# Patient Record
Sex: Female | Born: 1950 | Race: White | Hispanic: No | Marital: Married | Smoking: Former smoker
Health system: Southern US, Community
[De-identification: ages and names within clinical notes are randomized; demographics above are authoritative.]

## PROBLEM LIST (undated history)

## (undated) DIAGNOSIS — IMO0001 Reserved for inherently not codable concepts without codable children: Secondary | ICD-10-CM

## (undated) DIAGNOSIS — J449 Chronic obstructive pulmonary disease, unspecified: Secondary | ICD-10-CM

## (undated) DIAGNOSIS — B019 Varicella without complication: Secondary | ICD-10-CM

## (undated) DIAGNOSIS — I824Z1 Acute embolism and thrombosis of unspecified deep veins of right distal lower extremity: Secondary | ICD-10-CM

## (undated) DIAGNOSIS — A630 Anogenital (venereal) warts: Secondary | ICD-10-CM

## (undated) DIAGNOSIS — R918 Other nonspecific abnormal finding of lung field: Secondary | ICD-10-CM

## (undated) DIAGNOSIS — S83249A Other tear of medial meniscus, current injury, unspecified knee, initial encounter: Secondary | ICD-10-CM

## (undated) DIAGNOSIS — K635 Polyp of colon: Secondary | ICD-10-CM

## (undated) DIAGNOSIS — T8859XA Other complications of anesthesia, initial encounter: Secondary | ICD-10-CM

## (undated) DIAGNOSIS — N390 Urinary tract infection, site not specified: Secondary | ICD-10-CM

## (undated) DIAGNOSIS — M8580 Other specified disorders of bone density and structure, unspecified site: Secondary | ICD-10-CM

## (undated) DIAGNOSIS — T4145XA Adverse effect of unspecified anesthetic, initial encounter: Secondary | ICD-10-CM

## (undated) DIAGNOSIS — E079 Disorder of thyroid, unspecified: Secondary | ICD-10-CM

## (undated) HISTORY — DX: Varicella without complication: B01.9

## (undated) HISTORY — DX: Polyp of colon: K63.5

## (undated) HISTORY — DX: Anogenital (venereal) warts: A63.0

## (undated) HISTORY — PX: BREAST BIOPSY: SHX20

## (undated) HISTORY — PX: COLONOSCOPY: SHX174

## (undated) HISTORY — DX: Disorder of thyroid, unspecified: E07.9

## (undated) HISTORY — DX: Other specified disorders of bone density and structure, unspecified site: M85.80

## (undated) HISTORY — DX: Urinary tract infection, site not specified: N39.0

---
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
## 1996-11-22 HISTORY — PX: THYROIDECTOMY, PARTIAL: SHX18

## 2009-05-03 ENCOUNTER — Emergency Department (HOSPITAL_COMMUNITY): Admission: EM | Admit: 2009-05-03 | Discharge: 2009-05-03 | Payer: Self-pay | Admitting: Emergency Medicine

## 2014-05-14 ENCOUNTER — Other Ambulatory Visit: Payer: Self-pay | Admitting: Obstetrics and Gynecology

## 2014-05-14 DIAGNOSIS — N644 Mastodynia: Secondary | ICD-10-CM

## 2014-05-27 ENCOUNTER — Ambulatory Visit
Admission: RE | Admit: 2014-05-27 | Discharge: 2014-05-27 | Disposition: A | Discharge status: Home / Self Care | Payer: Medicare Other | Source: Ambulatory Visit | Attending: Obstetrics and Gynecology | Admitting: Obstetrics and Gynecology

## 2014-05-27 DIAGNOSIS — N644 Mastodynia: Secondary | ICD-10-CM

## 2014-05-31 ENCOUNTER — Other Ambulatory Visit: Payer: Self-pay | Admitting: Obstetrics and Gynecology

## 2014-05-31 DIAGNOSIS — R921 Mammographic calcification found on diagnostic imaging of breast: Secondary | ICD-10-CM

## 2014-06-03 ENCOUNTER — Other Ambulatory Visit: Payer: Self-pay

## 2014-06-03 ENCOUNTER — Other Ambulatory Visit: Payer: Self-pay | Admitting: Obstetrics and Gynecology

## 2014-06-03 DIAGNOSIS — R921 Mammographic calcification found on diagnostic imaging of breast: Secondary | ICD-10-CM

## 2014-06-06 ENCOUNTER — Ambulatory Visit
Admission: RE | Admit: 2014-06-06 | Discharge: 2014-06-06 | Disposition: A | Discharge status: Home / Self Care | Payer: Medicare Other | Source: Ambulatory Visit | Attending: Obstetrics and Gynecology | Admitting: Obstetrics and Gynecology

## 2014-06-06 DIAGNOSIS — R921 Mammographic calcification found on diagnostic imaging of breast: Secondary | ICD-10-CM

## 2014-07-01 ENCOUNTER — Other Ambulatory Visit: Payer: Self-pay | Admitting: Internal Medicine

## 2014-07-01 ENCOUNTER — Other Ambulatory Visit: Payer: Self-pay | Admitting: Obstetrics and Gynecology

## 2014-07-01 DIAGNOSIS — E041 Nontoxic single thyroid nodule: Secondary | ICD-10-CM

## 2014-07-02 LAB — CYTOLOGY - PAP

## 2014-07-04 ENCOUNTER — Other Ambulatory Visit: Payer: Medicare Other

## 2014-07-04 ENCOUNTER — Ambulatory Visit
Admission: RE | Admit: 2014-07-04 | Discharge: 2014-07-04 | Disposition: A | Discharge status: Home / Self Care | Payer: Medicare Other | Source: Ambulatory Visit | Attending: Internal Medicine | Admitting: Internal Medicine

## 2014-07-04 DIAGNOSIS — E041 Nontoxic single thyroid nodule: Secondary | ICD-10-CM

## 2014-08-13 ENCOUNTER — Encounter: Payer: Self-pay | Admitting: Internal Medicine

## 2014-08-20 ENCOUNTER — Ambulatory Visit (AMBULATORY_SURGERY_CENTER): Payer: Self-pay | Admitting: *Deleted

## 2014-08-20 VITALS — Ht 69.0 in | Wt 170.0 lb

## 2014-08-20 DIAGNOSIS — Z1211 Encounter for screening for malignant neoplasm of colon: Secondary | ICD-10-CM

## 2014-08-20 MED ORDER — MOVIPREP 100 G PO SOLR: ORAL | Status: DC

## 2014-08-20 NOTE — Progress Notes (Signed)
No allergies to eggs or soy. No problems with anesthesia.  No oxygen use  No diet drug use  

## 2014-08-29 ENCOUNTER — Encounter: Payer: Self-pay | Admitting: Internal Medicine

## 2014-09-02 ENCOUNTER — Ambulatory Visit (AMBULATORY_SURGERY_CENTER): Payer: Medicare Other | Admitting: Internal Medicine

## 2014-09-02 ENCOUNTER — Encounter: Payer: Self-pay | Admitting: Internal Medicine

## 2014-09-02 VITALS — BP 161/74 | HR 64 | Temp 98.0°F | Resp 50 | Ht 69.0 in | Wt 170.0 lb

## 2014-09-02 DIAGNOSIS — D122 Benign neoplasm of ascending colon: Secondary | ICD-10-CM

## 2014-09-02 DIAGNOSIS — Z1211 Encounter for screening for malignant neoplasm of colon: Secondary | ICD-10-CM

## 2014-09-02 MED ORDER — SODIUM CHLORIDE 0.9 % IV SOLN: 500.0000 mL | INTRAVENOUS | Status: DC

## 2014-09-02 NOTE — Progress Notes (Signed)
Procedure ends, to recovery, report given and VSS. 

## 2014-09-02 NOTE — Progress Notes (Signed)
Patient denies any allergies to eggs or soy. Patient denies any problems with anesthesia/sedation. Patient denies any oxygen use at home.

## 2014-09-02 NOTE — Op Note (Signed)
[facility]  [address] [place], [id number]   COLONOSCOPY PROCEDURE REPORT  PATIENT: Candace Frey, Candace Frey  MR#: [id number] BIRTHDATE: Sep 29, 1951 , 6  yrs. old GENDER: female ENDOSCOPIST: Jerene Bears, MD PROCEDURE DATE:  09/02/2014 PROCEDURE:   Colonoscopy with snare polypectomy First Screening Colonoscopy - Avg.  risk and is 50 yrs.  old or older - No.  Prior Negative Screening - Now for repeat screening. 10 or more years since last screening  History of Adenoma - Now for follow-up colonoscopy & has been > or = to 3 yrs.  N/A  Polyps Removed Today? Yes. ASA CLASS:   Class II INDICATIONS:average risk for colorectal cancer and last colonoscopy completed 2004 (normal). MEDICATIONS: Monitored anesthesia care and Propofol 240 mg IV  DESCRIPTION OF PROCEDURE:   After the risks benefits and alternatives of the procedure were thoroughly explained, informed consent was obtained.  The digital rectal exam revealed no rectal mass.   The LB [id number]-H190 [id number]  endoscope was introduced through the anus and advanced to the cecum, which was identified by both the appendix and ileocecal valve. No adverse events experienced. The quality of the prep was good, using MoviPrep  The instrument was then slowly withdrawn as the colon was fully examined.   COLON FINDINGS: A sessile polyp measuring 5 mm in size was found in the ascending colon.  A polypectomy was performed with a cold snare.  The resection was complete, the polyp tissue was completely retrieved and sent to histology.   There was mild diverticulosis noted in the sigmoid colon.  Retroflexed views revealed no abnormalities. The time to cecum=6 minutes 04 seconds.  Withdrawal time=8 minutes 05 seconds.  The scope was withdrawn and the procedure completed. COMPLICATIONS: There were no immediate complications.  ENDOSCOPIC IMPRESSION: 1.   Sessile polyp was found in the ascending colon; polypectomy was performed with a  cold snare 2.   Mild diverticulosis was noted in the sigmoid colon  RECOMMENDATIONS: 1.  Await pathology results 2.  If the polyp removed today is proven to be an adenomatous (pre-cancerous) polyp, you will need a repeat colonoscopy in 5 years.  Otherwise you should continue to follow colorectal cancer screening guidelines for "routine risk" patients with colonoscopy in 10 years.  You will receive a letter within 1-2 weeks with the results of your biopsy as well as final recommendations.  Please call my office if you have not received a letter after 3 weeks.  eSigned:  Jerene Bears, MD 09/02/2014 1:56 PM   cc: The Patient; Velna Hatchet, MD

## 2014-09-02 NOTE — Progress Notes (Signed)
Called to room to assist during endoscopic procedure.  Patient ID and intended procedure confirmed with present staff. Received instructions for my participation in the procedure from the performing physician.  

## 2014-09-02 NOTE — Patient Instructions (Signed)
Discharge instructions given with verbal understanding. Handouts on polyps and diverticulosis. Resume previous medications. YOU HAD AN ENDOSCOPIC PROCEDURE TODAY AT THE [facility]: Refer to the procedure report that was given to you for any specific questions about what was found during the examination.  If the procedure report does not answer your questions, please call your gastroenterologist to clarify.  If you requested that your care partner not be given the details of your procedure findings, then the procedure report has been included in a sealed envelope for you to review at your convenience later.  YOU SHOULD EXPECT: Some feelings of bloating in the abdomen. Passage of more gas than usual.  Walking can help get rid of the air that was put into your GI tract during the procedure and reduce the bloating. If you had a lower endoscopy (such as a colonoscopy or flexible sigmoidoscopy) you may notice spotting of blood in your stool or on the toilet paper. If you underwent a bowel prep for your procedure, then you may not have a normal bowel movement for a few days.  DIET: Your first meal following the procedure should be a light meal and then it is ok to progress to your normal diet.  A half-sandwich or bowl of soup is an example of a good first meal.  Heavy or fried foods are harder to digest and may make you feel nauseous or bloated.  Likewise meals heavy in dairy and vegetables can cause extra gas to form and this can also increase the bloating.  Drink plenty of fluids but you should avoid alcoholic beverages for 24 hours.  ACTIVITY: Your care partner should take you home directly after the procedure.  You should plan to take it easy, moving slowly for the rest of the day.  You can resume normal activity the day after the procedure however you should NOT DRIVE or use heavy machinery for 24 hours (because of the sedation medicines used during the test).    SYMPTOMS TO REPORT  IMMEDIATELY: A gastroenterologist can be reached at any hour.  During normal business hours, 8:30 AM to 5:00 PM Monday through Friday, call [phone].  After hours and on weekends, please call the GI answering service at [phone] who will take a message and have the physician on call contact you.   Following lower endoscopy (colonoscopy or flexible sigmoidoscopy):  Excessive amounts of blood in the stool  Significant tenderness or worsening of abdominal pains  Swelling of the abdomen that is new, acute  Fever of 100F or higher  FOLLOW UP: If any biopsies were taken you will be contacted by phone or by letter within the next 1-3 weeks.  Call your gastroenterologist if you have not heard about the biopsies in 3 weeks.  Our staff will call the home number listed on your records the next business day following your procedure to check on you and address any questions or concerns that you may have at that time regarding the information given to you following your procedure. This is a courtesy call and so if there is no answer at the home number and we have not heard from you through the emergency physician on call, we will assume that you have returned to your regular daily activities without incident.  SIGNATURES/CONFIDENTIALITY: You and/or your care partner have signed paperwork which will be entered into your electronic medical record.  These signatures attest to the fact that that the information above on your After Visit Summary   has been reviewed and is understood.  Full responsibility of the confidentiality of this discharge information lies with you and/or your care-partner. 

## 2014-09-03 ENCOUNTER — Telehealth: Payer: Self-pay

## 2014-09-03 NOTE — Telephone Encounter (Signed)
  Follow up Call-  Call back number 09/02/2014  Post procedure Call Back phone  # [phone]  Permission to leave phone message Yes     Patient questions:  Do you have a fever, pain , or abdominal swelling? No. Pain Score  0 *  Have you tolerated food without any problems? Yes.    Have you been able to return to your normal activities? Yes.    Do you have any questions about your discharge instructions: Diet   No. Medications  No. Follow up visit  No.  Do you have questions or concerns about your Care? No.  Actions: * If pain score is 4 or above: No action needed, pain <4.

## 2014-09-09 ENCOUNTER — Encounter: Payer: Self-pay | Admitting: Internal Medicine

## 2014-12-03 ENCOUNTER — Ambulatory Visit: Payer: Self-pay | Admitting: Orthopedic Surgery

## 2014-12-03 NOTE — Progress Notes (Signed)
Preoperative surgical orders have been place into the Epic hospital system for Candace Frey on 12/03/2014, 1:44 PM  by Mickel Crow for surgery on 01-29-2015.  Preop Knee Scope orders including IV Tylenol and IV Decadron as long as there are no contraindications to the above medications. Arlee Muslim, PA-C

## 2015-01-21 NOTE — Patient Instructions (Addendum)
LISET MCMONIGLE  01/21/2015   Your procedure is scheduled on: 01/29/2015    Report to [facility] Main  Entrance and follow signs to               [place] at      [place] AM.  Call this number if you have problems the morning of surgery [phone]   Remember:  Do not eat food or drink liquids :After Midnight.     Take these medicines the morning of surgery with A SIP OF WATER: synthroid                                You may not have any metal on your body including hair pins and              piercings  Do not wear jewelry, make-up, lotions, powders or perfumes., deodorant.               Do not wear nail polish.  Do not shave  48 hours prior to surgery.                 Do not bring valuables to the hospital. [place].  Contacts, dentures or bridgework may not be worn into surgery.       Patients discharged the day of surgery will not be allowed to drive home.  Name and phone number of your driver:  Special Instructions: coughing and deep breathing exercises, leg exercises               Please read over the following fact sheets you were given: _____________________________________________________________________             [facility] - Preparing for Surgery Before surgery, you can play an important role.  Because skin is not sterile, your skin needs to be as free of germs as possible.  You can reduce the number of germs on your skin by washing with CHG (chlorahexidine gluconate) soap before surgery.  CHG is an antiseptic cleaner which kills germs and bonds with the skin to continue killing germs even after washing. Please DO NOT use if you have an allergy to CHG or antibacterial soaps.  If your skin becomes reddened/irritated stop using the CHG and inform your nurse when you arrive at Short Stay. Do not shave (including legs and underarms) for at least 48 hours prior to the first CHG shower.   You may shave your face/neck. Please follow these instructions carefully:  1.  Shower with CHG Soap the night before surgery and the  morning of Surgery.  2.  If you choose to wash your hair, wash your hair first as usual with your  normal  shampoo.  3.  After you shampoo, rinse your hair and body thoroughly to remove the  shampoo.                           4.  Use CHG as you would any other liquid soap.  You can apply chg directly  to the skin and wash                       Gently with  a scrungie or clean washcloth.  5.  Apply the CHG Soap to your body ONLY FROM THE NECK DOWN.   Do not use on face/ open                           Wound or open sores. Avoid contact with eyes, ears mouth and genitals (private parts).                       Wash face,  Genitals (private parts) with your normal soap.             6.  Wash thoroughly, paying special attention to the area where your surgery  will be performed.  7.  Thoroughly rinse your body with warm water from the neck down.  8.  DO NOT shower/wash with your normal soap after using and rinsing off  the CHG Soap.                9.  Pat yourself dry with a clean towel.            10.  Wear clean pajamas.            11.  Place clean sheets on your bed the night of your first shower and do not  sleep with pets. Day of Surgery : Do not apply any lotions/deodorants the morning of surgery.  Please wear clean clothes to the hospital/surgery center.  FAILURE TO FOLLOW THESE INSTRUCTIONS MAY RESULT IN THE CANCELLATION OF YOUR SURGERY PATIENT SIGNATURE_________________________________  NURSE SIGNATURE__________________________________  ________________________________________________________________________

## 2015-01-23 ENCOUNTER — Encounter (HOSPITAL_COMMUNITY): Payer: Self-pay

## 2015-01-23 ENCOUNTER — Encounter (HOSPITAL_COMMUNITY)
Admission: RE | Admit: 2015-01-23 | Discharge: 2015-01-23 | Disposition: A | Discharge status: Home / Self Care | Payer: Medicare Other | Source: Ambulatory Visit | Attending: Orthopedic Surgery | Admitting: Orthopedic Surgery

## 2015-01-23 DIAGNOSIS — Z01812 Encounter for preprocedural laboratory examination: Secondary | ICD-10-CM | POA: Insufficient documentation

## 2015-01-23 HISTORY — DX: Adverse effect of unspecified anesthetic, initial encounter: T41.45XA

## 2015-01-23 HISTORY — DX: Other complications of anesthesia, initial encounter: T88.59XA

## 2015-01-23 HISTORY — DX: Reserved for inherently not codable concepts without codable children: IMO0001

## 2015-01-23 LAB — CBC
HCT: 40.4 % (ref 36.0–46.0)
Hemoglobin: 13.3 g/dL (ref 12.0–15.0)
MCH: 31.4 pg (ref 26.0–34.0)
MCHC: 32.9 g/dL (ref 30.0–36.0)
MCV: 95.5 fL (ref 78.0–100.0)
PLATELETS: 221 10*3/uL (ref 150–400)
RBC: 4.23 MIL/uL (ref 3.87–5.11)
RDW: 12.8 % (ref 11.5–15.5)
WBC: 5 10*3/uL (ref 4.0–10.5)

## 2015-01-23 NOTE — Progress Notes (Signed)
CXR- 07/01/2014 on chart

## 2015-01-24 NOTE — Progress Notes (Signed)
LOV from PCP- Dr Ardeth Perfect 07/01/2014 on chart .

## 2015-01-28 NOTE — Anesthesia Preprocedure Evaluation (Addendum)
Anesthesia Evaluation  Patient identified by MRN, date of birth, ID band Patient awake    Reviewed: Allergy & Precautions, H&P , NPO status , Patient's Chart, lab work & pertinent test results  History of Anesthesia Complications (+) PROLONGED EMERGENCE  Airway Mallampati: II  TM Distance: >3 FB Neck ROM: full    Dental no notable dental hx. (+) Teeth Intact, Dental Advisory Given   Pulmonary shortness of breath and with exertion, former smoker,  breath sounds clear to auscultation  Pulmonary exam normal       Cardiovascular Exercise Tolerance: Good negative cardio ROS  Rhythm:regular Rate:Normal     Neuro/Psych negative neurological ROS  negative psych ROS   GI/Hepatic negative GI ROS, Neg liver ROS,   Endo/Other  negative endocrine ROS  Renal/GU negative Renal ROS  negative genitourinary   Musculoskeletal   Abdominal   Peds  Hematology negative hematology ROS (+)   Anesthesia Other Findings   Reproductive/Obstetrics negative OB ROS                            Anesthesia Physical Anesthesia Plan  ASA: II  Anesthesia Plan: General   Post-op Pain Management:    Induction: Intravenous  Airway Management Planned: LMA  Additional Equipment:   Intra-op Plan:   Post-operative Plan:   Informed Consent: I have reviewed the patients History and Physical, chart, labs and discussed the procedure including the risks, benefits and alternatives for the proposed anesthesia with the patient or authorized representative who has indicated his/her understanding and acceptance.   Dental Advisory Given  Plan Discussed with: CRNA and Surgeon  Anesthesia Plan Comments:        Anesthesia Quick Evaluation

## 2015-01-29 ENCOUNTER — Encounter (HOSPITAL_COMMUNITY)
Admission: RE | Disposition: A | Discharge status: Home / Self Care | Payer: Self-pay | Source: Ambulatory Visit | Attending: Orthopedic Surgery

## 2015-01-29 ENCOUNTER — Ambulatory Visit (HOSPITAL_COMMUNITY)
Admission: RE | Admit: 2015-01-29 | Discharge: 2015-01-29 | Disposition: A | Discharge status: Home / Self Care | Payer: Medicare Other | Source: Ambulatory Visit | Attending: Orthopedic Surgery | Admitting: Orthopedic Surgery

## 2015-01-29 ENCOUNTER — Encounter (HOSPITAL_COMMUNITY): Payer: Self-pay | Admitting: *Deleted

## 2015-01-29 ENCOUNTER — Ambulatory Visit (HOSPITAL_COMMUNITY): Payer: Medicare Other | Admitting: Anesthesiology

## 2015-01-29 DIAGNOSIS — Z79899 Other long term (current) drug therapy: Secondary | ICD-10-CM | POA: Insufficient documentation

## 2015-01-29 DIAGNOSIS — X58XXXA Exposure to other specified factors, initial encounter: Secondary | ICD-10-CM | POA: Diagnosis not present

## 2015-01-29 DIAGNOSIS — Z9089 Acquired absence of other organs: Secondary | ICD-10-CM | POA: Diagnosis not present

## 2015-01-29 DIAGNOSIS — Y999 Unspecified external cause status: Secondary | ICD-10-CM | POA: Insufficient documentation

## 2015-01-29 DIAGNOSIS — S83249A Other tear of medial meniscus, current injury, unspecified knee, initial encounter: Secondary | ICD-10-CM

## 2015-01-29 DIAGNOSIS — Y939 Activity, unspecified: Secondary | ICD-10-CM | POA: Diagnosis not present

## 2015-01-29 DIAGNOSIS — S83241D Other tear of medial meniscus, current injury, right knee, subsequent encounter: Secondary | ICD-10-CM

## 2015-01-29 DIAGNOSIS — Z87891 Personal history of nicotine dependence: Secondary | ICD-10-CM | POA: Diagnosis not present

## 2015-01-29 DIAGNOSIS — J449 Chronic obstructive pulmonary disease, unspecified: Secondary | ICD-10-CM | POA: Diagnosis not present

## 2015-01-29 DIAGNOSIS — M94261 Chondromalacia, right knee: Secondary | ICD-10-CM | POA: Insufficient documentation

## 2015-01-29 DIAGNOSIS — Y929 Unspecified place or not applicable: Secondary | ICD-10-CM | POA: Diagnosis not present

## 2015-01-29 DIAGNOSIS — S83241A Other tear of medial meniscus, current injury, right knee, initial encounter: Secondary | ICD-10-CM | POA: Insufficient documentation

## 2015-01-29 DIAGNOSIS — Z791 Long term (current) use of non-steroidal anti-inflammatories (NSAID): Secondary | ICD-10-CM | POA: Insufficient documentation

## 2015-01-29 HISTORY — PX: KNEE ARTHROSCOPY: SHX127

## 2015-01-29 HISTORY — DX: Other tear of medial meniscus, current injury, unspecified knee, initial encounter: S83.249A

## 2015-01-29 SURGERY — ARTHROSCOPY, KNEE
Anesthesia: General | Site: Knee | Laterality: Right

## 2015-01-29 MED ORDER — FENTANYL CITRATE 0.05 MG/ML IJ SOLN
INTRAMUSCULAR | Status: AC
  Filled 2015-01-29: qty 2

## 2015-01-29 MED ORDER — LIDOCAINE HCL (CARDIAC) 20 MG/ML IV SOLN
INTRAVENOUS | Status: DC | PRN
  Administered 2015-01-29: 100 mg via INTRAVENOUS

## 2015-01-29 MED ORDER — SODIUM CHLORIDE 0.9 % IV SOLN: INTRAVENOUS | Status: DC

## 2015-01-29 MED ORDER — HYDROMORPHONE HCL 1 MG/ML IJ SOLN
0.2500 mg | INTRAMUSCULAR | Status: DC | PRN
  Administered 2015-01-29: 0.5 mg via INTRAVENOUS

## 2015-01-29 MED ORDER — DEXAMETHASONE SODIUM PHOSPHATE 10 MG/ML IJ SOLN: 10.0000 mg | Freq: Once | INTRAMUSCULAR | Status: DC

## 2015-01-29 MED ORDER — MIDAZOLAM HCL 2 MG/2ML IJ SOLN
INTRAMUSCULAR | Status: AC
  Filled 2015-01-29: qty 2

## 2015-01-29 MED ORDER — LACTATED RINGERS IV SOLN
INTRAVENOUS | Status: DC | PRN
  Administered 2015-01-29: 10:00:00 via INTRAVENOUS

## 2015-01-29 MED ORDER — BUPIVACAINE-EPINEPHRINE (PF) 0.25% -1:200000 IJ SOLN
INTRAMUSCULAR | Status: AC
  Filled 2015-01-29: qty 30

## 2015-01-29 MED ORDER — MIDAZOLAM HCL 5 MG/5ML IJ SOLN
INTRAMUSCULAR | Status: DC | PRN
  Administered 2015-01-29: 2 mg via INTRAVENOUS

## 2015-01-29 MED ORDER — METHOCARBAMOL 500 MG PO TABS: 500.0000 mg | ORAL_TABLET | Freq: Four times a day (QID) | ORAL | Status: DC

## 2015-01-29 MED ORDER — ONDANSETRON HCL 4 MG/2ML IJ SOLN
INTRAMUSCULAR | Status: DC | PRN
Start: 2015-01-29 — End: 2015-01-29
  Administered 2015-01-29: 4 mg via INTRAVENOUS

## 2015-01-29 MED ORDER — LACTATED RINGERS IR SOLN
Status: DC | PRN
  Administered 2015-01-29 (×3): 3000 mL

## 2015-01-29 MED ORDER — CEFAZOLIN SODIUM-DEXTROSE 2-3 GM-% IV SOLR
2.0000 g | INTRAVENOUS | Status: AC
  Administered 2015-01-29: 2 g via INTRAVENOUS

## 2015-01-29 MED ORDER — LIDOCAINE HCL (CARDIAC) 20 MG/ML IV SOLN
INTRAVENOUS | Status: AC
  Filled 2015-01-29: qty 5

## 2015-01-29 MED ORDER — BUPIVACAINE-EPINEPHRINE 0.25% -1:200000 IJ SOLN
INTRAMUSCULAR | Status: DC | PRN
  Administered 2015-01-29: 20 mL

## 2015-01-29 MED ORDER — PROPOFOL 10 MG/ML IV BOLUS
INTRAVENOUS | Status: DC | PRN
  Administered 2015-01-29: 160 mg via INTRAVENOUS

## 2015-01-29 MED ORDER — FENTANYL CITRATE 0.05 MG/ML IJ SOLN
INTRAMUSCULAR | Status: DC | PRN
  Administered 2015-01-29 (×2): 50 ug via INTRAVENOUS

## 2015-01-29 MED ORDER — HYDROMORPHONE HCL 1 MG/ML IJ SOLN
INTRAMUSCULAR | Status: AC
  Filled 2015-01-29: qty 1

## 2015-01-29 MED ORDER — CEFAZOLIN SODIUM-DEXTROSE 2-3 GM-% IV SOLR
INTRAVENOUS | Status: AC
  Filled 2015-01-29: qty 50

## 2015-01-29 MED ORDER — HYDROCODONE-ACETAMINOPHEN 5-325 MG PO TABS: 1.0000 | ORAL_TABLET | ORAL | Status: DC | PRN

## 2015-01-29 MED ORDER — CHLORHEXIDINE GLUCONATE 4 % EX LIQD: 60.0000 mL | Freq: Once | CUTANEOUS | Status: DC

## 2015-01-29 MED ORDER — HYDROCODONE-ACETAMINOPHEN 5-325 MG PO TABS
1.0000 | ORAL_TABLET | ORAL | Status: DC | PRN
  Administered 2015-01-29: 2 via ORAL
  Filled 2015-01-29: qty 2

## 2015-01-29 MED ORDER — FENTANYL CITRATE 0.05 MG/ML IJ SOLN
25.0000 ug | INTRAMUSCULAR | Status: DC | PRN
  Administered 2015-01-29 (×2): 50 ug via INTRAVENOUS

## 2015-01-29 MED ORDER — LACTATED RINGERS IV SOLN
INTRAVENOUS | Status: DC
  Administered 2015-01-29: 12:00:00 via INTRAVENOUS

## 2015-01-29 MED ORDER — METHOCARBAMOL 500 MG PO TABS
500.0000 mg | ORAL_TABLET | Freq: Four times a day (QID) | ORAL | Status: DC | PRN
Start: 2015-01-29 — End: 2015-01-29
  Administered 2015-01-29: 500 mg via ORAL
  Filled 2015-01-29: qty 1

## 2015-01-29 MED ORDER — ACETAMINOPHEN 10 MG/ML IV SOLN
1000.0000 mg | Freq: Once | INTRAVENOUS | Status: DC
  Filled 2015-01-29: qty 100

## 2015-01-29 MED ORDER — PROPOFOL 10 MG/ML IV BOLUS
INTRAVENOUS | Status: AC
  Filled 2015-01-29: qty 20

## 2015-01-29 MED ORDER — DEXAMETHASONE SODIUM PHOSPHATE 10 MG/ML IJ SOLN
INTRAMUSCULAR | Status: DC | PRN
  Administered 2015-01-29: 10 mg via INTRAVENOUS

## 2015-01-29 SURGICAL SUPPLY — 29 items
BANDAGE ELASTIC 6 VELCRO ST LF (GAUZE/BANDAGES/DRESSINGS) ×4 IMPLANT
BLADE 4.2CUDA (BLADE) ×2 IMPLANT
COVER SURGICAL LIGHT HANDLE (MISCELLANEOUS) ×2 IMPLANT
CUFF TOURN SGL QUICK 34 (TOURNIQUET CUFF) ×1
CUFF TRNQT CYL 34X4X40X1 (TOURNIQUET CUFF) ×1 IMPLANT
DRAPE U-SHAPE 47X51 STRL (DRAPES) ×2 IMPLANT
DRSG EMULSION OIL 3X3 NADH (GAUZE/BANDAGES/DRESSINGS) ×2 IMPLANT
DRSG PAD ABDOMINAL 8X10 ST (GAUZE/BANDAGES/DRESSINGS) ×2 IMPLANT
DURAPREP 26ML APPLICATOR (WOUND CARE) ×2 IMPLANT
GAUZE SPONGE 4X4 12PLY STRL (GAUZE/BANDAGES/DRESSINGS) ×2 IMPLANT
GAUZE SPONGE 4X4 16PLY XRAY LF (GAUZE/BANDAGES/DRESSINGS) ×2 IMPLANT
GLOVE BIO SURGEON STRL SZ8 (GLOVE) ×2 IMPLANT
GLOVE BIOGEL PI IND STRL 8 (GLOVE) ×1 IMPLANT
GLOVE BIOGEL PI INDICATOR 8 (GLOVE) ×1
GOWN STRL REUS W/TWL LRG LVL3 (GOWN DISPOSABLE) ×2 IMPLANT
KIT BASIN OR (CUSTOM PROCEDURE TRAY) ×2 IMPLANT
MANIFOLD NEPTUNE II (INSTRUMENTS) ×2 IMPLANT
MARKER PEN SURG W/LABELS BLK (STERILIZATION PRODUCTS) ×2 IMPLANT
PACK ARTHROSCOPY WL (CUSTOM PROCEDURE TRAY) ×2 IMPLANT
PACK ICE MAXI GEL EZY WRAP (MISCELLANEOUS) ×6 IMPLANT
PAD ABD 8X10 STRL (GAUZE/BANDAGES/DRESSINGS) ×2 IMPLANT
PADDING CAST COTTON 6X4 STRL (CAST SUPPLIES) ×4 IMPLANT
POSITIONER SURGICAL ARM (MISCELLANEOUS) ×2 IMPLANT
SET ARTHROSCOPY TUBING (MISCELLANEOUS) ×1
SET ARTHROSCOPY TUBING LN (MISCELLANEOUS) ×1 IMPLANT
SUT ETHILON 4 0 PS 2 18 (SUTURE) ×2 IMPLANT
TOWEL OR 17X26 10 PK STRL BLUE (TOWEL DISPOSABLE) ×2 IMPLANT
WAND 90 DEG TURBOVAC W/CORD (SURGICAL WAND) ×2 IMPLANT
WRAP KNEE MAXI GEL POST OP (GAUZE/BANDAGES/DRESSINGS) ×2 IMPLANT

## 2015-01-29 NOTE — Interval H&P Note (Signed)
History and Physical Interval Note:  01/29/2015 9:44 AM  Candace Frey  has presented today for surgery, with the diagnosis of RIGHT KNEE MEDIAL MENISCUS TEAR  The various methods of treatment have been discussed with the patient and family. After consideration of risks, benefits and other options for treatment, the patient has consented to  Procedure(s): RIGHT KNEE ARTHROSCOPY WITH DEBRIDEMENT   (Right) as a surgical intervention .  The patient's history has been reviewed, patient examined, no change in status, stable for surgery.  I have reviewed the patient's chart and labs.  Questions were answered to the patient's satisfaction.     Gearlean Alf

## 2015-01-29 NOTE — Op Note (Signed)
Preoperative diagnosis-  Right knee medial meniscal tear  Postoperative diagnosis Right- knee medial meniscal tear plus    Procedure- Right knee arthroscopy with medial  meniscal debridement    Surgeon- Candace Plover. Garmon Dehn, MD  Anesthesia-General  EBL-  Minimal  Complications- None  Condition- PACU - hemodynamically stable.  Brief clinical note- -Candace Frey is a 64 y.o.  female with a several month history of right knee pain and mechanical symptoms. Exam and history suggested medial meniscal tear confirmed by MRI. The patient presents now for arthroscopy and debridement   Procedure in detail -       After successful administration of General anesthetic, a tourmiquet is placed high on the Right  thigh and the Right lower extremity is prepped and draped in the usual sterile fashion. Time out is performed by the surgical team. Standard superomedial and inferolateral portal sites are marked and incisions made with an 11 blade. The inflow cannula is passed through the superomedial portal and camera through the inferolateral portal and inflow is initiated. Arthroscopic visualization proceeds.      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 The medial and lateral gutters are visualized and there are  no loose bodies. Flexion and valgus force is applied to the knee and the medial compartment is entered. A spinal needle is passed into the joint through the site marked for the inferomedial portal. A small incision is made and the dilator passed into the joint. The findings for the medial compartment are unstable bucket handle tear of the medial meniscus without any chondral abnormalities . The tear is debrided to a stable base with baskets and a shaver and sealed off with the Arthrocare.  It is probed and found to be stable.    The intercondylar notch is visualized and the ACL appears normal. The lateral  compartment is entered and the findings are normal .      The joint is again inspected and there are no other tears, defects or loose bodies identified. The arthroscopic equipment is then removed from the inferior portals which are closed with interrupted 4-0 nylon. 20 ml of .25% Marcaine with epinephrine are injected through the inflow cannula and the cannula is then removed and the portal closed with nylon. The incisions are cleaned and dried and a bulky sterile dressing is applied. The patient is then awakened and transported to recovery in stable condition.   01/29/2015, 10:45 AM

## 2015-01-29 NOTE — Anesthesia Postprocedure Evaluation (Signed)
  Anesthesia Post-op Note  Patient: Candace Frey  Procedure(s) Performed: Procedure(s) (LRB): RIGHT KNEE ARTHROSCOPY WITH DEBRIDEMENT   (Right)  Patient Location: PACU  Anesthesia Type: General  Level of Consciousness: awake and alert   Airway and Oxygen Therapy: Patient Spontanous Breathing  Post-op Pain: mild  Post-op Assessment: Post-op Vital signs reviewed, Patient's Cardiovascular Status Stable, Respiratory Function Stable, Patent Airway and No signs of Nausea or vomiting  Last Vitals:  Filed Vitals:   01/29/15 1215  BP: 124/54  Pulse: 61  Temp: 36.7 C  Resp: 14    Post-op Vital Signs: stable   Complications: No apparent anesthesia complications

## 2015-01-29 NOTE — Progress Notes (Signed)
Dr. Landry Dyke made aware of patient's blood pressures and heart rates (being as low as 47-49 in PACU)-

## 2015-01-29 NOTE — Discharge Instructions (Signed)
Arthroscopic Procedure, Knee °An arthroscopic procedure can find what is wrong with your knee. °PROCEDURE °Arthroscopy is a surgical technique that allows your orthopedic surgeon to diagnose and treat your knee injury with accuracy. They will look into your knee through a small instrument. This is almost like a small (pencil sized) telescope. Because arthroscopy affects your knee less than open knee surgery, you can anticipate a more rapid recovery. Taking an active role by following your caregiver's instructions will help with rapid and complete recovery. Use crutches, rest, elevation, ice, and knee exercises as instructed. The length of recovery depends on various factors including type of injury, age, physical condition, medical conditions, and your rehabilitation. °Your knee is the joint between the large bones (femur and tibia) in your leg. Cartilage covers these bone ends which are smooth and slippery and allow your knee to bend and move smoothly. Two menisci, thick, semi-lunar shaped pads of cartilage which form a rim inside the joint, help absorb shock and stabilize your knee. Ligaments bind the bones together and support your knee joint. Muscles move the joint, help support your knee, and take stress off the joint itself. Because of this all programs and physical therapy to rehabilitate an injured or repaired knee require rebuilding and strengthening your muscles. °AFTER THE PROCEDURE °· After the procedure, you will be moved to a recovery area until most of the effects of the medication have worn off. Your caregiver will discuss the test results with you.  °· Only take over-the-counter or prescription medicines for pain, discomfort, or fever as directed by your caregiver.  °SEEK MEDICAL CARE IF:  °· You have increased bleeding from your wounds.  °· You see redness, swelling, or have increasing pain in your wounds.  °· You have pus coming from your wound.  °· You have an oral temperature above 102° F (38.9°  C).  °· You notice a bad smell coming from the wound or dressing.  °· You have severe pain with any motion of your knee.  °SEEK IMMEDIATE MEDICAL CARE IF:  °· You develop a rash.  °· You have difficulty breathing.  °· You have any allergic problems.  °FURTHER INSTRUCTIONS: °· You may start showering two days after being discharged home but do not submerge the incisions under water.  °· Change dressing 48 hours after the procedure and then cover the small incisions with band aids until your follow up visit. °· Avoid periods of inactivity such as sitting longer than an hour when not asleep. This helps prevent blood clots.  °· You may put full weight on your legs and walk as much as is comfortable.  °· Do not drive while taking narcotics.  °Wear the elastic stockings for three weeks following surgery during the day but you may remove then at night. °· Make sure you keep all of your appointments after your operation with all of your doctors and caregivers. You should call the office at [phone] and make an appointment for approximately one week after the date of your surgery. °· Please pick up a stool softener and laxative for home use as long as you are requiring pain medications. °· ICE to the affected knee every three hours for 30 minutes at a time and then as needed for pain and swelling.  Continue to use ice on the knee for pain and swelling from surgery. You may notice swelling that will progress down to the foot and ankle.  This is normal after surgery.  Elevate the   leg when you are not up walking on it.   RANGE OF MOTION AND STRENGTHENING EXERCISES  Rehabilitation of the knee is important following a knee injury or an operation. After just a few days of immobilization, the muscles of the thigh which control the knee become weakened and shrink (atrophy). Knee exercises are designed to build up the tone and strength of the thigh muscles and to improve knee motion. Often times heat used for twenty to thirty  minutes before working out will loosen up your tissues and help with improving the range of motion but do not use heat for the first two weeks following surgery. These exercises can be done on a training (exercise) mat, on the floor, on a table or on a bed. Use what ever works the best and is most comfortable for you Knee exercises include:       QUAD STRENGTHENING EXERCISES Strengthening Quadriceps Sets  Tighten muscles on top of thigh by pushing knees down into floor or table. Hold for 20 seconds. Repeat 10 times. Do 2 sessions per day.    Strengthening Terminal Knee Extension  With knee bent over bolster, straighten knee by tightening muscle on top of thigh. Be sure to keep bottom of knee on bolster. Hold for 20 seconds. Repeat 10 times. Do 2 sessions per day.   Straight Leg with Bent Knee  Lie on back with opposite leg bent. Keep involved knee slightly bent at knee and raise leg 4-6". Hold for 10 seconds. Repeat 20 times per set. Do 2 sets per session. Do 2 sessions per day.    General Anesthesia, Care After Refer to this sheet in the next few weeks. These instructions provide you with information on caring for yourself after your procedure. Your health care provider may also give you more specific instructions. Your treatment has been planned according to current medical practices, but problems sometimes occur. Call your health care provider if you have any problems or questions after your procedure. WHAT TO EXPECT AFTER THE PROCEDURE After the procedure, it is typical to experience:  Sleepiness.  Nausea and vomiting. HOME CARE INSTRUCTIONS  For the first 24 hours after general anesthesia:  Have a responsible person with you.  Do not drive a car. If you are alone, do not take public transportation.  Do not drink alcohol.  Do not take medicine that has not been prescribed by your health care provider.  Do not sign important papers or make important  decisions.  You may resume a normal diet and activities as directed by your health care provider.  Change bandages (dressings) as directed.  If you have questions or problems that seem related to general anesthesia, call the hospital and ask for the anesthetist or anesthesiologist on call. SEEK MEDICAL CARE IF:  You have nausea and vomiting that continue the day after anesthesia.  You develop a rash. SEEK IMMEDIATE MEDICAL CARE IF:   You have difficulty breathing.  You have chest pain.  You have any allergic problems. Document Released: 02/14/2001 Document Revised: 11/13/2013 Document Reviewed: 05/24/2013 [facility] Patient Information 2015 [place], [place]. This information is not intended to replace advice given to you by your health care provider. Make sure you discuss any questions you have with your health care provider.

## 2015-01-29 NOTE — H&P (Signed)
  CC- Candace Frey is a 64 y.o. female who presents with right knee pain.  HPI- . Knee Pain: Patient presents with knee pain involving the  right knee. Onset of the symptoms was several months ago. Inciting event: none known. Current symptoms include giving out, pain located medially and stiffness. Pain is aggravated by lateral movements, pivoting, rising after sitting and walking.  Patient has had no prior knee problems. Evaluation to date: MRI: abnormal medial meniscal tear. Treatment to date: rest.  Past Medical History  Diagnosis Date  . Thyroid disease   . Complication of anesthesia     slow to wake up   . Shortness of breath dyspnea     with exertion was a smoker for many year per patient     Past Surgical History  Procedure Laterality Date  . Thyroidectomy, partial  1998    Prior to Admission medications   Medication Sig Start Date End Date Taking? Authorizing Provider  Calcium Carbonate-Vitamin D (CALCIUM + D PO) Take 1 tablet by mouth daily.    Yes Historical Provider, MD  estrogen, conjugated,-medroxyprogesterone (PREMPRO) 0.3-1.5 MG per tablet Take 1 tablet by mouth at bedtime.   Yes Historical Provider, MD  ibuprofen (ADVIL,MOTRIN) 200 MG tablet Take 400 mg by mouth every 6 (six) hours as needed for mild pain.   Yes Historical Provider, MD  levothyroxine (SYNTHROID, LEVOTHROID) 100 MCG tablet Take 100 mcg by mouth daily before breakfast.   Yes Historical Provider, MD  Multiple Vitamins-Minerals (PRESERVISION AREDS 2 PO) Take 1 tablet by mouth 2 (two) times daily.   Yes Historical Provider, MD  TURMERIC PO Take 1 tablet by mouth daily.    Yes Historical Provider, MD  Vitamin D, Cholecalciferol, 1000 UNITS TABS Take 1 tablet by mouth daily.    Yes Historical Provider, MD   KNEE EXAM antalgic gait, soft tissue tenderness over medial joint line, no effusion, negative drawer sign, collateral ligaments intact  Physical Examination: General appearance - alert, well  appearing, and in no distress Mental status - alert, oriented to person, place, and time Chest - clear to auscultation, no wheezes, rales or rhonchi, symmetric air entry Heart - normal rate, regular rhythm, normal S1, S2, no murmurs, rubs, clicks or gallops Abdomen - soft, nontender, nondistended, no masses or organomegaly Neurological - alert, oriented, normal speech, no focal findings or movement disorder noted   Asessment/Plan--- Right knee medial meniscal tear- - Plan Right knee arthroscopy with meniscal debridement. Procedure risks and potential comps discussed with patient who elects to proceed. Goals are decreased pain and increased function with a high likelihood of achieving both

## 2015-01-29 NOTE — Transfer of Care (Signed)
Immediate Anesthesia Transfer of Care Note  Patient: Candace Frey  Procedure(s) Performed: Procedure(s): RIGHT KNEE ARTHROSCOPY WITH DEBRIDEMENT   (Right)  Patient Location: PACU  Anesthesia Type:General  Level of Consciousness: sedated  Airway & Oxygen Therapy: Patient Spontanous Breathing and Patient connected to face mask oxygen  Post-op Assessment: Report given to RN and Post -op Vital signs reviewed and stable  Post vital signs: Reviewed and stable  Last Vitals:  Filed Vitals:   01/29/15 0846  BP: 117/68  Pulse: 88  Temp: 36.6 C  Resp: 16    Complications: No apparent anesthesia complications

## 2015-01-30 ENCOUNTER — Encounter (HOSPITAL_COMMUNITY): Payer: Self-pay | Admitting: Orthopedic Surgery

## 2015-04-11 ENCOUNTER — Other Ambulatory Visit (HOSPITAL_COMMUNITY): Payer: Self-pay | Admitting: Orthopedic Surgery

## 2015-04-11 ENCOUNTER — Ambulatory Visit (HOSPITAL_COMMUNITY)
Admission: RE | Admit: 2015-04-11 | Discharge: 2015-04-11 | Disposition: A | Discharge status: Home / Self Care | Payer: Medicare Other | Source: Ambulatory Visit | Attending: Internal Medicine | Admitting: Internal Medicine

## 2015-04-11 DIAGNOSIS — M79605 Pain in left leg: Secondary | ICD-10-CM | POA: Insufficient documentation

## 2015-04-11 DIAGNOSIS — I82819 Embolism and thrombosis of superficial veins of unspecified lower extremities: Secondary | ICD-10-CM | POA: Insufficient documentation

## 2015-04-11 DIAGNOSIS — M7989 Other specified soft tissue disorders: Secondary | ICD-10-CM | POA: Insufficient documentation

## 2015-04-11 DIAGNOSIS — R609 Edema, unspecified: Secondary | ICD-10-CM | POA: Diagnosis not present

## 2015-04-11 DIAGNOSIS — M79604 Pain in right leg: Secondary | ICD-10-CM | POA: Diagnosis not present

## 2015-04-11 NOTE — Progress Notes (Signed)
*  Preliminary Results* Bilateral lower extremity venous duplex completed. 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 There is also evidence of thrombosis involving a superficial vein of the anterior right foot/ankle. The left lower extremity is negative for deep vein thrombosis. There is evidence of a right Baker's cyst, no evidence of a left Baker's cyst.  Preliminary results discussed with Dr. Ardeth Perfect.  04/11/2015  Maudry Mayhew, RVT, RDCS, RDMS

## 2015-06-11 ENCOUNTER — Other Ambulatory Visit: Payer: Self-pay | Admitting: Internal Medicine

## 2015-06-11 DIAGNOSIS — E041 Nontoxic single thyroid nodule: Secondary | ICD-10-CM

## 2015-06-18 ENCOUNTER — Other Ambulatory Visit: Payer: Medicare Other

## 2015-06-30 ENCOUNTER — Other Ambulatory Visit: Payer: Medicare Other

## 2015-07-07 ENCOUNTER — Other Ambulatory Visit: Payer: Medicare Other

## 2015-09-08 ENCOUNTER — Ambulatory Visit
Admission: RE | Admit: 2015-09-08 | Discharge: 2015-09-08 | Disposition: A | Discharge status: Home / Self Care | Payer: Medicare Other | Source: Ambulatory Visit | Attending: Internal Medicine | Admitting: Internal Medicine

## 2015-09-08 DIAGNOSIS — E041 Nontoxic single thyroid nodule: Secondary | ICD-10-CM

## 2015-10-15 ENCOUNTER — Other Ambulatory Visit (HOSPITAL_COMMUNITY): Payer: Self-pay | Admitting: Internal Medicine

## 2015-10-15 DIAGNOSIS — R52 Pain, unspecified: Secondary | ICD-10-CM

## 2015-10-22 ENCOUNTER — Ambulatory Visit (HOSPITAL_COMMUNITY)
Admission: RE | Admit: 2015-10-22 | Discharge: 2015-10-22 | Disposition: A | Discharge status: Home / Self Care | Payer: Medicare Other | Source: Ambulatory Visit | Attending: Internal Medicine | Admitting: Internal Medicine

## 2015-10-22 DIAGNOSIS — R52 Pain, unspecified: Secondary | ICD-10-CM | POA: Insufficient documentation

## 2015-10-22 NOTE — Progress Notes (Signed)
*  Preliminary Results* Right lower extremity venous duplex completed. Right lower extremity is negative for deep vein thrombosis. There is no evidence of right Baker's cyst.  10/22/2015 10:55 AM  Maudry Mayhew, RVT, RDCS, RDMS

## 2016-02-11 ENCOUNTER — Other Ambulatory Visit: Payer: Self-pay

## 2016-02-11 DIAGNOSIS — Z1231 Encounter for screening mammogram for malignant neoplasm of breast: Secondary | ICD-10-CM

## 2016-02-23 ENCOUNTER — Ambulatory Visit
Admission: RE | Admit: 2016-02-23 | Discharge: 2016-02-23 | Disposition: A | Discharge status: Home / Self Care | Payer: Medicare Other | Source: Ambulatory Visit

## 2016-02-23 DIAGNOSIS — Z1231 Encounter for screening mammogram for malignant neoplasm of breast: Secondary | ICD-10-CM

## 2016-06-30 ENCOUNTER — Other Ambulatory Visit: Payer: Self-pay | Admitting: Internal Medicine

## 2016-06-30 DIAGNOSIS — Z87891 Personal history of nicotine dependence: Secondary | ICD-10-CM

## 2016-07-22 DIAGNOSIS — Z86718 Personal history of other venous thrombosis and embolism: Secondary | ICD-10-CM | POA: Insufficient documentation

## 2016-07-22 DIAGNOSIS — I824Z1 Acute embolism and thrombosis of unspecified deep veins of right distal lower extremity: Secondary | ICD-10-CM

## 2016-07-22 HISTORY — DX: Acute embolism and thrombosis of unspecified deep veins of right distal lower extremity: I82.4Z1

## 2016-07-29 ENCOUNTER — Ambulatory Visit
Admission: RE | Admit: 2016-07-29 | Discharge: 2016-07-29 | Disposition: A | Discharge status: Home / Self Care | Payer: Medicare Other | Source: Ambulatory Visit | Attending: Internal Medicine | Admitting: Internal Medicine

## 2016-07-29 DIAGNOSIS — Z87891 Personal history of nicotine dependence: Secondary | ICD-10-CM

## 2017-01-30 ENCOUNTER — Encounter (HOSPITAL_COMMUNITY): Payer: Self-pay

## 2017-01-30 DIAGNOSIS — Z87891 Personal history of nicotine dependence: Secondary | ICD-10-CM | POA: Diagnosis not present

## 2017-01-30 DIAGNOSIS — Z79899 Other long term (current) drug therapy: Secondary | ICD-10-CM | POA: Insufficient documentation

## 2017-01-30 DIAGNOSIS — R079 Chest pain, unspecified: Secondary | ICD-10-CM | POA: Insufficient documentation

## 2017-01-30 LAB — CBC
HCT: 40.6 % (ref 36.0–46.0)
Hemoglobin: 13.4 g/dL (ref 12.0–15.0)
MCH: 31.2 pg (ref 26.0–34.0)
MCHC: 33 g/dL (ref 30.0–36.0)
MCV: 94.6 fL (ref 78.0–100.0)
PLATELETS: 188 10*3/uL (ref 150–400)
RBC: 4.29 MIL/uL (ref 3.87–5.11)
RDW: 13.5 % (ref 11.5–15.5)
WBC: 6.7 10*3/uL (ref 4.0–10.5)

## 2017-01-30 NOTE — ED Triage Notes (Addendum)
Pt endorses sharp central chest pain with radiation to the back between the shoulder blades while sitting down watching tv 30 minutes pta with dizziness. Denies shob, n/v. Pt still has pain between the shoulder blades. Pt took 3 aspirin at home. Pt took an 11 hour car ride home yesterday and has hx of 3 blood clots in her legs.

## 2017-01-31 ENCOUNTER — Emergency Department (HOSPITAL_COMMUNITY): Payer: Medicare Other

## 2017-01-31 ENCOUNTER — Emergency Department (HOSPITAL_COMMUNITY)
Admission: EM | Admit: 2017-01-31 | Discharge: 2017-01-31 | Disposition: A | Discharge status: Home / Self Care | Payer: Medicare Other | Attending: Emergency Medicine | Admitting: Emergency Medicine

## 2017-01-31 DIAGNOSIS — R079 Chest pain, unspecified: Secondary | ICD-10-CM

## 2017-01-31 LAB — TROPONIN I: Troponin I: <0.03 ng/mL (ref <0.03)

## 2017-01-31 LAB — BASIC METABOLIC PANEL
ANION GAP: 9 (ref 5–15)
BUN: 14 mg/dL (ref 6–20)
CO2: 28 mmol/L (ref 22–32)
Calcium: 9.2 mg/dL (ref 8.9–10.3)
Chloride: 99 mmol/L — ABNORMAL LOW (ref 101–111)
Creatinine, Ser: 0.7 mg/dL (ref 0.44–1.00)
GFR calc Af Amer: >60 mL/min (ref >60)
GFR calc non Af Amer: >60 mL/min (ref >60)
Glucose, Bld: 96 mg/dL (ref 65–99)
Potassium: 4.1 mmol/L (ref 3.5–5.1)
Sodium: 136 mmol/L (ref 135–145)

## 2017-01-31 LAB — D-DIMER, QUANTITATIVE: D-Dimer, Quant: <0.27 ug/mL-FEU (ref 0.00–0.50)

## 2017-01-31 NOTE — ED Provider Notes (Signed)
[place] DEPT Provider Note   CSN: [id number] Arrival date & time: 01/30/17  2333  By signing my name below, I, Higinio Plan, attest that this documentation has been prepared under the direction and in the presence of Orpah Greek, MD . Electronically Signed: Higinio Plan, Scribe. 01/31/2017. 12:38 AM.  History   Chief Complaint Chief Complaint  Patient presents with  . Chest Pain   The history is provided by the patient. No language interpreter was used.   HPI Comments: Candace Frey is a 66 y.o. female with PMHx of thyroid disease, who presents to the Emergency Department complaining of gradually improving, "sharp," intermittent, central chest pain radiating into her upper back between her shoulder blades that began ~30 minutes PTA. Pt reports her pain began while she was watching television this evening and lasted for ~15 minutes before spontaneously resolving. She notes she is still experiencing upper back pain in the ED that is not exacerbated with certain positions or twisting motions. She states she has taken 3 Aspirin PTA with mild relief of her pain. Pt reports hx of a right knee arthroplasty on 01/29/15 in which she developed three blood clots in her bilateral legs shortly after this procedure and was not placed on any anticoagulant medication. She notes she recently took an eleven hour car ride and arrived home yesterday. She states hx of estrogen replacement use but reports she is no longer taking this medication. Pt denies any shortness of breath, nausea, vomiting, and leg swelling.    Past Medical History:  Diagnosis Date  . Complication of anesthesia    slow to wake up   . Shortness of breath dyspnea    with exertion was a smoker for many year per patient   . Thyroid disease    Patient Active Problem List   Diagnosis Date Noted  . Acute medial meniscal tear 01/29/2015   Past Surgical History:  Procedure Laterality Date  . KNEE ARTHROSCOPY Right 01/29/2015     Procedure: RIGHT KNEE ARTHROSCOPY WITH DEBRIDEMENT  ;  Surgeon: Gaynelle Arabian, MD;  Location: [facility] ORS;  Service: Orthopedics;  Laterality: Right;  . THYROIDECTOMY, PARTIAL  1998   OB History    No data available     Home Medications    Prior to Admission medications   Medication Sig Start Date End Date Taking? Authorizing Provider  Calcium Carbonate-Vitamin D (CALCIUM + D PO) Take 1 tablet by mouth daily.    Yes Historical Provider, MD  levothyroxine (SYNTHROID, LEVOTHROID) 100 MCG tablet Take 100 mcg by mouth daily before breakfast.   Yes Historical Provider, MD  Multiple Vitamins-Minerals (PRESERVISION AREDS 2 PO) Take 1 tablet by mouth 2 (two) times daily.   Yes Historical Provider, MD  HYDROcodone-acetaminophen (NORCO) 5-325 MG per tablet Take 1-2 tablets by mouth every 4 (four) hours as needed for moderate pain. Patient not taking: Reported on 01/31/2017 01/29/15   Gaynelle Arabian, MD  methocarbamol (ROBAXIN) 500 MG tablet Take 1 tablet (500 mg total) by mouth 4 (four) times daily. As needed for muscle spasm Patient not taking: Reported on 01/31/2017 01/29/15   Gaynelle Arabian, MD    Family History Family History  Problem Relation Age of Onset  . Colon cancer Neg Hx     Social History Social History  Substance Use Topics  . Smoking status: Former Smoker    Quit date: 11/22/2010  . Smokeless tobacco: Never Used  . Alcohol use 4.2 oz/week    7 Glasses of wine per  week     Comment: 2 glasses of wine per night   Allergies   Patient has no known allergies.  Review of Systems Review of Systems  Cardiovascular: Positive for chest pain. Negative for leg swelling.  Gastrointestinal: Negative for nausea and vomiting.  Musculoskeletal: Positive for back pain.  All other systems reviewed and are negative.  Physical Exam Updated Vital Signs BP 146/77 (BP Location: Right Arm)   Pulse 66   Temp 97.8 F (36.6 C) (Oral)   Resp 20   Ht 5\' 10"  (1.778 m)   Wt 177 lb (80.3 kg)   SpO2  100%   BMI 25.40 kg/m   Physical Exam  Constitutional: She is oriented to person, place, and time. She appears well-developed and well-nourished. No distress.  HENT:  Head: Normocephalic and atraumatic.  Right Ear: Hearing normal.  Left Ear: Hearing normal.  Nose: Nose normal.  Mouth/Throat: Oropharynx is clear and moist and mucous membranes are normal.  Eyes: Conjunctivae and EOM are normal. Pupils are equal, round, and reactive to light.  Neck: Normal range of motion. Neck supple.  Cardiovascular: Regular rhythm, S1 normal and S2 normal.  Exam reveals no gallop and no friction rub.   No murmur heard. Pulmonary/Chest: Effort normal and breath sounds normal. No respiratory distress. She exhibits no tenderness.  Abdominal: Soft. Normal appearance and bowel sounds are normal. There is no hepatosplenomegaly. There is no tenderness. There is no rebound, no guarding, no tenderness at McBurney's point and negative Murphy's sign. No hernia.  Musculoskeletal: Normal range of motion.  Neurological: She is alert and oriented to person, place, and time. She has normal strength. No cranial nerve deficit or sensory deficit. Coordination normal. GCS eye subscore is 4. GCS verbal subscore is 5. GCS motor subscore is 6.  Skin: Skin is warm, dry and intact. No rash noted. No cyanosis.  Psychiatric: She has a normal mood and affect. Her speech is normal and behavior is normal. Thought content normal.  Nursing note and vitals reviewed.  ED Treatments / Results  DIAGNOSTIC STUDIES:  Oxygen Saturation is 100% on RA, normal by my interpretation.    COORDINATION OF CARE:  12:24 AM Discussed treatment plan with pt at bedside and pt agreed to plan.  Labs (all labs ordered are listed, but only abnormal results are displayed) Labs Reviewed  BASIC METABOLIC PANEL - Abnormal; Notable for the following:       Result Value   Chloride 99 (*)    All other components within normal limits  CBC  TROPONIN I   D-DIMER, QUANTITATIVE (NOT AT [facility])    EKG  EKG Interpretation  Date/Time:  Sunday January 30 2017 23:40:43 EDT Ventricular Rate:  66 PR Interval:  132 QRS Duration: 92 QT Interval:  406 QTC Calculation: 425 R Axis:   86 Text Interpretation:  Normal sinus rhythm Normal ECG Confirmed by Betsey Holiday  MD, Meily Glowacki [phone]) on 01/31/2017 12:32:46 AM       Radiology Dg Chest 2 View  Result Date: 01/31/2017 CLINICAL DATA:  66 year old female with central substernal chest pain. EXAM: CHEST  2 VIEW COMPARISON:  Chest CT dated 07/29/2016 FINDINGS: There is mild emphysematous changes of the lungs with increased AP diameter. No focal consolidation, pleural effusion, or pneumothorax. The cardiac silhouette is within normal limits. No acute osseous pathology. IMPRESSION: No active cardiopulmonary disease. Electronically Signed   By: Anner Crete M.D.   On: 01/31/2017 01:06    Procedures Procedures (including critical care time)  Medications  Ordered in ED Medications - No data to display  Initial Impression / Assessment and Plan / ED Course  I have reviewed the triage vital signs and the nursing notes.  Pertinent labs & imaging results that were available during my care of the patient were reviewed by me and considered in my medical decision making (see chart for details).     Patient presents to the emergency department for evaluation of discomfort in her back and chest. Patient had pain between her shoulder blades that then became anterior chest discomfort. Both symptoms have completely resolved by the time she was seen in the ER. Patient was concerned, however, because she does have a history of blood clots. Patient had a DVT in her right leg after knee surgery. She has not currently on any anticoagulation. She did recently just had a long car ride. She does not, however, have any unilateral leg swelling or calf tenderness. Vital signs are essentially normal. No tachycardia. No pulmonary  complaints such as shortness of breath. She did not have any chest pain. Her lab work is entirely normal. This included EKG, troponin. Additionally she has a negative d-dimer which is felt to be adequate to rule out PE in this patient. Patient was reassured, and follow-up with her primary doctor.  HEART Score for Major Cardiac Events from [email or web address]  on 01/31/2017 ** All calculations should be rechecked by clinician prior to use **  RESULT SUMMARY: 3 points Low Score (0-3 points)  Risk of MACE of 0.9-1.7%.   INPUTS: History -> 0 = Slightly suspicious EKG -> 0 = Normal Age -> 2 = =63 Risk factors -> 1 = 1-2 risk factors Initial troponin -> 0 = =normal limit   I personally performed the services described in this documentation, which was scribed in my presence. The recorded information has been reviewed and is accurate.   Final Clinical Impressions(s) / ED Diagnoses   Final diagnoses:  Chest pain, unspecified type    New Prescriptions New Prescriptions   No medications on file     Orpah Greek, MD 01/31/17 [phone]

## 2017-01-31 NOTE — ED Notes (Signed)
ED Provider at bedside. 

## 2017-01-31 NOTE — ED Notes (Signed)
Patient transported to X-ray 

## 2017-02-04 ENCOUNTER — Other Ambulatory Visit: Payer: Self-pay | Admitting: Obstetrics and Gynecology

## 2017-02-04 DIAGNOSIS — Z1231 Encounter for screening mammogram for malignant neoplasm of breast: Secondary | ICD-10-CM

## 2017-02-24 ENCOUNTER — Ambulatory Visit: Payer: Medicare Other

## 2017-02-28 ENCOUNTER — Other Ambulatory Visit: Payer: Self-pay | Admitting: Internal Medicine

## 2017-02-28 DIAGNOSIS — R0602 Shortness of breath: Secondary | ICD-10-CM

## 2017-03-03 ENCOUNTER — Ambulatory Visit
Admission: RE | Admit: 2017-03-03 | Discharge: 2017-03-03 | Disposition: A | Discharge status: Home / Self Care | Payer: Medicare Other | Source: Ambulatory Visit | Attending: Internal Medicine | Admitting: Internal Medicine

## 2017-03-03 DIAGNOSIS — R0602 Shortness of breath: Secondary | ICD-10-CM

## 2017-03-14 ENCOUNTER — Ambulatory Visit
Admission: RE | Admit: 2017-03-14 | Discharge: 2017-03-14 | Disposition: A | Discharge status: Home / Self Care | Payer: Medicare Other | Source: Ambulatory Visit | Attending: Obstetrics and Gynecology | Admitting: Obstetrics and Gynecology

## 2017-03-14 DIAGNOSIS — Z1231 Encounter for screening mammogram for malignant neoplasm of breast: Secondary | ICD-10-CM

## 2017-05-31 ENCOUNTER — Institutional Professional Consult (permissible substitution): Payer: Medicare Other | Admitting: Internal Medicine

## 2017-07-05 ENCOUNTER — Encounter: Payer: Self-pay | Admitting: Internal Medicine

## 2017-07-05 ENCOUNTER — Ambulatory Visit (INDEPENDENT_AMBULATORY_CARE_PROVIDER_SITE_OTHER): Payer: Medicare Other | Admitting: Internal Medicine

## 2017-07-05 VITALS — BP 126/72 | HR 92 | Ht 69.0 in | Wt 166.8 lb

## 2017-07-05 DIAGNOSIS — R918 Other nonspecific abnormal finding of lung field: Secondary | ICD-10-CM

## 2017-07-05 DIAGNOSIS — J449 Chronic obstructive pulmonary disease, unspecified: Secondary | ICD-10-CM

## 2017-07-05 HISTORY — DX: Other nonspecific abnormal finding of lung field: R91.8

## 2017-07-05 LAB — PULMONARY FUNCTION TEST
FEF 25-75 PRE: 1.28 L/s
FEF 25-75 Post: 1.48 L/sec
FEF2575-%Change-Post: 15 %
FEF2575-%PRED-POST: 64 %
FEF2575-%Pred-Pre: 55 %
FEV1-%CHANGE-POST: 3 %
FEV1-%PRED-POST: 85 %
FEV1-%PRED-PRE: 82 %
FEV1-POST: 2.41 L
FEV1-PRE: 2.33 L
FEV1FVC-%Change-Post: 0 %
FEV1FVC-%Pred-Pre: 89 %
FEV6-%CHANGE-POST: 2 %
FEV6-%PRED-POST: 97 %
FEV6-%Pred-Pre: 95 %
FEV6-POST: 3.47 L
FEV6-PRE: 3.39 L
FEV6FVC-%CHANGE-POST: -1 %
FEV6FVC-%PRED-POST: 101 %
FEV6FVC-%PRED-PRE: 103 %
FVC-%CHANGE-POST: 4 %
FVC-%PRED-POST: 96 %
FVC-%Pred-Pre: 92 %
FVC-Post: 3.56 L
FVC-Pre: 3.42 L
POST FEV1/FVC RATIO: 68 %
POST FEV6/FVC RATIO: 98 %
PRE FEV6/FVC RATIO: 99 %
Pre FEV1/FVC ratio: 68 %

## 2017-07-05 NOTE — Patient Instructions (Signed)
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

## 2017-07-05 NOTE — Assessment & Plan Note (Signed)
CT 03/03/17  Right lower lobe nodularity described on the prior exam is less apparent today, favored to represent an area of scarring. other tiny nodules described on the prior are not readily apparent. No enlarging or dominant nodule identified > rec continue low dose annual  screening until 2023 per PCP (pt has fm hx lung ca)   CT results reviewed with pt > > really only option for now is follow the Fleischner society guidelines as rec by radiology along with the approved screening for low dose CT  Discussed in detail all the  indications, usual  risks and alternatives  relative to the benefits with patient who agrees to proceed with conservative f/u as outlined  Per PCP  - f/u here prn

## 2017-07-05 NOTE — Progress Notes (Signed)
Subjective:    Patient ID: Candace Frey, female    DOB: 12-30-1950   MRN: [id number]  HPI  35 yowf quit smoking 2008 with tendency all her life to  nasal congestion and HA  otc's ok worse fall > spring worse while living  in [place] and  [place] to  [place] since 2014 to help raise grandchildren and here noted onset doe x one flight up steps  despite losing wt x 2016 so referred to pulmonary clinic 07/05/2017 by Dr  Helane Rima.   07/05/2017 1st [facility] office visit/ Melvyn Novas   Doe is proportionate to activity  Never at rest or sleeping / never tried inhalers  Nasal symptoms starting up now typical of fall but this doesn't affect breathing at all   No obvious   day to day or daytime variabilty or assoc chronic cough or cp or chest tightness, subjective wheeze overt sinus or hb symptoms. No unusual exp hx or h/o childhood pna/ asthma or knowledge of premature birth.  Sleeping ok without nocturnal  or early am exacerbation  of respiratory  c/o's or need for noct saba. Also denies any obvious fluctuation of symptoms with weather or environmental changes or other aggravating or alleviating factors except as outlined above   Current Medications, Allergies, Complete Past Medical History, Past Surgical History, Family History, and Social History were reviewed in [employer] record.              Review of Systems  Constitutional: Negative for fever and unexpected weight change.  HENT: Negative for congestion, dental problem, ear pain, nosebleeds, postnasal drip, rhinorrhea, sinus pressure, sneezing, sore throat and trouble swallowing.   Eyes: Negative for redness and itching.  Respiratory: Positive for shortness of breath. Negative for cough, chest tightness and wheezing.   Cardiovascular: Negative for palpitations and leg swelling.  Gastrointestinal: Negative for nausea and vomiting.  Genitourinary: Negative for dysuria.  Musculoskeletal: Negative for joint  swelling.  Skin: Negative for rash.  Neurological: Negative for headaches.  Hematological: Does not bruise/bleed easily.  Psychiatric/Behavioral: Negative for dysphoric mood. The patient is not nervous/anxious.        Objective:   Physical Exam  Pleasant amb wf nad x for freq throat clearing   Wt Readings from Last 3 Encounters:  07/05/17 166 lb 12.8 oz (75.7 kg)  01/30/17 177 lb (80.3 kg)  01/29/15 171 lb (77.6 kg)    Vital signs reviewed - Note on arrival 02 sats  97% on RA      HEENT: nl dentition, turbinates bilaterally, and oropharynx. Nl external ear canals without cough reflex   NECK :  without JVD/Nodes/TM/ nl carotid upstrokes bilaterally   LUNGS: no acc muscle use,  Nl contour chest which is clear to A and P bilaterally without cough on insp or exp maneuvers   CV:  RRR  no s3 or murmur or increase in P2, and no edema   ABD:  soft and nontender with nl inspiratory excursion in the supine position. No bruits or organomegaly appreciated, bowel sounds nl  MS:  Nl gait/ ext warm without deformities, calf tenderness, cyanosis or clubbing No obvious joint restrictions   SKIN: warm and dry without lesions    NEURO:  alert, approp, nl sensorium with  no motor or cerebellar deficits apparent.      I personally reviewed images and agree with radiology impression as follows:   Chest CT  s contrast  03/03/17   1. Centrilobular and paraseptal  emphysema, without suspicious pulmonary nodule. 2.  Aortic atherosclerosis.      Assessment & Plan:

## 2017-07-05 NOTE — Progress Notes (Signed)
PFT done today. 

## 2017-07-05 NOTE — Assessment & Plan Note (Addendum)
Quit smoking 2008 - Spirometry 07/05/2017  FEV1 2.41 (85%)  Ratio 68 p 3% better with saba off all maint rx     - 07/05/2017  Walked RA x 3 laps @ 185 ft each stopped due to  End of study, fast pace, no sob or desat     I reviewed the Fletcher curve with the patient that basically indicates  if you quit smoking when your best day FEV1 is still well preserved (as is clearly  the case here)  it is highly unlikely you will progress to severe disease and informed the patient there was  no medication on the market that has proven to alter the curve/ its downward trajectory  or the likelihood of progression of their disease(unlike other chronic medical conditions such as atheroclerosis where we do think we can change the natural hx with risk reducing meds)    Therefore stopping smoking and maintaining abstinence is the most important aspect of care, not choice of inhalers or for that matter, doctors.    As I explained to this patient in detail:  although there may be very mild  copd present, it may not be clinically relevant:   it does not appear to be limiting activity tolerance any more than a set of worn tires limits someone from driving a car  around a parking lot.  A new set of Michelins might look good but would have no perceived impact on the performance of the car and would not be worth the cost.  That is to say:    I don't recommend aggressive pulmonary rx at this point unless limiting symptoms arise or acute exacerbations become as issue, neither of which is the case now.  I asked the patient to contact this office at any time in the future should either of these problems arise.     Total time devoted to counseling  > 50 % of initial 60 min office visit:  review case with pt/ discussion of options/alternatives/ personally creating written customized instructions  in presence of pt  then going over those specific  Instructions directly with the pt including how to use all of the meds but in particular  covering each new medication in detail and the difference between the maintenance= "automatic" meds and the prns using an action plan format for the latter (If this problem/symptom => do that organization reading Left to right).  Please see AVS from this visit for a full list of these instructions which I personally wrote for this pt and  are unique to this visit.

## 2017-09-08 IMAGING — CT CT CHEST W/O CM
3 of 4 series · 17 of 30 positions shown, 19 images · non-contrast
Comparison: Plain films 01/31/2017.  Screening CT of 07/29/2016.

CLINICAL DATA: Followup of lung nodule. Shortness of breath. No
history cancer.

EXAM:
CT CHEST WITHOUT CONTRAST
TECHNIQUE: Multidetector CT imaging of the chest was performed following the
standard protocol without IV contrast.

[Series 3: chest w/o · axial · non-contrast · 0.70mm/px · z∈[-275,-35]mm · 7 of 130 slices shown, 9 images]
[im 17/130  mediastinal]
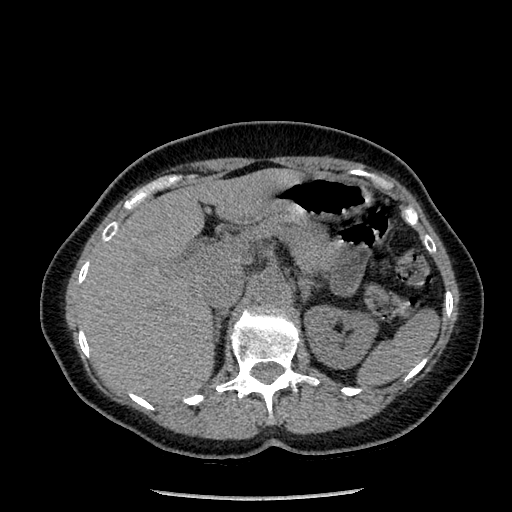
[im 17/130  lung]
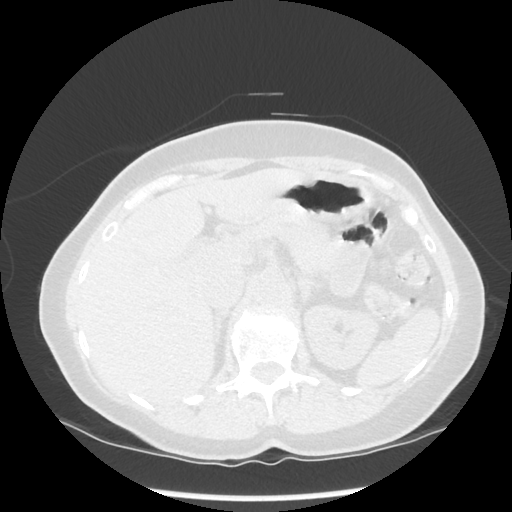
[im 33/130  lung]
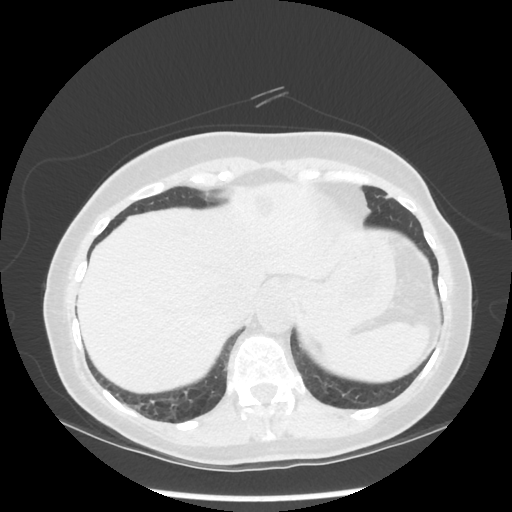
[im 49/130  lung]
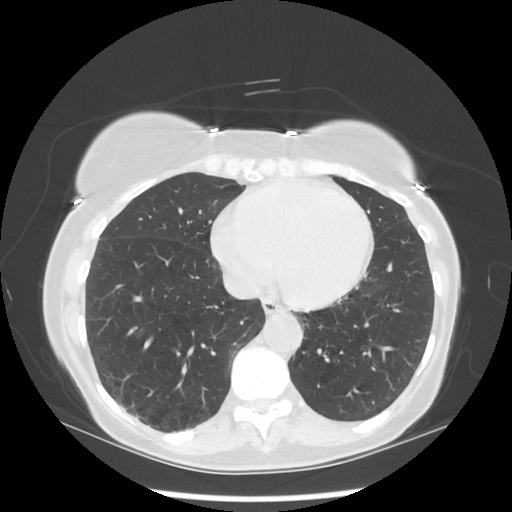
[im 65/130  lung]
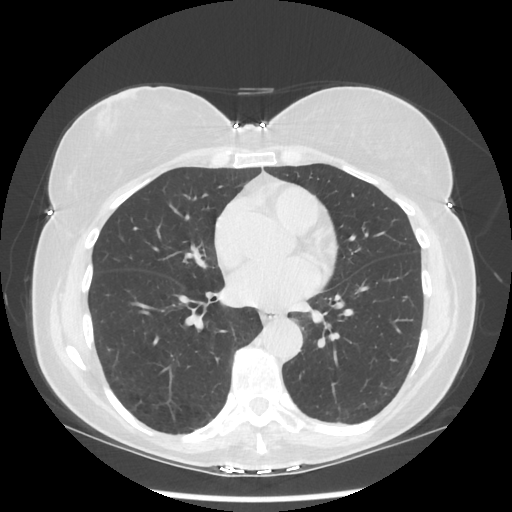
[im 81/130  mediastinal]
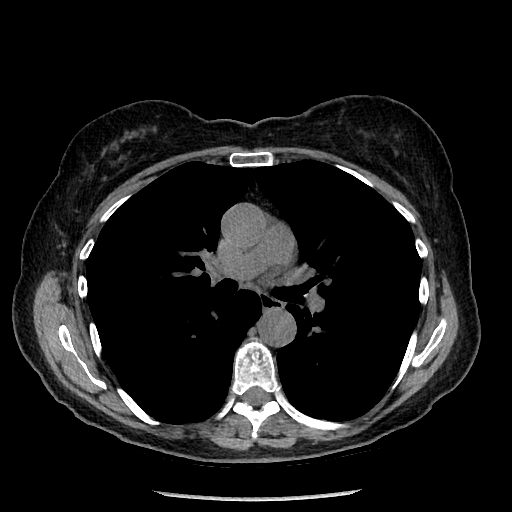
[im 81/130  lung]
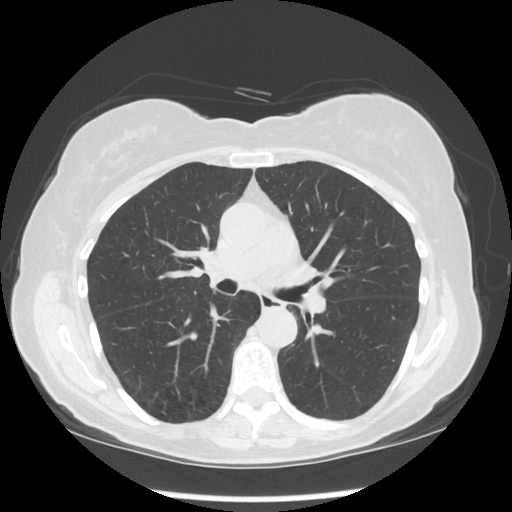
[im 97/130  lung]
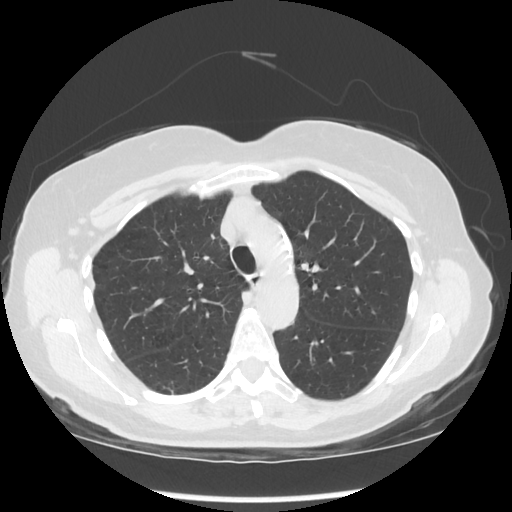
[im 113/130  lung]
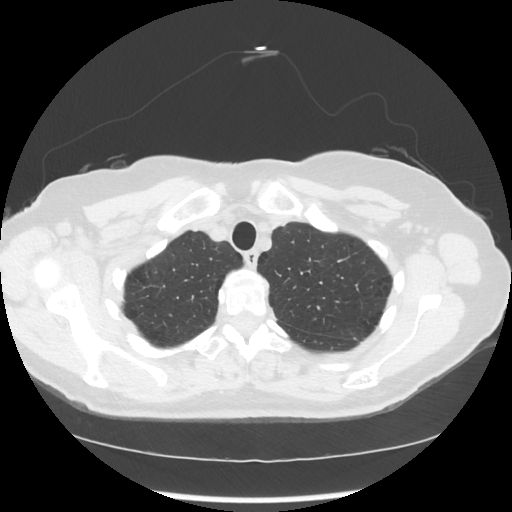

[Series 4: lung windows · axial · 0.70mm/px · z∈[-275,-35]mm · 7 of 130 slices shown]
[im 17/130  lung]
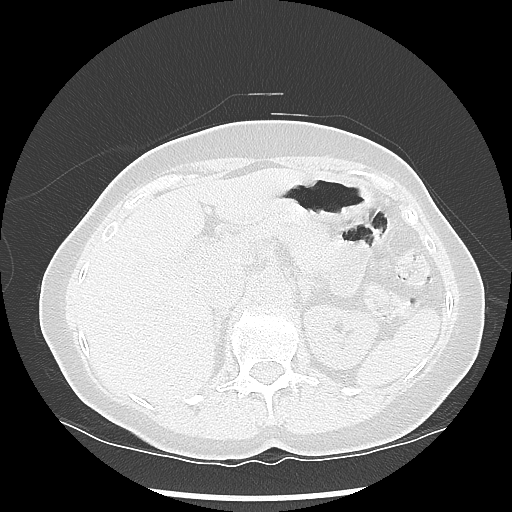
[im 33/130  lung]
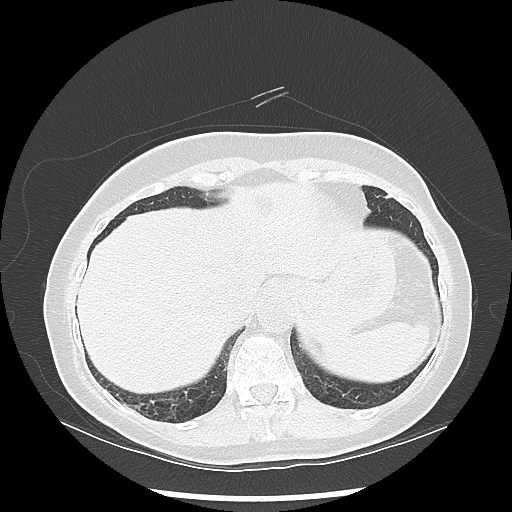
[im 49/130  lung]
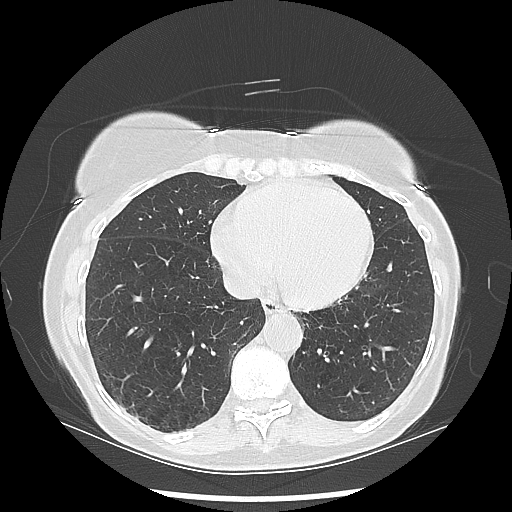
[im 65/130  lung]
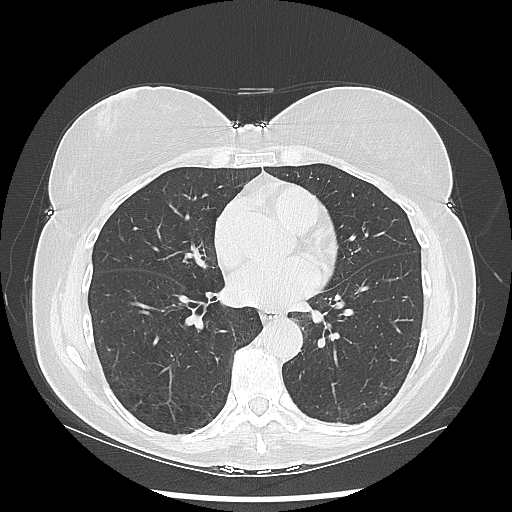
[im 81/130  lung]
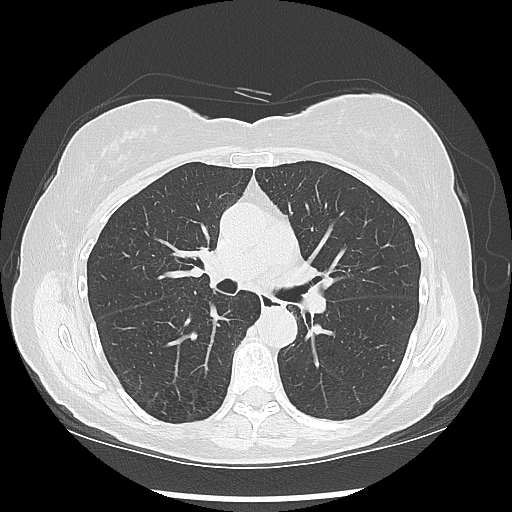
[im 97/130  lung]
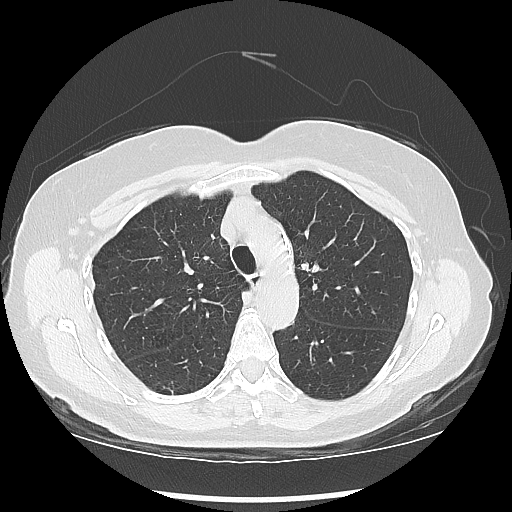
[im 113/130  lung]
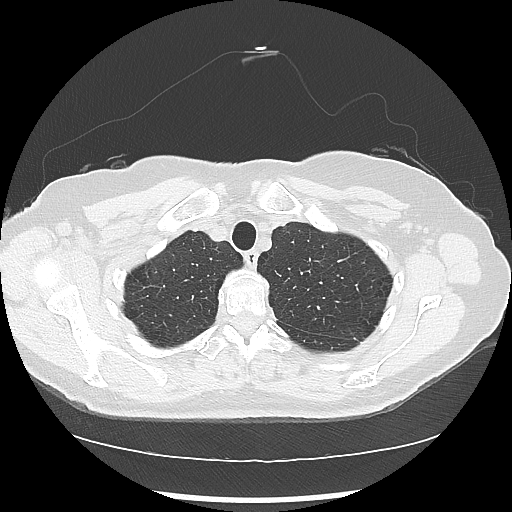

[Series 602: sagittal body · sagittal · 0.70mm/px · 3 of 145 slices shown]
[im 17/145  mediastinal]
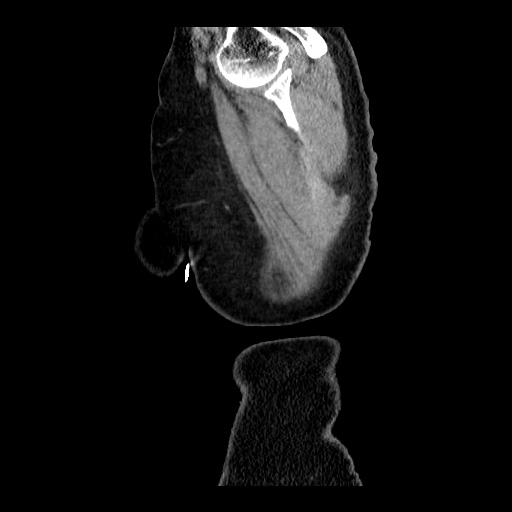
[im 33/145  mediastinal]
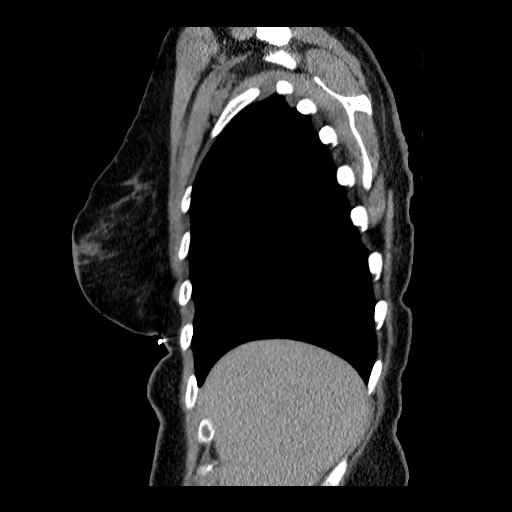
[im 49/145  mediastinal]
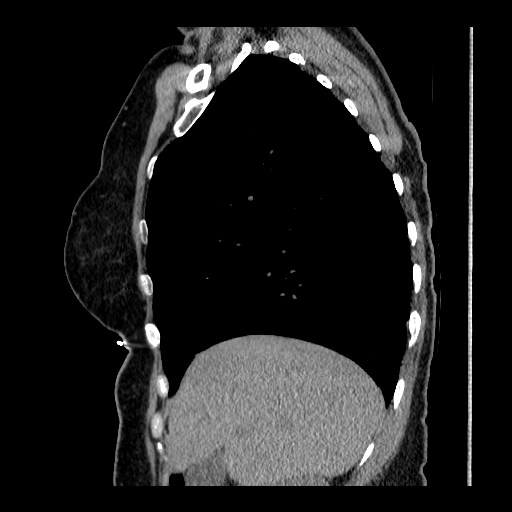

[17 of 30 positions shown; findings below may reference images not displayed]

FINDINGS: Cardiovascular: Normal heart size, without pericardial effusion.
Aortic atherosclerosis.

Mediastinum/Nodes: No mediastinal or definite hilar adenopathy,
given limitations of unenhanced CT.

Lungs/Pleura: No pleural fluid. Mild centrilobular and paraseptal
emphysema.

Right lower lobe nodularity described on the prior exam is less
apparent today, favored to represent an area of scarring. other tiny
nodules described on the prior are not readily apparent. No
enlarging or dominant nodule identified.

Upper Abdomen: Left hepatic lobe cyst. Normal imaged portions of the
spleen, stomach, pancreas, adrenal glands, right kidney. Interpolar
left renal low-density lesion is incompletely imaged but likely a
cyst at on the order of 12 mm.

Musculoskeletal: Moderate thoracic spondylosis.
IMPRESSION: 1. Centrilobular and paraseptal emphysema, without suspicious
pulmonary nodule.
2.  Aortic atherosclerosis.

## 2017-10-27 ENCOUNTER — Encounter: Payer: Self-pay | Admitting: Sports Medicine

## 2017-10-27 ENCOUNTER — Ambulatory Visit: Payer: Medicare Other | Admitting: Sports Medicine

## 2017-10-27 VITALS — BP 128/86 | Ht 70.0 in | Wt 175.0 lb

## 2017-10-27 DIAGNOSIS — M25512 Pain in left shoulder: Secondary | ICD-10-CM | POA: Diagnosis not present

## 2017-10-27 NOTE — Addendum Note (Signed)
Addended by: Jolinda Croak E on: 10/27/2017 03:31 PM   Modules accepted: Orders

## 2017-10-27 NOTE — Progress Notes (Signed)
   Subjective:    Patient ID: Candace Frey, female    DOB: 1950/12/22, 66 y.o.   MRN: [id number]  HPI Candace Frey is a 66 y.o. pleasant female with history of thyroid disease and right knee meniscal tear s/p surgery and subsequent right leg DVT who presents for the first time for evaluation of shoulder weakness and discomfort after falling on left shoulder 4 weeks ago.   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 Also describes hypermobility of her left shoulder from very young age. She has been working with [employer] in [place] for about 1 year to help with general body strength and posture.   Approximately 4 weeks ago, she was walking in a parking lot and tripped over a concrete block. She fell on the concrete and landed on her left side (left shoulder, arm, hip, lateral knee). She was initially bruised along her left side with edema in left antecubital fossa and pain with elbow flexion. These symptoms resolved but she developed persistent discomfort in her left shoulder. The pain started to improve after 2 weeks; however, 1 week ago it started to worsen. She had initially restricted use of left arm but in the last week has returned to normal use which she believes is the cause of increased pain now. Discomfort is both with rest and with activity, and is preventing her from sleeping well. She took ibuprofen last night which did improve pain and allow her to sleep more comfortably.    Review of Systems  as above    Objective:   Physical Exam   Well-developed, well-nourished. No acute distress. Awake alert and oriented 3. Vital signs reviewed  Left Shoulder: Inspection reveals no abnormalities, atrophy or asymmetry. Palpation is normal with no tenderness over AC joint or bicipital groove. No  signs of impingement. Normal scapular function observed. No obvious biceps deformity 4/5 external rotation (noted bilaterally), 5/5 internal rotation Weakness with left supraspinatus use Discomfort with internal rotation Normal external rotation Weakness with forward flexion Abduction ROM passively to 160 degrees and with help of other hand to 180 degrees     Assessment & Plan:  Candace Frey is a 66 y.o. F presenting for left shoulder discomfort and weakness following a fall on her left side 4 weeks ago.   Wall walks, pendulum L shoulder lateral and AP XR Return next week (Wednesday) for shoulder [place] Consider MRI pending [place] results  Patient seen and evaluated with the resident. I agree with the above plan of care. Patient presents today with left shoulder pain after a fall. Her exam shows weakness with isolated supraspinatus testing. This is concerning for a possible rotator cuff tear. We will get some x-rays today and I will schedule an ultrasound to be performed next week. Depending on those results we may need to consider an MRI. In the meantime, patient will start range of motion exercises. No strengthening. She can continue to take Motrin as needed for pain (this has been helpful especially at night).

## 2017-10-27 NOTE — Progress Notes (Deleted)
She fell about 4 weeks ago onto concrete (tripped over concrete block in parking lot), did not break her fall and landed on her whole left side of her body  2 weeks after fall was all bruised, no swelling except in antecubital fossa (was very painful to flex elbow but that resolved) L shoulder was hurting x2 weeks, then better, then 1 week ago started to worsen, maybe because using it more She was resting the arm more initially after injury but has returned to normal activity more recently She is not sleeping well because of it At rest has a lot of discomfort  Extending, especially with weight, is most painful Rotating internally and externally is not painful Hyperflexible  L shoulder has been a problem for the last few years, 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

## 2017-11-02 ENCOUNTER — Encounter: Payer: Self-pay | Admitting: Family Medicine

## 2017-11-02 ENCOUNTER — Ambulatory Visit (INDEPENDENT_AMBULATORY_CARE_PROVIDER_SITE_OTHER): Payer: Medicare Other | Admitting: Family Medicine

## 2017-11-02 DIAGNOSIS — M25512 Pain in left shoulder: Secondary | ICD-10-CM

## 2017-11-02 MED ORDER — NITROGLYCERIN 0.2 MG/HR TD PT24: MEDICATED_PATCH | TRANSDERMAL | 1 refills | Status: DC

## 2017-11-02 NOTE — Patient Instructions (Addendum)
Nitroglycerin Protocol   Apply 1/4 nitroglycerin patch to affected area daily.  Change position of patch within the affected area every 24 hours.  You may experience a headache during the first 1-2 weeks of using the patch, these should subside.  If you experience headaches after beginning nitroglycerin patch treatment, you may take your preferred over the counter pain reliever.  Another side effect of the nitroglycerin patch is skin irritation or rash related to patch adhesive.  Please notify our office if you develop more severe headaches or rash, and stop the patch.  Tendon healing with nitroglycerin patch may require 12 to 24 weeks depending on the extent of injury.  Men should not use if taking Viagra, Cialis, or Levitra.   Do not use if you have migraines or rosacea.   MRI of the Shoulder is scheduled for Jan. 3, 2019 Thurs at [facility] time is [facility]  [place] [address] [phone]

## 2017-11-02 NOTE — Progress Notes (Signed)
Chief complaint: Follow up of left shoulder pain 5 weeks  History of present illness: Tuwanna is a 66 year old right hand dominant female who presents to the sports medicine office today for follow-up of left shoulder pain. She initially presented here in the office 6 days ago back on 10/27/17. She reports that she did have a fall 4 weeks prior to that were she fell with her left arm outstretched. She reports feeling pain deep in her left shoulder. She reports aggravating events include any type of shoulder abduction and external rotation. She reports specific events including trying to raise her left arm overhead as well as reaching behind to grab the seatbelt in her car. She reports that she has been trying to do some gentle Pilates but she is trying to be cognizant of not trying to cause any further damage. She does not report of any neck pain or posterior shoulder pain. She is not report of any numbness, tingling, or burning paresthesias. She reports initially that she did have a lot of bruising in her left shoulder but this has resolved. She does not report of any previous left shoulder injury. Today, she describes the pain as a throbbing and nagging pain. She rates the pain as a 1/10. She has not used any medications otherwise for relief of symptoms. She has been doing the home exercise program daily at home.  Review of systems:  As stated above  Interval past medical history, surgical history, family history, and social history obtained and unchanged.  Physical exam: Vital signs are reviewed and are documented in the chart Gen.: Alert, oriented, appears stated age, in no apparent distress HEENT: Moist oral mucosa Respiratory: Normal respirations, able to speak in full sentences Cardiac: Regular rate, distal pulses 2+ Integumentary: No rashes on visible skin:  Neurologic: He does have some slight weakness with rotator cuff strength testing on the left side, specifically with infraspinatus and  supraspinatus testing, would categorize this is 4+/5, otherwise strength 5/5, sensation 2+ in bilateral upper extremities Psych: Normal affect, mood is described as good Musculoskeletal: Inspection of left shoulder reveals no obvious deformity or muscle atrophy, no warmth, erythema, ecchymosis, or effusion, she is slightly tender to deep palpation at proximal head of biceps, no tenderness over distal clavicle, AC joint, acromion, cervical spine, paracervical spinal processes, trapezius, scapular spine, or deltoid, she does have full range of motion of her shoulder today, but do notice pain when she gets to 80 of forward flexion as well as 45 of abduction on the left side, she starts to have pain with 60 of external range of motion, no pain elicited with internal range of motion, rotator cuff impingement testing is positive with empty can, Hawkins, Neer, and cross arm, speed, Yergason negative, O'Brien is positive, Spurling negative  Limited musculoskeletal ultrasound was performed in the office today of her left shoulder. He findings of the ultrasound revealed that she does have evidence of partial degenerative for her to do cuff tear of the supraspinatus and infraspinatus, as I also do see some cortical irregularity and bony spurring on the articular side of the supraspinatus, she does have minimal effusion with a slight mushroom sign on AC joint view. Biceps appears to be intact, subscapularis appears to be intact, teres minor appears to be intact  Assessment and plan: 1. Left shoulder pain, does post fall on outstretched left arm 5 weeks ago, and ultrasound evidence of partial rotator cuff tear involving the supraspinatus and infraspinatus tendons  Plan: Discussed ultrasound findings  with Tijah today and suspicion of partial rotator cuff tear the supraspinatus and infraspinatus tendons. This would explain the pain that she is having with abduction and external range of motion. Discussed plan of  getting MRI for further diagnostic evaluation. She will get this done in the first week of January. In the meantime, will have her started on nitroglycerin protocol. Discussed to cut patch into quarter size pieces and apply on area of maximal tenderness on daily basis, switching location of patch on daily basis to minimize risk of rash. She does not have any contraindications to include migraines and rosacea. Discussed headache as main side effect. She will continue to do Pilates and home exercise program at home. She will reach out to her insurance company to see if physical therapy done at Pilates studio will be covered. If she needs [place] to have order sent over, we will be glad to do so. We'll call her after MRI, with plan of care and further follow-up to be determined after MRI. Otherwise, she will follow-up sooner as needed.   Mort Sawyers, M.D. [place]

## 2017-11-24 ENCOUNTER — Other Ambulatory Visit: Payer: Medicare Other

## 2018-05-22 ENCOUNTER — Other Ambulatory Visit: Payer: Self-pay | Admitting: Internal Medicine

## 2018-05-22 DIAGNOSIS — R609 Edema, unspecified: Secondary | ICD-10-CM

## 2018-05-23 ENCOUNTER — Other Ambulatory Visit: Payer: Medicare Other

## 2018-05-24 ENCOUNTER — Other Ambulatory Visit: Payer: Medicare Other

## 2018-09-26 ENCOUNTER — Ambulatory Visit: Payer: Medicare Other | Admitting: Psychology

## 2018-09-26 DIAGNOSIS — F411 Generalized anxiety disorder: Secondary | ICD-10-CM

## 2018-10-10 ENCOUNTER — Ambulatory Visit (INDEPENDENT_AMBULATORY_CARE_PROVIDER_SITE_OTHER): Payer: Medicare Other | Admitting: Psychology

## 2018-10-10 DIAGNOSIS — F411 Generalized anxiety disorder: Secondary | ICD-10-CM

## 2018-10-24 ENCOUNTER — Ambulatory Visit (INDEPENDENT_AMBULATORY_CARE_PROVIDER_SITE_OTHER): Payer: Medicare Other | Admitting: Psychology

## 2018-10-24 DIAGNOSIS — F411 Generalized anxiety disorder: Secondary | ICD-10-CM

## 2018-10-30 ENCOUNTER — Other Ambulatory Visit: Payer: Self-pay | Admitting: Internal Medicine

## 2018-10-30 DIAGNOSIS — Z0000 Encounter for general adult medical examination without abnormal findings: Secondary | ICD-10-CM

## 2018-10-30 DIAGNOSIS — Z87891 Personal history of nicotine dependence: Secondary | ICD-10-CM

## 2018-10-30 DIAGNOSIS — E039 Hypothyroidism, unspecified: Secondary | ICD-10-CM

## 2018-10-30 DIAGNOSIS — E041 Nontoxic single thyroid nodule: Secondary | ICD-10-CM

## 2018-12-07 ENCOUNTER — Ambulatory Visit: Payer: Medicare Other | Admitting: Psychology

## 2018-12-07 DIAGNOSIS — F411 Generalized anxiety disorder: Secondary | ICD-10-CM | POA: Diagnosis not present

## 2018-12-18 ENCOUNTER — Ambulatory Visit: Payer: Medicare Other | Admitting: Psychology

## 2019-01-05 ENCOUNTER — Telehealth: Payer: Self-pay | Admitting: Internal Medicine

## 2019-01-05 DIAGNOSIS — Z7689 Persons encountering health services in other specified circumstances: Secondary | ICD-10-CM

## 2019-01-05 NOTE — Telephone Encounter (Signed)
Pyrtle, Candace Lines, MD  Larina Bras, CMA        Please place PCP referral to Dr. Sharlet Salina for this patient and his wife Candace Frey). Both are my patients.  Non-urgent but they are requesting a change from their current primary care  Thanks  JMP

## 2019-01-17 ENCOUNTER — Ambulatory Visit (INDEPENDENT_AMBULATORY_CARE_PROVIDER_SITE_OTHER): Payer: Medicare Other | Admitting: Psychology

## 2019-01-17 DIAGNOSIS — F411 Generalized anxiety disorder: Secondary | ICD-10-CM | POA: Diagnosis not present

## 2019-03-09 ENCOUNTER — Ambulatory Visit: Payer: Medicare Other | Admitting: Psychology

## 2019-04-04 ENCOUNTER — Ambulatory Visit: Payer: Self-pay | Admitting: Family

## 2019-04-06 ENCOUNTER — Ambulatory Visit (INDEPENDENT_AMBULATORY_CARE_PROVIDER_SITE_OTHER): Payer: Medicare Other | Admitting: Psychology

## 2019-04-06 ENCOUNTER — Other Ambulatory Visit: Payer: Self-pay

## 2019-04-06 ENCOUNTER — Encounter (HOSPITAL_COMMUNITY): Payer: Self-pay

## 2019-04-06 ENCOUNTER — Ambulatory Visit (HOSPITAL_COMMUNITY)
Admission: EM | Admit: 2019-04-06 | Discharge: 2019-04-06 | Disposition: A | Discharge status: Home / Self Care | Payer: Medicare Other | Attending: Family Medicine | Admitting: Family Medicine

## 2019-04-06 DIAGNOSIS — N898 Other specified noninflammatory disorders of vagina: Secondary | ICD-10-CM | POA: Insufficient documentation

## 2019-04-06 DIAGNOSIS — F411 Generalized anxiety disorder: Secondary | ICD-10-CM

## 2019-04-06 DIAGNOSIS — R509 Fever, unspecified: Secondary | ICD-10-CM

## 2019-04-06 DIAGNOSIS — R300 Dysuria: Secondary | ICD-10-CM

## 2019-04-06 DIAGNOSIS — N760 Acute vaginitis: Secondary | ICD-10-CM | POA: Diagnosis present

## 2019-04-06 HISTORY — DX: Chronic obstructive pulmonary disease, unspecified: J44.9

## 2019-04-06 LAB — POCT URINALYSIS DIP (DEVICE)
Bilirubin Urine: NEGATIVE
Glucose, UA: NEGATIVE mg/dL
Ketones, ur: NEGATIVE mg/dL
Nitrite: NEGATIVE
Protein, ur: NEGATIVE mg/dL
Specific Gravity, Urine: 1.02 (ref 1.005–1.030)
Urobilinogen, UA: 0.2 mg/dL (ref 0.0–1.0)
pH: 6 (ref 5.0–8.0)

## 2019-04-06 MED ORDER — FLUCONAZOLE 150 MG PO TABS: 150.0000 mg | ORAL_TABLET | Freq: Every day | ORAL | 0 refills | Status: DC

## 2019-04-06 MED ORDER — METRONIDAZOLE 500 MG PO TABS: 500.0000 mg | ORAL_TABLET | Freq: Two times a day (BID) | ORAL | 0 refills | Status: DC

## 2019-04-06 MED ORDER — CEFTRIAXONE SODIUM 250 MG IJ SOLR
250.0000 mg | Freq: Once | INTRAMUSCULAR | Status: AC
  Administered 2019-04-06: 250 mg via INTRAMUSCULAR

## 2019-04-06 MED ORDER — DOXYCYCLINE HYCLATE 100 MG PO CAPS: 100.0000 mg | ORAL_CAPSULE | Freq: Two times a day (BID) | ORAL | 0 refills | Status: DC

## 2019-04-06 MED ORDER — CEFTRIAXONE SODIUM 250 MG IJ SOLR
INTRAMUSCULAR | Status: AC
  Filled 2019-04-06: qty 250

## 2019-04-06 NOTE — ED Provider Notes (Signed)
[facility]    CSN: [id number] Arrival date & time: 04/06/19  1300     History   Chief Complaint Chief Complaint  Patient presents with  . Vaginal Discharge    HPI Candace Frey is a 68 y.o. female.   Patient is a 68 year old female who presents today with vaginal discharge, vaginal discomfort and pressure, low-grade fever and dysuria.  Symptoms have been waxing and waning for the past couple weeks.  She describes the discharge as clear in purulent.  Denies any significant fever.  Admits to using a vaginal lubricant with sexual intercourse. She started this about 6 months ago.  She is sexually active with her husband but not very frequently.  She has had some mild itching and skin color changes to the vaginal area .  She is postmenopausal and is not using any kind of hormones.  Denies any significant pelvic discomfort.  No urinary frequency or hematuria.  No flank pain or back pain. No concern for STDs.   ROS per HPI      Past Medical History:  Diagnosis Date  . Complication of anesthesia    slow to wake up   . COPD (chronic obstructive pulmonary disease) ([place])   . Shortness of breath dyspnea    with exertion was a smoker for many year per patient   . Thyroid disease     Patient Active Problem List   Diagnosis Date Noted  . COPD GOLD I  07/05/2017  . Multiple pulmonary nodules determined by computed tomography of lung 07/05/2017  . Acute medial meniscal tear 01/29/2015    Past Surgical History:  Procedure Laterality Date  . BREAST BIOPSY    . KNEE ARTHROSCOPY Right 01/29/2015   Procedure: RIGHT KNEE ARTHROSCOPY WITH DEBRIDEMENT  ;  Surgeon: Gaynelle Arabian, MD;  Location: [facility];  Service: Orthopedics;  Laterality: Right;  . THYROIDECTOMY, PARTIAL  1998    OB History   No obstetric history on file.      Home Medications    Prior to Admission medications   Medication Sig Start Date End Date Taking? Authorizing Provider  apixaban  (ELIQUIS) 5 MG TABS tablet Take 5 mg by mouth.    [provider]  Calcium Carbonate-Vitamin D (CALCIUM + D PO) Take 1 tablet by mouth daily.     [provider]  Diethylpropion HCl CR 75 MG TB24 Take 1 tablet by mouth daily.    [provider]  fluconazole (DIFLUCAN) 150 MG tablet Take 1 tablet (150 mg total) by mouth daily. 04/06/19   Araeya Lamb, Tressia Miners A, NP  levothyroxine (SYNTHROID, LEVOTHROID) 100 MCG tablet Take 100 mcg by mouth daily before breakfast.    [provider]  metroNIDAZOLE (FLAGYL) 500 MG tablet Take 1 tablet (500 mg total) by mouth 2 (two) times daily. 04/06/19   Loura Halt A, NP  Multiple Vitamins-Minerals (PRESERVISION AREDS 2 PO) Take 1 tablet by mouth 2 (two) times daily.    [provider]  nitroGLYCERIN (NITRODUR - DOSED IN MG/24 HR) 0.2 mg/hr patch Use 1/4 patch daily to the affected area 11/02/17   [facility], Christoper P, MD    Family History Family History  Problem Relation Age of Onset  . Colon cancer Neg Hx     Social History Social History   Tobacco Use  . Smoking status: Former Smoker    Packs/day: 2.00    Years: 35.00    Pack years: 70.00    Types: Cigarettes  Last attempt to quit: 11/22/2006    Years since quitting: 12.3  . Smokeless tobacco: Never Used  Substance Use Topics  . Alcohol use: Yes    Alcohol/week: 7.0 standard drinks    Types: 7 Glasses of wine per week    Comment: 2 glasses of wine per night  . Drug use: No     Allergies   Patient has no known allergies.   Review of Systems Review of Systems   Physical Exam Triage Vital Signs ED Triage Vitals  Enc Vitals Group     BP 04/06/19 1336 118/77     Pulse Rate 04/06/19 1336 95     Resp 04/06/19 1336 18     Temp 04/06/19 1336 99.5 F (37.5 C)     Temp Source 04/06/19 1336 Oral     SpO2 --      Weight --      Height --      Head Circumference --      Peak Flow --      Pain Score 04/06/19 1338 0     Pain Loc --      Pain Edu? --       Excl. in [place]? --    No data found.  Updated Vital Signs BP 118/77 (BP Location: Left Arm)   Pulse 95   Temp 99.5 F (37.5 C) (Oral)   Resp 18   Visual Acuity Right Eye Distance:   Left Eye Distance:   Bilateral Distance:    Right Eye Near:   Left Eye Near:    Bilateral Near:     Physical Exam Vitals signs and nursing note reviewed.  Constitutional:      General: She is not in acute distress.    Appearance: Normal appearance. She is not ill-appearing, toxic-appearing or diaphoretic.  HENT:     Head: Normocephalic and atraumatic.  Eyes:     Conjunctiva/sclera: Conjunctivae normal.  Neck:     Musculoskeletal: Normal range of motion.  Pulmonary:     Effort: Pulmonary effort is normal.  Genitourinary:    Vagina: Vaginal discharge present.     Comments: External vaginal area with erythema and both hyperpigmented and hypopigmented areas. A few scattered papules. Severe erythema at vaginal opening and inside the vaginal vault.  Cervix mildly friable and tender to touch.  Clear discharge noted.  No odor.  Musculoskeletal: Normal range of motion.  Skin:    General: Skin is warm and dry.  Neurological:     General: No focal deficit present.     Mental Status: She is alert.  Psychiatric:        Mood and Affect: Mood normal.      UC Treatments / Results  Labs (all labs ordered are listed, but only abnormal results are displayed) Labs Reviewed  POCT URINALYSIS DIP (DEVICE) - Abnormal; Notable for the following components:      Result Value   Hgb urine dipstick SMALL (*)    Leukocytes,Ua SMALL (*)    All other components within normal limits  URINE CULTURE  CERVICOVAGINAL ANCILLARY ONLY    EKG None  Radiology No results found.  Procedures Procedures (including critical care time)  Medications Ordered in UC Medications  cefTRIAXone (ROCEPHIN) injection 250 mg (250 mg Intramuscular Given 04/06/19 1448)    Initial Impression / Assessment and Plan / UC  Course  I have reviewed the triage vital signs and the nursing notes.  Pertinent labs & imaging results that were available during  my care of the patient were reviewed by me and considered in my medical decision making (see chart for details).     Urine with trace lueks and blood which could be from vaginal infection and  irration. Will send for culture.  Vaginal exam with extremely erythematous vaginal vault and opening. Clear discharge. No odor appreciated but was wearing a mask. Mild cervical friability. Some external skin color changes.  I am treating for cervicitis and vaginal infection most likely due to [place] Not likely STD.  Treating for yeast also.  Also gave rocephin injection in clinic Her labs are pending and instructed that we will call with results.  If her symptoms are not improved or worse by Monday she needs to see her OB/GYN.  Pt understanding and agreed.     Final Clinical Impressions(s) / UC Diagnoses   Final diagnoses:  Acute vaginitis  Vaginal discharge     Discharge Instructions     We are treating you for vaginal infection with two different medications Take as prescribed.  If your symptoms are not improved by Monday or worse follow up with GYN Your test results are pending and we will call with results.      ED Prescriptions    Medication Sig Dispense Auth. Provider   doxycycline (VIBRAMYCIN) 100 MG capsule  (Status: Discontinued) Take 1 capsule (100 mg total) by mouth 2 (two) times daily for 14 days. 28 capsule Graycen Degan A, NP   fluconazole (DIFLUCAN) 150 MG tablet Take 1 tablet (150 mg total) by mouth daily. 2 tablet Sanjith Siwek A, NP   doxycycline (VIBRAMYCIN) 100 MG capsule  (Status: Discontinued) Take 1 capsule (100 mg total) by mouth 2 (two) times daily for 14 days. 28 capsule Takeila Thayne A, NP   metroNIDAZOLE (FLAGYL) 500 MG tablet Take 1 tablet (500 mg total) by mouth 2 (two) times daily. 14 tablet Loura Halt A, NP     Controlled Substance  Prescriptions [place] Controlled Substance Registry consulted? Not Applicable   Orvan July, NP 04/06/19 [phone]

## 2019-04-06 NOTE — Discharge Instructions (Addendum)
We are treating you for vaginal infection with two different medications Take as prescribed.  If your symptoms are not improved by Monday or worse follow up with GYN Your test results are pending and we will call with results.

## 2019-04-06 NOTE — ED Triage Notes (Signed)
Pt states having a fever and SOB for the past 3 weeks, tested neg. for COVID 19. States still has a fever and also having a clear vaginal discharge that her PCP encouraged her to be seen for today. Pt states it's uncomfortable to urinate.

## 2019-04-07 LAB — URINE CULTURE: Culture: NO GROWTH

## 2019-04-09 ENCOUNTER — Telehealth (HOSPITAL_COMMUNITY): Payer: Self-pay | Admitting: Emergency Medicine

## 2019-04-09 LAB — CERVICOVAGINAL ANCILLARY ONLY
Bacterial vaginitis: NEGATIVE
Candida vaginitis: NEGATIVE
Chlamydia: NEGATIVE
Neisseria Gonorrhea: NEGATIVE
Trichomonas: NEGATIVE

## 2019-04-09 NOTE — Telephone Encounter (Signed)
Attempted to reach patient, no answer.

## 2019-05-04 ENCOUNTER — Ambulatory Visit: Payer: Medicare Other | Admitting: Psychology

## 2019-05-23 ENCOUNTER — Other Ambulatory Visit: Payer: Self-pay

## 2019-05-23 ENCOUNTER — Encounter: Payer: Self-pay | Admitting: Family

## 2019-05-23 ENCOUNTER — Ambulatory Visit (INDEPENDENT_AMBULATORY_CARE_PROVIDER_SITE_OTHER): Payer: Medicare Other | Admitting: Family

## 2019-05-23 VITALS — BP 108/76 | HR 92 | Temp 98.0°F | Ht 69.5 in | Wt 170.8 lb

## 2019-05-23 DIAGNOSIS — E2839 Other primary ovarian failure: Secondary | ICD-10-CM

## 2019-05-23 DIAGNOSIS — R0609 Other forms of dyspnea: Secondary | ICD-10-CM

## 2019-05-23 DIAGNOSIS — E039 Hypothyroidism, unspecified: Secondary | ICD-10-CM

## 2019-05-23 DIAGNOSIS — Z13220 Encounter for screening for lipoid disorders: Secondary | ICD-10-CM

## 2019-05-23 DIAGNOSIS — Z1211 Encounter for screening for malignant neoplasm of colon: Secondary | ICD-10-CM

## 2019-05-23 DIAGNOSIS — J449 Chronic obstructive pulmonary disease, unspecified: Secondary | ICD-10-CM | POA: Insufficient documentation

## 2019-05-23 DIAGNOSIS — Z87891 Personal history of nicotine dependence: Secondary | ICD-10-CM | POA: Diagnosis not present

## 2019-05-23 HISTORY — DX: Chronic obstructive pulmonary disease, unspecified: J44.9

## 2019-05-23 MED ORDER — SPIRIVA HANDIHALER 18 MCG IN CAPS: 18.0000 ug | ORAL_CAPSULE | Freq: Every day | RESPIRATORY_TRACT | 1 refills | Status: DC

## 2019-05-23 NOTE — Progress Notes (Signed)
Candace Frey is a 68 y.o. female with the following history as recorded in EpicCare:  Patient Active Problem List   Diagnosis Date Noted  . COPD GOLD I  07/05/2017  . Multiple pulmonary nodules determined by computed tomography of lung 07/05/2017  . Acute medial meniscal tear 01/29/2015    Current Outpatient Medications  Medication Sig Dispense Refill  . Calcium Carbonate-Vitamin D (CALCIUM + D PO) Take 1 tablet by mouth daily.     . Estradiol 0.75 MG/1.25 GM (0.06%) topical gel Place 1.25 g onto the skin daily.    Marland Kitchen levothyroxine (SYNTHROID, LEVOTHROID) 100 MCG tablet Take 100 mcg by mouth daily before breakfast.    . Multiple Vitamins-Minerals (PRESERVISION AREDS 2 PO) Take 1 tablet by mouth 2 (two) times daily.    Marland Kitchen PROAIR HFA 108 (90 Base) MCG/ACT inhaler     . progesterone (PROMETRIUM) 100 MG capsule progesterone micronized 100 mg capsule  Take 1 capsule every day by oral route.    . valACYclovir (VALTREX) 1000 MG tablet Take 1,000 mg by mouth every 12 (twelve) hours.    Marland Kitchen tiotropium (SPIRIVA HANDIHALER) 18 MCG inhalation capsule Place 1 capsule (18 mcg total) into inhaler and inhale daily. 30 capsule 1   No current facility-administered medications for this visit.     Allergies: Patient has no known allergies.  Past Medical History:  Diagnosis Date  . Complication of anesthesia    slow to wake up   . COPD (chronic obstructive pulmonary disease) ([place])   . Shortness of breath dyspnea    with exertion was a smoker for many year per patient   . Thyroid disease     Past Surgical History:  Procedure Laterality Date  . BREAST BIOPSY    . KNEE ARTHROSCOPY Right 01/29/2015   Procedure: RIGHT KNEE ARTHROSCOPY WITH DEBRIDEMENT  ;  Surgeon: Gaynelle Arabian, MD;  Location: [facility];  Service: Orthopedics;  Laterality: Right;  . THYROIDECTOMY, PARTIAL  1998    Family History  Problem Relation Age of Onset  . Colon cancer Neg Hx     Social History   Tobacco Use  . Smoking  status: Former Smoker    Packs/day: 2.00    Years: 35.00    Pack years: 70.00    Types: Cigarettes    Quit date: 11/22/2006    Years since quitting: 12.5  . Smokeless tobacco: Never Used  Substance Use Topics  . Alcohol use: Yes    Alcohol/week: 7.0 standard drinks    Types: 7 Glasses of wine per week    Comment: 2 glasses of wine per night    Subjective:  Patient presents today as a new patient; numerous concerns about her chronic health care needs:  1)  COPD diagnosed in 2018; feels like having increased dyspnea on exertion especially when walking on hills; does not have chest pain on exertion however; only has albuterol inhaler at this time; 2) Former smoker- interested in lung cancer screening 3) History of hypothyroidism/ thyroid nodule- would be interested in establishing with endocrine for follow-up and management 4) Due for colonoscopy in October 2020; 5) Does have GYN- up to date on mammogram; overdue for DEXA;  Objective:  Vitals:   05/23/19 0907  BP: 108/76  Pulse: 92  Temp: 98 F (36.7 C)  TempSrc: Oral  SpO2: 97%  Weight: 170 lb 12.8 oz (77.5 kg)  Height: 5' 9.5" (1.765 m)    General: Well developed, well nourished, in no acute distress  Skin :  Warm and dry.  Head: Normocephalic and atraumatic  Lungs: Respirations unlabored; clear to auscultation bilaterally without wheeze, rales, rhonchi  CVS exam: normal rate and regular rhythm.  Neurologic: Alert and oriented; speech intact; face symmetrical; moves all extremities well; CNII-XII intact without focal deficit   Assessment:  1. Dyspnea on exertion   2. History of tobacco abuse   3. Hypothyroidism, unspecified type   4. Ovarian failure   5. Encounter for screening colonoscopy     Plan:  1. Trial of Spiriva- use daily as directed; refer to cardiology for further evaluation as well; 2. Schedule for lung cancer screen; 3. Refer to endocrine for further evaluation; 4. Plan for DEXA at next OV; 5. Referral  updated to [facility];  Return for AWV with Jill/ needs labs and DEXA on same day.  Orders Placed This Encounter  Procedures  . DG Bone Density    Standing Status:   Future    Standing Expiration Date:   07/23/2020    Order Specific Question:   Reason for Exam (SYMPTOM  OR DIAGNOSIS REQUIRED)    Answer:   ovarian failure    Order Specific Question:   Preferred imaging location?    Answer:   [facility]  . Ambulatory referral to Cardiology    Referral Priority:   Routine    Referral Type:   Consultation    Referral Reason:   Specialty Services Required    Requested Specialty:   Cardiology    Number of Visits Requested:   1  . Ambulatory Referral for Lung Cancer Scre    Referral Priority:   Routine    Referral Type:   Consultation    Referral Reason:   Specialty Services Required    Number of Visits Requested:   1  . Ambulatory referral to Endocrinology    Referral Priority:   Routine    Referral Type:   Consultation    Referral Reason:   Specialty Services Required    Number of Visits Requested:   1  . Ambulatory referral to Gastroenterology    Referral Priority:   Routine    Referral Type:   Consultation    Referral Reason:   Specialty Services Required    Referred to Provider:   Jerene Bears, MD    Number of Visits Requested:   1    Requested Prescriptions   Signed Prescriptions Disp Refills  . tiotropium (SPIRIVA HANDIHALER) 18 MCG inhalation capsule 30 capsule 1    Sig: Place 1 capsule (18 mcg total) into inhaler and inhale daily.

## 2019-05-31 ENCOUNTER — Telehealth: Payer: Self-pay

## 2019-05-31 NOTE — Telephone Encounter (Signed)
Rec'd records from [place] forwarded 165 pages to [facility]

## 2019-06-07 ENCOUNTER — Other Ambulatory Visit: Payer: Self-pay | Admitting: *Deleted

## 2019-06-07 DIAGNOSIS — Z122 Encounter for screening for malignant neoplasm of respiratory organs: Secondary | ICD-10-CM

## 2019-06-07 DIAGNOSIS — Z87891 Personal history of nicotine dependence: Secondary | ICD-10-CM

## 2019-06-19 ENCOUNTER — Ambulatory Visit (INDEPENDENT_AMBULATORY_CARE_PROVIDER_SITE_OTHER): Payer: Medicare Other | Admitting: Psychology

## 2019-06-19 DIAGNOSIS — F411 Generalized anxiety disorder: Secondary | ICD-10-CM

## 2019-06-20 ENCOUNTER — Ambulatory Visit: Payer: Medicare Other | Admitting: Psychology

## 2019-06-20 ENCOUNTER — Ambulatory Visit (INDEPENDENT_AMBULATORY_CARE_PROVIDER_SITE_OTHER): Payer: Medicare Other | Admitting: Psychology

## 2019-06-20 DIAGNOSIS — F411 Generalized anxiety disorder: Secondary | ICD-10-CM | POA: Diagnosis not present

## 2019-06-21 NOTE — Progress Notes (Addendum)
Subjective:   Candace Frey is a 68 y.o. female who presents for an Initial Medicare Annual Wellness Visit.  Review of Systems     Cardiac Risk Factors include: advanced age (>12men, >18 women) Sleep patterns: feels rested on waking, gets up 0-1 times nightly to void and sleeps 8 hours nightly.    Home Safety/Smoke Alarms: Feels safe in home. Smoke alarms in place.  Living environment; residence and Firearm Safety: 1-story house/ trailer Lives with husband, no needs for DME, good support system. Seat Belt Safety/Bike Helmet: Wears seat belt.     Objective:    Today's Vitals   06/22/19 1034  BP: 106/78  Pulse: 70  Resp: 17  SpO2: 98%  Weight: 171 lb (77.6 kg)  Height: 5\' 10"  (1.778 m)   Body mass index is 24.54 kg/m.  Advanced Directives 06/22/2019 01/30/2017 01/29/2015 01/23/2015 09/02/2014  Does Patient Have a Medical Advance Directive? No No No No No  Would patient like information on creating a medical advance directive? Yes (MAU/Ambulatory/Procedural Areas - Information given) No - Patient declined Yes - Educational materials given - -    Current Medications (verified) Outpatient Encounter Medications as of 06/22/2019  Medication Sig  . acetaminophen (TYLENOL) 325 MG tablet Take 650 mg by mouth every 6 (six) hours as needed.  . Calcium Carbonate-Vitamin D (CALCIUM + D PO) Take 1 tablet by mouth daily.   . Estradiol 0.75 MG/1.25 GM (0.06%) topical gel Place 1.25 g onto the skin daily.  . Ibuprofen (ADVIL) 200 MG CAPS Take by mouth.  . levothyroxine (SYNTHROID, LEVOTHROID) 100 MCG tablet Take 100 mcg by mouth daily before breakfast.  . Multiple Vitamin (MULTIVITAMIN) tablet Take 1 tablet by mouth daily.  . Multiple Vitamins-Minerals (PRESERVISION AREDS 2 PO) Take 1 tablet by mouth 2 (two) times daily.  Marland Kitchen PROAIR HFA 108 (90 Base) MCG/ACT inhaler   . progesterone (PROMETRIUM) 100 MG capsule progesterone micronized 100 mg capsule  Take 1 capsule every day by oral route.   . tiotropium (SPIRIVA HANDIHALER) 18 MCG inhalation capsule Place 1 capsule (18 mcg total) into inhaler and inhale daily.  . valACYclovir (VALTREX) 1000 MG tablet Take 1,000 mg by mouth every 12 (twelve) hours.    No facility-administered encounter medications on file as of 06/22/2019.     Allergies (verified) Patient has no known allergies.   History: Past Medical History:  Diagnosis Date  . Chicken pox   . Colon polyps   . Complication of anesthesia    slow to wake up   . COPD (chronic obstructive pulmonary disease) ([place])   . Genital warts   . Shortness of breath dyspnea    with exertion was a smoker for many year per patient   . Thyroid disease   . UTI (urinary tract infection)    Past Surgical History:  Procedure Laterality Date  . BREAST BIOPSY    . KNEE ARTHROSCOPY Right 01/29/2015   Procedure: RIGHT KNEE ARTHROSCOPY WITH DEBRIDEMENT  ;  Surgeon: Gaynelle Arabian, MD;  Location: [facility];  Service: Orthopedics;  Laterality: Right;  . THYROIDECTOMY, PARTIAL  1998   Family History  Problem Relation Age of Onset  . Colon cancer Neg Hx    Social History   Socioeconomic History  . Marital status: Married    Spouse name: Not on file  . Number of children: 3  . Years of education: Not on file  . Highest education level: Not on file  Occupational History  . Occupation: part-time  Social Needs  . Financial resource strain: Not hard at all  . Food insecurity    Worry: Never true    Inability: Never true  . Transportation needs    Medical: Yes    Non-medical: Yes  Tobacco Use  . Smoking status: Former Smoker    Packs/day: 2.00    Years: 35.00    Pack years: 70.00    Types: Cigarettes    Quit date: 11/22/2006    Years since quitting: 12.5  . Smokeless tobacco: Never Used  Substance and Sexual Activity  . Alcohol use: Yes    Alcohol/week: 7.0 standard drinks    Types: 7 Glasses of wine per week    Comment: 2 glasses of wine per night  . Drug use: No  . Sexual  activity: Yes  Lifestyle  . Physical activity    Days per week: 2 days    Minutes per session: 30 min  . Stress: Not at all  Relationships  . Social connections    Talks on phone: More than three times a week    Gets together: More than three times a week    Attends religious service: More than 4 times per year    Active member of club or organization: Yes    Attends meetings of clubs or organizations: More than 4 times per year    Relationship status: Married  Other Topics Concern  . Not on file  Social History Narrative  . Not on file    Tobacco Counseling Counseling given: Not Answered  Activities of Daily Living In your present state of health, do you have any difficulty performing the following activities: 06/22/2019  Hearing? N  Vision? N  Difficulty concentrating or making decisions? N  Walking or climbing stairs? N  Dressing or bathing? N  Doing errands, shopping? N  Preparing Food and eating ? N  Using the Toilet? N  In the past six months, have you accidently leaked urine? N  Do you have problems with loss of bowel control? N  Managing your Medications? N  Managing your Finances? N  Housekeeping or managing your Housekeeping? N  Some recent data might be hidden     Immunizations and Health Maintenance  There is no immunization history on file for this patient. Health Maintenance Due  Topic Date Due  . Hepatitis C Screening  1951/11/16  . TETANUS/TDAP  06/02/1968  . PNA vac Low Risk Adult (1 of 2 - PCV13) 06/02/2014  . MAMMOGRAM  03/15/2019    Patient Care Team: Marrian Salvage, [place] as PCP - General (Internal Medicine)  Indicate any recent Medical Services you may have received from other than [facility] providers in the past year (date may be approximate).     Assessment:   This is a routine wellness examination for Candace Frey. Physical assessment deferred to PCP.  Hearing/Vision screen  Hearing Screening   125Hz  250Hz  500Hz  1000Hz  2000Hz  3000Hz   4000Hz  6000Hz  8000Hz   Right ear:           Left ear:           Comments: Able to hear conversational tones w/o difficulty. No issues reported.    Vision Screening Comments: appointment yearly Dr. Prudencio Burly macular degeneration  Dietary issues and exercise activities discussed: Current Exercise Habits: Home exercise routine;Structured exercise class, Type of exercise: walking(pilates), Time (Minutes): 30, Frequency (Times/Week): 3, Weekly Exercise (Minutes/Week): 90, Intensity: Mild, Exercise limited by: None identified  Diet (meal preparation, eat out, water intake, caffeinated  beverages, dairy products, fruits and vegetables): in general, a "healthy" diet  , well balanced   Encouraged patient to increase daily water and healthy fluid intake.   Goals    . Patient Stated     I want to get back to pilates and walk more routinely.      Depression Screen PHQ 2/9 Scores 06/22/2019  PHQ - 2 Score 0    Fall Risk Fall Risk  06/22/2019  Falls in the past year? 0  Number falls in past yr: 0    Cognitive Function:       Ad8 score reviewed for issues:  Issues making decisions: no  Less interest in hobbies / activities: no  Repeats questions, stories (family complaining): no  Trouble using ordinary gadgets (microwave, computer, phone):no  Forgets the month or year: no  Mismanaging finances: no  Remembering appts: no  Daily problems with thinking and/or memory: no Ad8 score is= 0  Screening Tests Health Maintenance  Topic Date Due  . Hepatitis C Screening  October 14, 1951  . TETANUS/TDAP  06/02/1968  . PNA vac Low Risk Adult (1 of 2 - PCV13) 06/02/2014  . MAMMOGRAM  03/15/2019  . INFLUENZA VACCINE  06/23/2019  . COLONOSCOPY  09/03/2019  . DEXA SCAN  Completed     Plan:    Reviewed health maintenance screenings with patient today and relevant education, vaccines, and/or referrals were provided.   I have personally reviewed and noted the following in the patient's chart:    . Medical and social history . Use of alcohol, tobacco or illicit drugs  . Current medications and supplements . Functional ability and status . Nutritional status . Physical activity . Advanced directives . List of other physicians . Vitals . Screenings to include cognitive, depression, and falls . Referrals and appointments  In addition, I have reviewed and discussed with patient certain preventive protocols, quality metrics, and best practice recommendations. A written personalized care plan for preventive services as well as general preventive health recommendations were provided to patient.     Michiel Cowboy, RN   06/22/2019    Medical screening examination/treatment/procedure(s) were performed by non-physician practitioner and as supervising provider I was immediately available for consultation/collaboration.  I agree with above. Marrian Salvage, FNP

## 2019-06-22 ENCOUNTER — Ambulatory Visit: Payer: Medicare Other | Admitting: Psychology

## 2019-06-22 ENCOUNTER — Other Ambulatory Visit: Payer: Self-pay

## 2019-06-22 ENCOUNTER — Ambulatory Visit
Admission: RE | Admit: 2019-06-22 | Discharge: 2019-06-22 | Disposition: A | Discharge status: Home / Self Care | Payer: Medicare Other | Source: Ambulatory Visit | Attending: Family | Admitting: Family

## 2019-06-22 ENCOUNTER — Ambulatory Visit (INDEPENDENT_AMBULATORY_CARE_PROVIDER_SITE_OTHER): Payer: Medicare Other | Admitting: *Deleted

## 2019-06-22 VITALS — BP 106/78 | HR 70 | Resp 17 | Ht 70.0 in | Wt 171.0 lb

## 2019-06-22 DIAGNOSIS — Z0000 Encounter for general adult medical examination without abnormal findings: Secondary | ICD-10-CM

## 2019-06-22 DIAGNOSIS — E2839 Other primary ovarian failure: Secondary | ICD-10-CM

## 2019-06-22 NOTE — Patient Instructions (Signed)
Continue doing brain stimulating activities (puzzles, reading, adult coloring books, staying active) to keep memory sharp.   Continue to eat heart healthy diet (full of fruits, vegetables, whole grains, lean protein, water--limit salt, fat, and sugar intake) and increase physical activity as tolerated.   [name] , Thank you for taking time to come for your Medicare Wellness Visit. I appreciate your ongoing commitment to your health goals. Please review the following plan we discussed and let me know if I can assist you in the future.   These are the goals we discussed: Goals    . Patient Stated     I want to get back to pilates and walk more routinely.       This is a list of the screening recommended for you and due dates:  Health Maintenance  Topic Date Due  .  Hepatitis C: One time screening is recommended by Center for Disease Control  (CDC) for  adults born from [birth year] through 1967.   07-16-1951  . Tetanus Vaccine  06/02/1968  . Pneumonia vaccines (1 of 2 - PCV13) 06/02/2014  . Mammogram  03/15/2019  . Flu Shot  06/23/2019  . Colon Cancer Screening  09/03/2019  . DEXA scan (bone density measurement)  Completed    Preventive Care 65 Years and Older, Female Preventive care refers to lifestyle choices and visits with your health care provider that can promote health and wellness. This includes:  A yearly physical exam. This is also called an annual well check.  Regular dental and eye exams.  Immunizations.  Screening for certain conditions.  Healthy lifestyle choices, such as diet and exercise. What can I expect for my preventive care visit? Physical exam Your health care provider will check:  Height and weight. These may be used to calculate body mass index (BMI), which is a measurement that tells if you are at a healthy weight.  Heart rate and blood pressure.  Your skin for abnormal spots. Counseling Your health care provider may ask you questions about:   Alcohol, tobacco, and drug use.  Emotional well-being.  Home and relationship well-being.  Sexual activity.  Eating habits.  History of falls.  Memory and ability to understand (cognition).  Work and work Statistician.  Pregnancy and menstrual history. What immunizations do I need?  Influenza (flu) vaccine  This is recommended every year. Tetanus, diphtheria, and pertussis (Tdap) vaccine  You may need a Td booster every 10 years. Varicella (chickenpox) vaccine  You may need this vaccine if you have not already been vaccinated. Zoster (shingles) vaccine  You may need this after age 110. Pneumococcal conjugate (PCV13) vaccine  One dose is recommended after age 19. Pneumococcal polysaccharide (PPSV23) vaccine  One dose is recommended after age 52. Measles, mumps, and rubella (MMR) vaccine  You may need at least one dose of MMR if you were born in 1957 or later. You may also need a second dose. Meningococcal conjugate (MenACWY) vaccine  You may need this if you have certain conditions. Hepatitis A vaccine  You may need this if you have certain conditions or if you travel or work in places where you may be exposed to hepatitis A. Hepatitis B vaccine  You may need this if you have certain conditions or if you travel or work in places where you may be exposed to hepatitis B. Haemophilus influenzae type b (Hib) vaccine  You may need this if you have certain conditions. You may receive vaccines as individual doses or as  more than one vaccine together in one shot (combination vaccines). Talk with your health care provider about the risks and benefits of combination vaccines. What tests do I need? Blood tests  Lipid and cholesterol levels. These may be checked every 5 years, or more frequently depending on your overall health.  Hepatitis C test.  Hepatitis B test. Screening  Lung cancer screening. You may have this screening every year starting at age 44 if you have  a 30-pack-year history of smoking and currently smoke or have quit within the past 15 years.  Colorectal cancer screening. All adults should have this screening starting at age 31 and continuing until age 51. Your health care provider may recommend screening at age 62 if you are at increased risk. You will have tests every 1-10 years, depending on your results and the type of screening test.  Diabetes screening. This is done by checking your blood sugar (glucose) after you have not eaten for a while (fasting). You may have this done every 1-3 years.  Mammogram. This may be done every 1-2 years. Talk with your health care provider about how often you should have regular mammograms.  BRCA-related cancer screening. This may be done if you have a family history of breast, ovarian, tubal, or peritoneal cancers. Other tests  Sexually transmitted disease (STD) testing.  Bone density scan. This is done to screen for osteoporosis. You may have this done starting at age 21. Follow these instructions at home: Eating and drinking  Eat a diet that includes fresh fruits and vegetables, whole grains, lean protein, and low-fat dairy products. Limit your intake of foods with high amounts of sugar, saturated fats, and salt.  Take vitamin and mineral supplements as recommended by your health care provider.  Do not drink alcohol if your health care provider tells you not to drink.  If you drink alcohol: ? Limit how much you have to 0-1 drink a day. ? Be aware of how much alcohol is in your drink. In the [place] one drink equals one 12 oz bottle of beer (355 mL), one 5 oz glass of wine (148 mL), or one 1 oz glass of hard liquor (44 mL). Lifestyle  Take daily care of your teeth and gums.  Stay active. Exercise for at least 30 minutes on 5 or more days each week.  Do not use any products that contain nicotine or tobacco, such as cigarettes, e-cigarettes, and chewing tobacco. If you need help quitting, ask  your health care provider.  If you are sexually active, practice safe sex. Use a condom or other form of protection in order to prevent STIs (sexually transmitted infections).  Talk with your health care provider about taking a low-dose aspirin or statin. What's next?  Go to your health care provider once a year for a well check visit.  Ask your health care provider how often you should have your eyes and teeth checked.  Stay up to date on all vaccines. This information is not intended to replace advice given to you by your health care provider. Make sure you discuss any questions you have with your health care provider. Document Released: 12/05/2015 Document Revised: 11/02/2018 Document Reviewed: 11/02/2018 Elsevier Patient Education  2020 [employer].

## 2019-06-27 ENCOUNTER — Telehealth: Payer: Self-pay

## 2019-06-27 NOTE — Telephone Encounter (Signed)
Copied from [place] [id number]. Topic: General - Other >> Jun 26, 2019 10:54 AM Rainey Pines A wrote: Patient would like a callback from nurse in regards to what immunizations are due for her.

## 2019-06-29 NOTE — Telephone Encounter (Signed)
Sharee Pimple was working on this information. Did you get this from her or are you looking at our health maintenance tab?

## 2019-06-29 NOTE — Telephone Encounter (Signed)
So Epic won't reflect her other office's information. My understanding is that Candace Frey was going to call [place] to get this clarified for both the patient and her husband.

## 2019-07-02 ENCOUNTER — Other Ambulatory Visit: Payer: Self-pay

## 2019-07-02 ENCOUNTER — Ambulatory Visit (INDEPENDENT_AMBULATORY_CARE_PROVIDER_SITE_OTHER)
Admission: RE | Admit: 2019-07-02 | Discharge: 2019-07-02 | Disposition: A | Discharge status: Home / Self Care | Payer: Medicare Other | Source: Ambulatory Visit | Attending: Family | Admitting: Family

## 2019-07-02 DIAGNOSIS — E2839 Other primary ovarian failure: Secondary | ICD-10-CM

## 2019-07-03 ENCOUNTER — Telehealth: Payer: Self-pay

## 2019-07-03 NOTE — Telephone Encounter (Signed)
Yes, based on what I can see, she is fine; does not need vaccines.

## 2019-07-04 ENCOUNTER — Ambulatory Visit (INDEPENDENT_AMBULATORY_CARE_PROVIDER_SITE_OTHER): Payer: Medicare Other | Admitting: Psychology

## 2019-07-04 DIAGNOSIS — F411 Generalized anxiety disorder: Secondary | ICD-10-CM

## 2019-07-05 NOTE — Telephone Encounter (Signed)
Records located

## 2019-07-09 ENCOUNTER — Ambulatory Visit (INDEPENDENT_AMBULATORY_CARE_PROVIDER_SITE_OTHER)
Admission: RE | Admit: 2019-07-09 | Discharge: 2019-07-09 | Disposition: A | Discharge status: Home / Self Care | Payer: Medicare Other | Source: Ambulatory Visit | Attending: Acute Care | Admitting: Acute Care

## 2019-07-09 ENCOUNTER — Ambulatory Visit (INDEPENDENT_AMBULATORY_CARE_PROVIDER_SITE_OTHER): Payer: Medicare Other | Admitting: Acute Care

## 2019-07-09 ENCOUNTER — Other Ambulatory Visit: Payer: Self-pay

## 2019-07-09 ENCOUNTER — Other Ambulatory Visit: Payer: Self-pay | Admitting: *Deleted

## 2019-07-09 ENCOUNTER — Encounter: Payer: Self-pay | Admitting: Acute Care

## 2019-07-09 VITALS — BP 112/62 | HR 66 | Temp 98.4°F | Ht 69.0 in | Wt 170.6 lb

## 2019-07-09 DIAGNOSIS — R0600 Dyspnea, unspecified: Secondary | ICD-10-CM

## 2019-07-09 DIAGNOSIS — Z87891 Personal history of nicotine dependence: Secondary | ICD-10-CM | POA: Diagnosis not present

## 2019-07-09 DIAGNOSIS — Z122 Encounter for screening for malignant neoplasm of respiratory organs: Secondary | ICD-10-CM

## 2019-07-09 NOTE — Progress Notes (Signed)
Shared Decision Making Visit Lung Cancer Screening Program [phone])   Eligibility:  Age 68 y.o. Pack Years Smoking History Calculation 86 pack year smoking history (# packs/per year x # years smoked)  Recent History of coughing up blood  no  Unexplained weight loss? no ( >Than 15 pounds within the last 6 months )  Prior History Lung / other cancer no (Diagnosis within the last 5 years already requiring surveillance chest CT Scans).  Smoking Status Former Smoker  Former Smokers: Years since quit: 12 years  Quit Date: 2008  Visit Components:  Discussion included one or more decision making aids. yes  Discussion included risk/benefits of screening. yes  Discussion included potential follow up diagnostic testing for abnormal scans. yes  Discussion included meaning and risk of over diagnosis. yes  Discussion included meaning and risk of False Positives. yes  Discussion included meaning of total radiation exposure. yes  Counseling Included:  Importance of adherence to annual lung cancer LDCT screening. yes  Impact of comorbidities on ability to participate in the program. yes  Ability and willingness to under diagnostic treatment. yes  Smoking Cessation Counseling:  Current Smokers:   Discussed importance of smoking cessation. NA, former smoker  Information about tobacco cessation classes and interventions provided to patient. yes  Patient provided with "ticket" for LDCT Scan. yes  Symptomatic Patient. no  Counseling  Diagnosis Code: Tobacco Use Z72.0  Asymptomatic Patient yes  Counseling (Intermediate counseling: > three minutes counseling) S0630  Former Smokers:   Discussed the importance of maintaining cigarette abstinence. yes  Diagnosis Code: Personal History of Nicotine Dependence. Z60.109  Information about tobacco cessation classes and interventions provided to patient. Yes  Patient provided with "ticket" for LDCT Scan. yes  Written Order for  Lung Cancer Screening with LDCT placed in Epic. Yes (CT Chest Lung Cancer Screening Low Dose W/O CM) [id number] Z12.2-Screening of respiratory organs Z87.891-Personal history of nicotine dependence  I spent 25 minutes of face to face time with Ms. Quigley discussing the risks and benefits of lung cancer screening. We viewed a power point together that explained in detail the above noted topics. We took the time to pause the power point at intervals to allow for questions to be asked and answered to ensure understanding. We discussed that she had taken the single most powerful action possible to decrease her risk of developing lung cancer when she quit smoking. I counseled her to remain smoke free, and to contact me if she ever had the desire to smoke again so that I can provide resources and tools to help support the effort to remain smoke free. We discussed the time and location of the scan, and that either  Doroteo Glassman RN or I will call with the results within  24-48 hours of receiving them. She has my card and contact information in the event she needs to speak with me, in addition to a copy of the power point we reviewed as a resource. She verbalized understanding of all of the above and had no further questions upon leaving the office.     I explained to the patient that there has been a high incidence of coronary artery disease noted on these exams. I explained that this is a non-gated exam therefore degree or severity cannot be determined. This patient is not currently on statin therapy. I have asked the patient to follow-up with their PCP regarding any incidental finding of coronary artery disease and management with diet or medication as they feel  is clinically indicated. The patient verbalized understanding of the above and had no further questions.   Magdalen Spatz, AGACNP-BC [facility] 07/09/2019 4:24 PM

## 2019-07-09 NOTE — Telephone Encounter (Signed)
Spoke with Candace Frey and appointment cancelled.

## 2019-07-12 ENCOUNTER — Ambulatory Visit: Payer: Medicare Other

## 2019-07-13 ENCOUNTER — Other Ambulatory Visit: Payer: Self-pay

## 2019-07-17 ENCOUNTER — Other Ambulatory Visit: Payer: Self-pay

## 2019-07-17 ENCOUNTER — Ambulatory Visit (INDEPENDENT_AMBULATORY_CARE_PROVIDER_SITE_OTHER): Payer: Medicare Other | Admitting: Internal Medicine

## 2019-07-17 ENCOUNTER — Encounter: Payer: Self-pay | Admitting: Internal Medicine

## 2019-07-17 ENCOUNTER — Other Ambulatory Visit: Payer: Self-pay | Admitting: *Deleted

## 2019-07-17 VITALS — BP 104/60 | HR 75 | Temp 98.2°F | Ht 70.0 in | Wt 171.8 lb

## 2019-07-17 DIAGNOSIS — E039 Hypothyroidism, unspecified: Secondary | ICD-10-CM | POA: Diagnosis not present

## 2019-07-17 DIAGNOSIS — Z87891 Personal history of nicotine dependence: Secondary | ICD-10-CM

## 2019-07-17 DIAGNOSIS — Z122 Encounter for screening for malignant neoplasm of respiratory organs: Secondary | ICD-10-CM

## 2019-07-17 DIAGNOSIS — E042 Nontoxic multinodular goiter: Secondary | ICD-10-CM | POA: Diagnosis not present

## 2019-07-17 LAB — TSH: TSH: 1.12 u[IU]/mL (ref 0.35–4.50)

## 2019-07-17 LAB — T4, FREE: Free T4: 1.21 ng/dL (ref 0.60–1.60)

## 2019-07-17 NOTE — Patient Instructions (Signed)
-   You are on levothyroxine - which is your thyroid hormone supplement. You MUST take this consistently.  You should take this first thing in the morning on an empty stomach with water. You should not take it with other medications. Wait 17min to 1hr prior to eating. If you are taking any vitamins - please take these in the evening.   If you miss a dose, please take your missed dose the following day (double the dose for that day). You should have a pill box for ONLY levothyroxine on your bedside table to help you remember to take your medications.    - We will schedule you for an ultrasound, if you don;t hear about it in 3-4  weeks, please contact us

## 2019-07-17 NOTE — Progress Notes (Signed)
Name: Candace Frey  MRN/ DOB: [id number], 04/15/1951    Age/ Sex: 68 y.o., female    PCP: Marrian Salvage, FNP   Reason for Endocrinology Evaluation: Hypothyroidism     Date of Initial Endocrinology Evaluation: 07/17/2019     HPI: Candace Frey is a 68 y.o. female with a past medical history of COPD and hypothyroidism. The patient presented for initial endocrinology clinic visit on 07/17/2019 for consultative assistance with her hyopthyroidism.     She is S/P left lobectomy in 1999 due to benign reasons ( she can not recall). She has been on LT- 4 replacement since then .   She has been diagnosed with thyroid nodules many years ago, she used to have serial ultrasounds, then she moved to the area ~ 2014 and initially had a couple of ultrasound but was lost to follow up.    Today she is having hair loss, has gained weight, denies constipation and depression  Denies local neck symptoms , but notes having to be careful when eating.   She is compliant with LT-4 replacement , takes vitamins at the end of the day.   Takes Biotin - started a month ago    No FH of thyroid disease.    HISTORY:  Past Medical History:  Past Medical History:  Diagnosis Date  . Chicken pox   . Colon polyps   . Complication of anesthesia    slow to wake up   . COPD (chronic obstructive pulmonary disease) ([place])   . Genital warts   . Shortness of breath dyspnea    with exertion was a smoker for many year per patient   . Thyroid disease   . UTI (urinary tract infection)    Past Surgical History:  Past Surgical History:  Procedure Laterality Date  . BREAST BIOPSY    . KNEE ARTHROSCOPY Right 01/29/2015   Procedure: RIGHT KNEE ARTHROSCOPY WITH DEBRIDEMENT  ;  Surgeon: Gaynelle Arabian, MD;  Location: [facility];  Service: Orthopedics;  Laterality: Right;  . THYROIDECTOMY, PARTIAL  1998    Social History:  reports that she quit smoking about 12 years ago. Her smoking use included  cigarettes. She has a 86.00 pack-year smoking history. She has never used smokeless tobacco. She reports current alcohol use of about 7.0 standard drinks of alcohol per week. She reports that she does not use drugs. Family History: family history is not on file.   HOME MEDICATIONS: Allergies as of 07/17/2019   No Known Allergies     Medication List       Accurate as of July 17, 2019  9:44 AM. If you have any questions, ask your nurse or doctor.        acetaminophen 325 MG tablet Commonly known as: TYLENOL Take 650 mg by mouth every 6 (six) hours as needed.   Advil 200 MG Caps Generic drug: Ibuprofen Take by mouth.   CALCIUM + D PO Take 1 tablet by mouth daily.   Estradiol 0.75 MG/1.25 GM (0.06%) topical gel Place 1.25 g onto the skin daily.   levothyroxine 100 MCG tablet Commonly known as: SYNTHROID Take 100 mcg by mouth daily before breakfast.   multivitamin tablet Take 1 tablet by mouth daily.   PRESERVISION AREDS 2 PO Take 1 tablet by mouth 2 (two) times daily.   ProAir HFA 108 (90 Base) MCG/ACT inhaler Generic drug: albuterol   progesterone 100 MG capsule Commonly known as: PROMETRIUM progesterone micronized 100 mg capsule  Take 1 capsule every day by oral route.   Spiriva HandiHaler 18 MCG inhalation capsule Generic drug: tiotropium Place 1 capsule (18 mcg total) into inhaler and inhale daily.   valACYclovir 1000 MG tablet Commonly known as: VALTREX Take 1,000 mg by mouth every 12 (twelve) hours.         REVIEW OF SYSTEMS: A comprehensive ROS was conducted with the patient and is negative except as per HPI and below:  Review of Systems  Constitutional: Negative for fever and weight loss.  HENT: Negative for congestion and sore throat.   Respiratory: Negative for cough and shortness of breath.   Cardiovascular: Negative for chest pain and orthopnea.  Gastrointestinal: Negative for constipation and nausea.  Genitourinary: Negative for frequency.   Skin:       Hair loss  Neurological: Negative for tingling and tremors.  Endo/Heme/Allergies: Negative for polydipsia.       OBJECTIVE:  VS: BP 104/60 (BP Location: Left Arm, Patient Position: Sitting, Cuff Size: Large)   Pulse 75   Temp 98.2 F (36.8 C)   Ht 5\' 10"  (1.778 m)   Wt 171 lb 12.8 oz (77.9 kg)   SpO2 98%   BMI 24.65 kg/m    Wt Readings from Last 3 Encounters:  07/17/19 171 lb 12.8 oz (77.9 kg)  07/09/19 170 lb 9.6 oz (77.4 kg)  06/22/19 171 lb (77.6 kg)     EXAM: General: Pt appears well and is in NAD  Hydration: Well-hydrated with moist mucous membranes and good skin turgor  Eyes: External eye exam normal without stare, lid lag or exophthalmos.  EOM intact.    Ears, Nose, Throat: Hearing: Grossly intact bilaterally Dental: Good dentition  Throat: Clear without mass, erythema or exudate  Neck: General: Supple without adenopathy. Thyroid: Thyroid size normal on the right, left lobe surgically absent.  Lungs: Clear with good BS bilat with no rales, rhonchi, or wheezes  Heart: Auscultation: RRR.  Abdomen: Normoactive bowel sounds, soft, nontender, without masses or organomegaly palpable  Extremities: BL LE: No pretibial edema normal ROM and strength.  Skin: Hair: Texture and amount normal with gender appropriate distribution Skin Inspection: No rashes Skin Palpation: Skin temperature, texture, and thickness normal to palpation  Neuro: Cranial nerves: II - XII grossly intact  Motor: Normal strength throughout DTRs: 2+ and symmetric in UE without delay in relaxation phase  Mental Status: Judgment, insight: Intact Orientation: Oriented to time, place, and person Mood and affect: No depression, anxiety, or agitation     DATA REVIEWED:  Results for ZUHA, GRIECO" (MRN [id number]) as of 07/17/2019 15:11  Ref. Range 07/17/2019 09:28  TSH Latest Ref Range: 0.35 - 4.50 uIU/mL 1.12  T4,Free(Direct) Latest Ref Range: 0.60 - 1.60 ng/dL 1.21      ASSESSMENT/PLAN/RECOMMENDATIONS:   Right thyroid Nodules :  - She is clinically euthyroid - No local neck symptoms.  - She has had 2 thyroid ultrasounds in 2015 and 2016 with stability  - We did discuss the guidelines on thyroid nodules , if 5 year stability have been confirmed then no need to contiue serial ultrasound unless she develops local neck symptoms.  - Will repeat ultrasound  2. Hypothyroidism :   - Clinically she is euthyroid  - Pt advised to hold Biotin intake 48 hrs prior to future TFT checks - Pt educated extensively on the correct way to take levothyroxine (first thing in the morning with water, 30 minutes before eating or taking other medications). - Pt encouraged to  double dose the following day if she were to miss a dose given long half-life of levothyroxine.   Medications : Levothyroxine 100 mcg daily      F/u in 1 yr   Signed electronically by: Mack Guise, MD  [facility] Endocrinology  [address] [place], [place] [address] Phone: [phone] FAX: [phone]   CC: Marrian Salvage, [place] [address] Phone: [phone] Fax: [phone]   Return to Endocrinology clinic as below: Future Appointments  Date Time Provider [place]  07/18/2019 10:00 AM [place] Sherrilyn Rist, [place] [facility] None  08/08/2019 10:00 AM [place] Sherrilyn Rist, [place] [facility] None  08/15/2019  1:20 PM Sueanne Margarita, MD [facility]  06/27/2020  8:00 AM [place]  06/27/2020  9:20 AM Marrian Salvage, FNP [facility]  07/16/2020  8:50 AM [place], Melanie Crazier, MD [facility] None

## 2019-07-18 ENCOUNTER — Ambulatory Visit (INDEPENDENT_AMBULATORY_CARE_PROVIDER_SITE_OTHER): Payer: Medicare Other | Admitting: Psychology

## 2019-07-18 DIAGNOSIS — F411 Generalized anxiety disorder: Secondary | ICD-10-CM

## 2019-07-20 ENCOUNTER — Telehealth: Payer: Self-pay

## 2019-07-20 NOTE — Telephone Encounter (Signed)
Rec'd from [place] forwarded 178 pages to [facility]

## 2019-08-08 ENCOUNTER — Ambulatory Visit (INDEPENDENT_AMBULATORY_CARE_PROVIDER_SITE_OTHER): Payer: Medicare Other | Admitting: Psychology

## 2019-08-08 DIAGNOSIS — F411 Generalized anxiety disorder: Secondary | ICD-10-CM

## 2019-08-09 ENCOUNTER — Encounter: Payer: Self-pay | Admitting: Family

## 2019-08-10 ENCOUNTER — Other Ambulatory Visit: Payer: Self-pay | Admitting: Family

## 2019-08-10 MED ORDER — LEVOTHYROXINE SODIUM 100 MCG PO TABS: 100.0000 ug | ORAL_TABLET | Freq: Every day | ORAL | 3 refills | Status: DC

## 2019-08-15 ENCOUNTER — Encounter: Payer: Self-pay | Admitting: *Deleted

## 2019-08-15 ENCOUNTER — Encounter: Payer: Self-pay | Admitting: Cardiology

## 2019-08-15 ENCOUNTER — Ambulatory Visit (INDEPENDENT_AMBULATORY_CARE_PROVIDER_SITE_OTHER): Payer: Medicare Other | Admitting: Cardiology

## 2019-08-15 ENCOUNTER — Other Ambulatory Visit: Payer: Self-pay

## 2019-08-15 VITALS — BP 114/70 | HR 68 | Ht 70.0 in | Wt 175.0 lb

## 2019-08-15 DIAGNOSIS — R0602 Shortness of breath: Secondary | ICD-10-CM

## 2019-08-15 NOTE — Patient Instructions (Signed)
Medication Instructions:  Your physician recommends that you continue on your current medications as directed. Please refer to the Current Medication list given to you today.  If you need a refill on your cardiac medications before your next appointment, please call your pharmacy.   Lab work: None ordered  If you have labs (blood work) drawn today and your tests are completely normal, you will receive your results only by: Marland Kitchen MyChart Message (if you have MyChart) OR . A paper copy in the mail If you have any lab test that is abnormal or we need to change your treatment, we will call you to review the results.  Testing/Procedures: Your physician has requested that you have an echocardiogram. Echocardiography is a painless test that uses sound waves to create images of your heart. It provides your doctor with information about the size and shape of your heart and how well your heart's chambers and valves are working. This procedure takes approximately one hour. There are no restrictions for this procedure.   Your physician has requested that you have a lexiscan myoview. For further information please visit [email or web address]. Please follow instruction sheet, as given.  Your physician has requested that you have cardiac calcium scoring CT.   Follow-Up: At [facility], you and your health needs are our priority.  As part of our continuing mission to provide you with exceptional heart care, we have created designated Provider Care Teams.  These Care Teams include your primary Cardiologist (physician) and Advanced Practice Providers (APPs -  Physician Assistants and Nurse Practitioners) who all work together to provide you with the care you need, when you need it. You will need a follow up appointment in AS NEEDED  Any Other Special Instructions Will Be Listed Below (If Applicable).  Cardiac Nuclear Scan A cardiac nuclear scan is a test that is done to check the flow of blood to your heart.  It is done when you are resting and when you are exercising. The test looks for problems such as:  Not enough blood reaching a portion of the heart.  The heart muscle not working as it should. You may need this test if:  You have heart disease.  You have had lab results that are not normal.  You have had heart surgery or a balloon procedure to open up blocked arteries (angioplasty).  You have chest pain.  You have shortness of breath. In this test, a special dye (tracer) is put into your bloodstream. The tracer will travel to your heart. A camera will then take pictures of your heart to see how the tracer moves through your heart. This test is usually done at a hospital and takes 2-4 hours. Tell a doctor about:  Any allergies you have.  All medicines you are taking, including vitamins, herbs, eye drops, creams, and over-the-counter medicines.  Any problems you or family members have had with anesthetic medicines.  Any blood disorders you have.  Any surgeries you have had.  Any medical conditions you have.  Whether you are pregnant or may be pregnant. What are the risks? Generally, this is a safe test. However, problems may occur, such as:  Serious chest pain and heart attack. This is only a risk if the stress portion of the test is done.  Rapid heartbeat.  A feeling of warmth in your chest. This feeling usually does not last long.  Allergic reaction to the tracer. What happens before the test?  Ask your doctor about changing or stopping your  normal medicines. This is important.  Follow instructions from your doctor about what you cannot eat or drink.  Remove your jewelry on the day of the test. What happens during the test?  An IV tube will be inserted into one of your veins.  Your doctor will give you a small amount of tracer through the IV tube.  You will wait for 20-40 minutes while the tracer moves through your bloodstream.  Your heart will be monitored  with an electrocardiogram (ECG).  You will lie down on an exam table.  Pictures of your heart will be taken for about 15-20 minutes.  You may also have a stress test. For this test, one of these things may be done: ? You will be asked to exercise on a treadmill or a stationary bike. ? You will be given medicines that will make your heart work harder. This is done if you are unable to exercise.  When blood flow to your heart has peaked, a tracer will again be given through the IV tube.  After 20-40 minutes, you will get back on the exam table. More pictures will be taken of your heart.  Depending on the tracer that is used, more pictures may need to be taken 3-4 hours later.  Your IV tube will be removed when the test is over. The test may vary among doctors and hospitals. What happens after the test?  Ask your doctor: ? Whether you can return to your normal schedule, including diet, activities, and medicines. ? Whether you should drink more fluids. This will help to remove the tracer from your body. Drink enough fluid to keep your pee (urine) pale yellow.  Ask your doctor, or the department that is doing the test: ? When will my results be ready? ? How will I get my results? Summary  A cardiac nuclear scan is a test that is done to check the flow of blood to your heart.  Tell your doctor whether you are pregnant or may be pregnant.  Before the test, ask your doctor about changing or stopping your normal medicines. This is important.  Ask your doctor whether you can return to your normal activities. You may be asked to drink more fluids. This information is not intended to replace advice given to you by your health care provider. Make sure you discuss any questions you have with your health care provider. Document Released: 04/24/2018 Document Revised: 02/28/2019 Document Reviewed: 04/24/2018 Elsevier Patient Education  2020 [place].  Coronary Calcium Scan A coronary  calcium scan is an imaging test used to look for deposits of calcium and other fatty materials (plaques) in the inner lining of the blood vessels of the heart (coronary arteries). These deposits of calcium and plaques can partly clog and narrow the coronary arteries without producing any symptoms or warning signs. This puts a person at risk for a heart attack. This test can detect these deposits before symptoms develop. Tell a health care provider about:  Any allergies you have.  All medicines you are taking, including vitamins, herbs, eye drops, creams, and over-the-counter medicines.  Any problems you or family members have had with anesthetic medicines.  Any blood disorders you have.  Any surgeries you have had.  Any medical conditions you have.  Whether you are pregnant or may be pregnant. What are the risks? Generally, this is a safe procedure. However, problems may occur, including:  Harm to a pregnant woman and her unborn baby. This test involves the  use of radiation. Radiation exposure can be dangerous to a pregnant woman and her unborn baby. If you are pregnant, you generally should not have this procedure done.  Slight increase in the risk of cancer. This is because of the radiation involved in the test. What happens before the procedure? No preparation is needed for this procedure. What happens during the procedure?   You will undress and remove any jewelry around your neck or chest.  You will put on a hospital gown.  Sticky electrodes will be placed on your chest. The electrodes will be connected to an electrocardiogram (ECG) machine to record a tracing of the electrical activity of your heart.  A CT scanner will take pictures of your heart. During this time, you will be asked to lie still and hold your breath for 2-3 seconds while a picture of your heart is being taken. The procedure may vary among health care providers and hospitals. What happens after the procedure?   You can get dressed.  You can return to your normal activities.  It is up to you to get the results of your test. Ask your health care provider, or the department that is doing the test, when your results will be ready. Summary  A coronary calcium scan is an imaging test used to look for deposits of calcium and other fatty materials (plaques) in the inner lining of the blood vessels of the heart (coronary arteries).  Generally, this is a safe procedure. Tell your health care provider if you are pregnant or may be pregnant.  No preparation is needed for this procedure.  A CT scanner will take pictures of your heart.  You can return to your normal activities after the scan is done. This information is not intended to replace advice given to you by your health care provider. Make sure you discuss any questions you have with your health care provider. Document Released: 05/06/2008 Document Revised: 10/21/2017 Document Reviewed: 09/27/2016 Elsevier Patient Education  [place].  Echocardiogram An echocardiogram is a procedure that uses painless sound waves (ultrasound) to produce an image of the heart. Images from an echocardiogram can provide important information about:  Signs of coronary artery disease (CAD).  Aneurysm detection. An aneurysm is a weak or damaged part of an artery wall that bulges out from the normal force of blood pumping through the body.  Heart size and shape. Changes in the size or shape of the heart can be associated with certain conditions, including heart failure, aneurysm, and CAD.  Heart muscle function.  Heart valve function.  Signs of a past heart attack.  Fluid buildup around the heart.  Thickening of the heart muscle.  A tumor or infectious growth around the heart valves. Tell a health care provider about:  Any allergies you have.  All medicines you are taking, including vitamins, herbs, eye drops, creams, and over-the-counter  medicines.  Any blood disorders you have.  Any surgeries you have had.  Any medical conditions you have.  Whether you are pregnant or may be pregnant. What are the risks? Generally, this is a safe procedure. However, problems may occur, including:  Allergic reaction to dye (contrast) that may be used during the procedure. What happens before the procedure? No specific preparation is needed. You may eat and drink normally. What happens during the procedure?   An IV tube may be inserted into one of your veins.  You may receive contrast through this tube. A contrast is an injection that  improves the quality of the pictures from your heart.  A gel will be applied to your chest.  A wand-like tool (transducer) will be moved over your chest. The gel will help to transmit the sound waves from the transducer.  The sound waves will harmlessly bounce off of your heart to allow the heart images to be captured in real-time motion. The images will be recorded on a computer. The procedure may vary among health care providers and hospitals. What happens after the procedure?  You may return to your normal, everyday life, including diet, activities, and medicines, unless your health care provider tells you not to do that. Summary  An echocardiogram is a procedure that uses painless sound waves (ultrasound) to produce an image of the heart.  Images from an echocardiogram can provide important information about the size and shape of your heart, heart muscle function, heart valve function, and fluid buildup around your heart.  You do not need to do anything to prepare before this procedure. You may eat and drink normally.  After the echocardiogram is completed, you may return to your normal, everyday life, unless your health care provider tells you not to do that. This information is not intended to replace advice given to you by your health care provider. Make sure you discuss any questions you  have with your health care provider. Document Released: 11/05/2000 Document Revised: 03/01/2019 Document Reviewed: 12/11/2016 Elsevier Patient Education  2020 [employer].

## 2019-08-15 NOTE — Progress Notes (Signed)
Cardiology Office Note    Date:  08/15/2019   ID:  Yesena, Greenstein Oct 15, 1951, MRN [id number]  PCP:  Marrian Salvage, FNP  Cardiologist:  Fransico Him, MD   Chief Complaint  Patient presents with  . Shortness of Breath    History of Present Illness:  Candace Frey is a 68 y.o. female who is being seen today for the evaluation of DOE at the request of Valere Dross Marvis Repress,*.  This is a 68 yo female with a hx of COPD, hypothyroidism and remote RLE DVT who is referred to Cardiology for evaluation of SOB.  She is followed by Endocrinology for thyroid nodules and had a partial thyroidectomy in 1998. She used to smoke but quit 12 years ago.  She has a 50pack yr hx.    She tells me that she has chronic DOE with her COPD but over the past 6 months she has noticed worsening DOE.  This now occurs with any significant exertional activities.  She denies any chest pain or pressure, PND, orthopnea, LE edema, dizziness or palpitations.  She denies any associated nausea or diaphoresis with the SOB.  Her father's side of the family does not have CAD but her mom was adopted so she does not know her family hx.     Past Medical History:  Diagnosis Date  . Acute deep vein thrombosis (DVT) of distal vein of right lower extremity ([place]) 07/22/2016  . Acute medial meniscal tear 01/29/2015  . Chicken pox   . Chronic obstructive lung disease ([place]) 05/23/2019  . Colon polyps   . Complication of anesthesia    slow to wake up   . COPD (chronic obstructive pulmonary disease) ([place])   . Genital warts   . Multiple pulmonary nodules determined by computed tomography of lung 07/05/2017   CT 03/03/17  Right lower lobe nodularity described on the prior exam is less apparent today, favored to represent an area of scarring. other tiny nodules described on the prior are not readily apparent. No enlarging or dominant nodule identified > rec continue low dose annual  screening until 2023 per PCP (pt has  fm hx lung ca)   . Shortness of breath dyspnea    with exertion was a smoker for many year per patient   . Thyroid disease   . UTI (urinary tract infection)     Past Surgical History:  Procedure Laterality Date  . BREAST BIOPSY    . KNEE ARTHROSCOPY Right 01/29/2015   Procedure: RIGHT KNEE ARTHROSCOPY WITH DEBRIDEMENT  ;  Surgeon: Gaynelle Arabian, MD;  Location: [facility];  Service: Orthopedics;  Laterality: Right;  . THYROIDECTOMY, PARTIAL  1998    Current Medications: Current Meds  Medication Sig  . acetaminophen (TYLENOL) 325 MG tablet Take 650 mg by mouth every 6 (six) hours as needed.  . Calcium Carbonate-Vitamin D (CALCIUM + D PO) Take 1 tablet by mouth daily.   . Estradiol 0.75 MG/1.25 GM (0.06%) topical gel Place 1.25 g onto the skin daily.  . Ibuprofen (ADVIL) 200 MG CAPS Take by mouth.  . levothyroxine (SYNTHROID) 100 MCG tablet Take 1 tablet (100 mcg total) by mouth daily before breakfast.  . Multiple Vitamin (MULTIVITAMIN) tablet Take 1 tablet by mouth daily.  . Multiple Vitamins-Minerals (PRESERVISION AREDS 2 PO) Take 1 tablet by mouth 2 (two) times daily.  Marland Kitchen PROAIR HFA 108 (90 Base) MCG/ACT inhaler   . progesterone (PROMETRIUM) 100 MG capsule progesterone micronized 100 mg capsule  Take  1 capsule every day by oral route.  . tiotropium (SPIRIVA HANDIHALER) 18 MCG inhalation capsule Place 1 capsule (18 mcg total) into inhaler and inhale daily.    Allergies:   Patient has no known allergies.   Social History   Socioeconomic History  . Marital status: Married    Spouse name: Not on file  . Number of children: 3  . Years of education: Not on file  . Highest education level: Not on file  Occupational History  . Occupation: part-time  Social Needs  . Financial resource strain: Not hard at all  . Food insecurity    Worry: Never true    Inability: Never true  . Transportation needs    Medical: Yes    Non-medical: Yes  Tobacco Use  . Smoking status: Former Smoker     Packs/day: 2.00    Years: 43.00    Pack years: 86.00    Types: Cigarettes    Quit date: 11/22/2006    Years since quitting: 12.7  . Smokeless tobacco: Never Used  Substance and Sexual Activity  . Alcohol use: Yes    Alcohol/week: 7.0 standard drinks    Types: 7 Glasses of wine per week    Comment: 2 glasses of wine per night  . Drug use: No  . Sexual activity: Yes  Lifestyle  . Physical activity    Days per week: 2 days    Minutes per session: 30 min  . Stress: Not at all  Relationships  . Social connections    Talks on phone: More than three times a week    Gets together: More than three times a week    Attends religious service: More than 4 times per year    Active member of club or organization: Yes    Attends meetings of clubs or organizations: More than 4 times per year    Relationship status: Married  Other Topics Concern  . Not on file  Social History Narrative  . Not on file     Family History:  The patient's family history includes CVA in her father; Dementia in her mother; Lung cancer in her father; Parkinson's disease in her mother; Prostate cancer in her father.   ROS:   Please see the history of present illness.    ROS All other systems reviewed and are negative.  No flowsheet data found.     PHYSICAL EXAM:   VS:  BP 114/70   Pulse 68   Ht 5\' 10"  (1.778 m)   Wt 175 lb (79.4 kg)   SpO2 97%   BMI 25.11 kg/m    GEN: Well nourished, well developed, in no acute distress  HEENT: normal  Neck: no JVD, carotid bruits, or masses Cardiac: RRR; no murmurs, rubs, or gallops,no edema.  Intact distal pulses bilaterally.  Respiratory:  clear to auscultation bilaterally, normal work of breathing GI: soft, nontender, nondistended, + BS MS: no deformity or atrophy  Skin: warm and dry, no rash Neuro:  Alert and Oriented x 3, Strength and sensation are intact Psych: euthymic mood, full affect  Wt Readings from Last 3 Encounters:  08/15/19 175 lb (79.4 kg)   07/17/19 171 lb 12.8 oz (77.9 kg)  07/09/19 170 lb 9.6 oz (77.4 kg)      Studies/Labs Reviewed:   EKG:  EKG is ordered today.  The ekg ordered today demonstrates NSR with no ST change  Recent Labs: 07/17/2019: TSH 1.12   Lipid Panel No results found for: CHOL,  TRIG, HDL, CHOLHDL, VLDL, LDLCALC, LDLDIRECT  Additional studies/ records that were reviewed today include:  Office notes from PCP    ASSESSMENT:    1. SOB (shortness of breath)      PLAN:  In order of problems listed above:  1. DOE -unclear etiology - could be worsening COPD but also need to consider coronary ischemia -she has a significant hx of tobacco use.  Her mother was adopted so does not know fm hx on that side. -I will get a 2D echo to assess LVF and diastolic function -Lexiscan myoview to rule out ischemia -Chest CT calcium score to assess future risk.   -if studies are normal then refer to pulmonary  Medication Adjustments/Labs and Tests Ordered: Current medicines are reviewed at length with the patient today.  Concerns regarding medicines are outlined above.  Medication changes, Labs and Tests ordered today are listed in the Patient Instructions below.  There are no Patient Instructions on file for this visit.   Signed, Fransico Him, MD  08/15/2019 1:28 PM    [facility] [address], [place], [place]  [address] Phone: [phone]; Fax: [phone]

## 2019-08-17 ENCOUNTER — Telehealth: Payer: Self-pay | Admitting: Acute Care

## 2019-08-17 NOTE — Telephone Encounter (Signed)
Called and spoke to pt. Pt is requesting to change from Dr. Melvyn Novas to Dr. Valeta Harms. Pt last seen by SG for lung cancer screening in 06/2019 and last seen by MW in 06/2017 for COPD. Pt aware we will contact her to back to schedule appt.   Dr. Melvyn Novas please advise. Thanks.  Dr. Valeta Harms please advise. Thanks.

## 2019-08-18 NOTE — Telephone Encounter (Signed)
PCCM: ok with me Garner Nash, DO [facility] 08/18/2019 1:48 PM

## 2019-08-18 NOTE — Telephone Encounter (Signed)
Fine with me

## 2019-08-20 NOTE — Telephone Encounter (Signed)
Spoke with patient, she is aware of the approval. She has been scheduled to see BI at 3pm on 08/27/19. She is aware of the address.   Nothing further needed at time of call.

## 2019-08-27 ENCOUNTER — Other Ambulatory Visit: Payer: Self-pay

## 2019-08-27 ENCOUNTER — Ambulatory Visit: Payer: Medicare Other | Admitting: Pulmonary Disease

## 2019-08-27 ENCOUNTER — Encounter: Payer: Self-pay | Admitting: Family

## 2019-08-27 ENCOUNTER — Encounter: Payer: Self-pay | Admitting: Pulmonary Disease

## 2019-08-27 VITALS — BP 112/60 | HR 82 | Temp 97.7°F | Ht 70.0 in | Wt 173.8 lb

## 2019-08-27 DIAGNOSIS — R911 Solitary pulmonary nodule: Secondary | ICD-10-CM | POA: Diagnosis not present

## 2019-08-27 DIAGNOSIS — R0609 Other forms of dyspnea: Secondary | ICD-10-CM

## 2019-08-27 DIAGNOSIS — J449 Chronic obstructive pulmonary disease, unspecified: Secondary | ICD-10-CM | POA: Diagnosis not present

## 2019-08-27 DIAGNOSIS — R0600 Dyspnea, unspecified: Secondary | ICD-10-CM

## 2019-08-27 MED ORDER — STIOLTO RESPIMAT 2.5-2.5 MCG/ACT IN AERS: 2.0000 | INHALATION_SPRAY | Freq: Every day | RESPIRATORY_TRACT | 0 refills | Status: DC

## 2019-08-27 MED ORDER — STIOLTO RESPIMAT 2.5-2.5 MCG/ACT IN AERS: 2.0000 | INHALATION_SPRAY | Freq: Every day | RESPIRATORY_TRACT | 2 refills | Status: DC

## 2019-08-27 NOTE — Progress Notes (Signed)
Synopsis: Referred in October 2020 to establish care with new pulmonary provider former patient Dr. Melvyn Novas, PCP: Marrian Salvage,*  Subjective:   PATIENT ID: Candace Frey GENDER: female DOB: March 11, 1951, MRN: [id number]  Chief Complaint  Patient presents with  . Follow-up    Transferring care from Dr Melvyn Novas. She c/o worsening SOB over the past year. She gets winded walking stairs or any sort of incline. She admits to not using her spiriva inhaler daily and she rarely uses her albuterol inhaler.     68 yo FM, COPD, h/o VTE, enrolled in LDCT screening program, most recent in August 2020, Lung RADS 2B. Orginally from [place], moved here to help family. Son lives here in [place]. She is a retired Pharmacist, hospital. Husband was in broadcast.  Patient was originally seen in our clinic in 2018 by Dr. Shyrl Numbers.  At this time she has had progressive dyspnea on exertion.  She used to walk 5 to 6 miles a day.  At this point she is slowly come off of that.  She feels like over the past several months her dyspnea is worse but she also gets anxious when she is trying to exert herself.  She also has not been using Spiriva that she was prescribed back in 2018.  She does not have it at home but did not notice much difference after using it once or twice.  She does have an albuterol rescue inhaler that she also uses rarely.  Patient denies fevers, chills night sweats weight loss cough and no hemoptysis.  She does not have a daily sputum production.  Patient's father with a past medical history of lung cancer.   Past Medical History:  Diagnosis Date  . Acute deep vein thrombosis (DVT) of distal vein of right lower extremity ([place]) 07/22/2016  . Acute medial meniscal tear 01/29/2015  . Chicken pox   . Chronic obstructive lung disease ([place]) 05/23/2019  . Colon polyps   . Complication of anesthesia    slow to wake up   . COPD (chronic obstructive pulmonary disease) ([place])   . Genital warts   . Multiple pulmonary nodules  determined by computed tomography of lung 07/05/2017   CT 03/03/17  Right lower lobe nodularity described on the prior exam is less apparent today, favored to represent an area of scarring. other tiny nodules described on the prior are not readily apparent. No enlarging or dominant nodule identified > rec continue low dose annual  screening until 2023 per PCP (pt has fm hx lung ca)   . Shortness of breath dyspnea    with exertion was a smoker for many year per patient   . Thyroid disease   . UTI (urinary tract infection)      Family History  Problem Relation Age of Onset  . Dementia Mother   . Parkinson's disease Mother   . Lung cancer Father   . Prostate cancer Father   . CVA Father   . Colon cancer Neg Hx      Past Surgical History:  Procedure Laterality Date  . BREAST BIOPSY    . KNEE ARTHROSCOPY Right 01/29/2015   Procedure: RIGHT KNEE ARTHROSCOPY WITH DEBRIDEMENT  ;  Surgeon: Gaynelle Arabian, MD;  Location: [facility];  Service: Orthopedics;  Laterality: Right;  . THYROIDECTOMY, PARTIAL  1998    Social History   Socioeconomic History  . Marital status: Married    Spouse name: Not on file  . Number of children: 3  . Years of  education: Not on file  . Highest education level: Not on file  Occupational History  . Occupation: part-time  Social Needs  . Financial resource strain: Not hard at all  . Food insecurity    Worry: Never true    Inability: Never true  . Transportation needs    Medical: Yes    Non-medical: Yes  Tobacco Use  . Smoking status: Former Smoker    Packs/day: 2.00    Years: 43.00    Pack years: 86.00    Types: Cigarettes    Quit date: 11/22/2006    Years since quitting: 12.7  . Smokeless tobacco: Never Used  Substance and Sexual Activity  . Alcohol use: Yes    Alcohol/week: 7.0 standard drinks    Types: 7 Glasses of wine per week    Comment: 2 glasses of wine per night  . Drug use: No  . Sexual activity: Yes  Lifestyle  . Physical activity    Days  per week: 2 days    Minutes per session: 30 min  . Stress: Not at all  Relationships  . Social connections    Talks on phone: More than three times a week    Gets together: More than three times a week    Attends religious service: More than 4 times per year    Active member of club or organization: Yes    Attends meetings of clubs or organizations: More than 4 times per year    Relationship status: Married  . Intimate partner violence    Fear of current or ex partner: No    Emotionally abused: No    Physically abused: No    Forced sexual activity: No  Other Topics Concern  . Not on file  Social History Narrative  . Not on file     No Known Allergies   Outpatient Medications Prior to Visit  Medication Sig Dispense Refill  . acetaminophen (TYLENOL) 325 MG tablet Take 650 mg by mouth every 6 (six) hours as needed.    . Calcium Carbonate-Vitamin D (CALCIUM + D PO) Take 1 tablet by mouth daily.     . Estradiol 0.75 MG/1.25 GM (0.06%) topical gel Place 1.25 g onto the skin daily.    . Ibuprofen (ADVIL) 200 MG CAPS Take by mouth.    . levothyroxine (SYNTHROID) 100 MCG tablet Take 1 tablet (100 mcg total) by mouth daily before breakfast. 90 tablet 3  . Multiple Vitamin (MULTIVITAMIN) tablet Take 1 tablet by mouth daily.    . Multiple Vitamins-Minerals (PRESERVISION AREDS 2 PO) Take 1 tablet by mouth 2 (two) times daily.    Marland Kitchen PROAIR HFA 108 (90 Base) MCG/ACT inhaler     . progesterone (PROMETRIUM) 100 MG capsule progesterone micronized 100 mg capsule  Take 1 capsule every day by oral route.    . tiotropium (SPIRIVA HANDIHALER) 18 MCG inhalation capsule Place 1 capsule (18 mcg total) into inhaler and inhale daily. 30 capsule 1   No facility-administered medications prior to visit.     Review of Systems  Constitutional: Negative for chills, fever, malaise/fatigue and weight loss.  HENT: Negative for hearing loss, sore throat and tinnitus.   Eyes: Negative for blurred vision and  double vision.  Respiratory: Positive for shortness of breath. Negative for cough, hemoptysis, sputum production, wheezing and stridor.   Cardiovascular: Negative for chest pain, palpitations, orthopnea, leg swelling and PND.  Gastrointestinal: Negative for abdominal pain, constipation, diarrhea, heartburn, nausea and vomiting.  Genitourinary: Negative for  dysuria, hematuria and urgency.  Musculoskeletal: Negative for joint pain and myalgias.  Skin: Negative for itching and rash.  Neurological: Negative for dizziness, tingling, weakness and headaches.  Endo/Heme/Allergies: Negative for environmental allergies. Does not bruise/bleed easily.  Psychiatric/Behavioral: Negative for depression. The patient is not nervous/anxious and does not have insomnia.   All other systems reviewed and are negative.    Objective:  Physical Exam Vitals signs reviewed.  Constitutional:      General: She is not in acute distress.    Appearance: She is well-developed.  HENT:     Head: Normocephalic and atraumatic.  Eyes:     General: No scleral icterus.    Conjunctiva/sclera: Conjunctivae normal.     Pupils: Pupils are equal, round, and reactive to light.  Neck:     Musculoskeletal: Neck supple.     Vascular: No JVD.     Trachea: No tracheal deviation.  Cardiovascular:     Rate and Rhythm: Normal rate and regular rhythm.     Heart sounds: Normal heart sounds. No murmur.  Pulmonary:     Effort: Pulmonary effort is normal. No tachypnea, accessory muscle usage or respiratory distress.     Breath sounds: No stridor. No wheezing, rhonchi or rales.  Abdominal:     General: Bowel sounds are normal. There is no distension.     Palpations: Abdomen is soft.     Tenderness: There is no abdominal tenderness.  Musculoskeletal:        General: No tenderness.  Lymphadenopathy:     Cervical: No cervical adenopathy.  Skin:    General: Skin is warm and dry.     Capillary Refill: Capillary refill takes less than  2 seconds.     Findings: No rash.  Neurological:     Mental Status: She is alert and oriented to person, place, and time.  Psychiatric:        Behavior: Behavior normal.      Vitals:   08/27/19 1510  BP: 112/60  Pulse: 82  Temp: 97.7 F (36.5 C)  TempSrc: Oral  SpO2: 96%  Weight: 173 lb 12.8 oz (78.8 kg)  Height: 5\' 10"  (1.778 m)   96% on RA BMI Readings from Last 3 Encounters:  08/27/19 24.94 kg/m  08/15/19 25.11 kg/m  07/17/19 24.65 kg/m   Wt Readings from Last 3 Encounters:  08/27/19 173 lb 12.8 oz (78.8 kg)  08/15/19 175 lb (79.4 kg)  07/17/19 171 lb 12.8 oz (77.9 kg)    CBC    Component Value Date/Time   WBC 6.7 01/30/2017 2344   RBC 4.29 01/30/2017 2344   HGB 13.4 01/30/2017 2344   HCT 40.6 01/30/2017 2344   PLT 188 01/30/2017 2344   MCV 94.6 01/30/2017 2344   MCH 31.2 01/30/2017 2344   MCHC 33.0 01/30/2017 2344   RDW 13.5 01/30/2017 2344     Chest Imaging: Low-dose lung cancer screening CT from August 2020. Upper lobe 6.3 mm right apex lung nodule.  Pulmonary Functions Testing Results: PFT Results Latest Ref Rng & Units 07/05/2017  FVC-Pre L 3.42  FVC-Predicted Pre % 92  FVC-Post L 3.56  FVC-Predicted Post % 96  Pre FEV1/FVC % % 68  Post FEV1/FCV % % 68  FEV1-Pre L 2.33  FEV1-Predicted Pre % 82  FEV1-Post L 2.41    FeNO: None   Pathology: None   Echocardiogram: None (ordered not completed)   Heart Catheterization: None     Assessment & Plan:     ICD-10-CM  1. Stage 1 mild COPD by GOLD classification ([place])  J44.9   2. Nodule of upper lobe of right lung  R91.1   3. DOE (dyspnea on exertion)  R06.00     Discussion: 68 year old female longstanding history of smoking, mild COPD.  Currently enrolled in our lung cancer screening program.  Last lung cancer screening CT in August 2020 revealed a 6.3 mm right apex lung nodule.  She also has a COPD has not been using her Spiriva very often.  Predominantly due to not liking the dry  powder.  Plan: Would consider transitioning to Spiriva Respimat however with her ongoing dyspnea on exertion symptoms we will give her Stiolto to try first.  As we do not have any Spiriva Respimat samples at this time. Encouraged her to use a maintenance inhaler every day for the next 10 to 14 days to see how she does with her dyspnea. Encouraged her to get back to her physical activity with slow increments in her activity level. She also needs regular follow-up with her lung cancer screening program with plan to follow-up again in 1 year from August. We will see her back in 3 months after the changes of her new inhaler regimen make sure that she is doing okay from a symptom standpoint.  We also discussed her lung nodule risk today and potential for malignancy via the lung nodule risk calculator.  Greater than 50% of this patient's 25-minute office visit was spent face-to-face discussing the above recommendations and treatment plan.  Also an effort to establish care with new pulmonary provider.   Current Outpatient Medications:  .  acetaminophen (TYLENOL) 325 MG tablet, Take 650 mg by mouth every 6 (six) hours as needed., Disp: , Rfl:  .  Calcium Carbonate-Vitamin D (CALCIUM + D PO), Take 1 tablet by mouth daily. , Disp: , Rfl:  .  Estradiol 0.75 MG/1.25 GM (0.06%) topical gel, Place 1.25 g onto the skin daily., Disp: , Rfl:  .  Ibuprofen (ADVIL) 200 MG CAPS, Take by mouth., Disp: , Rfl:  .  levothyroxine (SYNTHROID) 100 MCG tablet, Take 1 tablet (100 mcg total) by mouth daily before breakfast., Disp: 90 tablet, Rfl: 3 .  Multiple Vitamin (MULTIVITAMIN) tablet, Take 1 tablet by mouth daily., Disp: , Rfl:  .  Multiple Vitamins-Minerals (PRESERVISION AREDS 2 PO), Take 1 tablet by mouth 2 (two) times daily., Disp: , Rfl:  .  PROAIR HFA 108 (90 Base) MCG/ACT inhaler, , Disp: , Rfl:  .  progesterone (PROMETRIUM) 100 MG capsule, progesterone micronized 100 mg capsule  Take 1 capsule every day by  oral route., Disp: , Rfl:  .  Tiotropium Bromide-Olodaterol (STIOLTO RESPIMAT) 2.5-2.5 MCG/ACT AERS, Inhale 2 puffs into the lungs daily., Disp: 8 g, Rfl: 0   Garner Nash, DO  Pulmonary Critical Care 08/27/2019 3:45 PM

## 2019-08-27 NOTE — Patient Instructions (Addendum)
Thank you for visiting Dr. Valeta Harms at [facility]. Today we recommend the following:  Stop spiriva  Start stiolto, 2 puffs once daily  Continue albuterol as needed  Follow up LDCT in one year from previous   Return in about 3 months (around 11/27/2019). to see Dr. Valeta Harms     Please do your part to reduce the spread of COVID-19.

## 2019-08-28 ENCOUNTER — Encounter: Payer: Self-pay | Admitting: Internal Medicine

## 2019-09-05 ENCOUNTER — Ambulatory Visit: Payer: Medicare Other | Admitting: Psychology

## 2019-09-08 ENCOUNTER — Other Ambulatory Visit: Payer: Self-pay

## 2019-09-08 ENCOUNTER — Ambulatory Visit (INDEPENDENT_AMBULATORY_CARE_PROVIDER_SITE_OTHER): Payer: Medicare Other

## 2019-09-08 DIAGNOSIS — Z23 Encounter for immunization: Secondary | ICD-10-CM

## 2019-09-12 ENCOUNTER — Telehealth (HOSPITAL_COMMUNITY): Payer: Self-pay | Admitting: *Deleted

## 2019-09-12 ENCOUNTER — Encounter (HOSPITAL_COMMUNITY): Payer: Self-pay | Admitting: *Deleted

## 2019-09-12 NOTE — Telephone Encounter (Signed)
Left message on voicemail per DPR in reference to upcoming appointment scheduled on 09/19/2019 at 0715 with detailed instructions given per Myocardial Perfusion Study Information Sheet for the test. LM to arrive 15 minutes early, and that it is imperative to arrive on time for appointment to keep from having the test rescheduled. If you need to cancel or reschedule your appointment, please call the office within 24 hours of your appointment. Failure to do so may result in a cancellation of your appointment, and a $50 no show fee. Phone number given for call back for any questions.  My chart letter sent.Vikram Tillett, Ranae Palms

## 2019-09-17 ENCOUNTER — Ambulatory Visit (INDEPENDENT_AMBULATORY_CARE_PROVIDER_SITE_OTHER): Payer: Medicare Other | Admitting: Psychology

## 2019-09-17 DIAGNOSIS — F411 Generalized anxiety disorder: Secondary | ICD-10-CM

## 2019-09-19 ENCOUNTER — Encounter (HOSPITAL_COMMUNITY): Payer: Medicare Other

## 2019-09-19 ENCOUNTER — Other Ambulatory Visit (HOSPITAL_COMMUNITY): Payer: Medicare Other

## 2019-09-26 ENCOUNTER — Ambulatory Visit (INDEPENDENT_AMBULATORY_CARE_PROVIDER_SITE_OTHER): Payer: Medicare Other | Admitting: Psychology

## 2019-09-26 DIAGNOSIS — F411 Generalized anxiety disorder: Secondary | ICD-10-CM | POA: Diagnosis not present

## 2019-10-01 ENCOUNTER — Inpatient Hospital Stay: Admission: RE | Admit: 2019-10-01 | Payer: Medicare Other | Source: Ambulatory Visit

## 2019-10-26 ENCOUNTER — Ambulatory Visit (INDEPENDENT_AMBULATORY_CARE_PROVIDER_SITE_OTHER): Payer: Medicare Other | Admitting: Psychology

## 2019-10-26 DIAGNOSIS — F411 Generalized anxiety disorder: Secondary | ICD-10-CM

## 2019-11-07 ENCOUNTER — Ambulatory Visit (INDEPENDENT_AMBULATORY_CARE_PROVIDER_SITE_OTHER): Payer: Medicare Other | Admitting: Psychology

## 2019-11-07 DIAGNOSIS — F411 Generalized anxiety disorder: Secondary | ICD-10-CM | POA: Diagnosis not present

## 2019-12-07 ENCOUNTER — Encounter: Payer: Self-pay | Admitting: Family

## 2019-12-20 ENCOUNTER — Ambulatory Visit: Payer: Medicare Other | Attending: Internal Medicine

## 2019-12-20 DIAGNOSIS — Z20822 Contact with and (suspected) exposure to covid-19: Secondary | ICD-10-CM

## 2019-12-21 LAB — NOVEL CORONAVIRUS, NAA: SARS-CoV-2, NAA: NOT DETECTED

## 2019-12-31 ENCOUNTER — Ambulatory Visit: Payer: Medicare Other

## 2020-01-14 ENCOUNTER — Encounter: Payer: Self-pay | Admitting: Internal Medicine

## 2020-02-13 ENCOUNTER — Ambulatory Visit: Payer: Medicare Other | Admitting: Pulmonary Disease

## 2020-02-13 ENCOUNTER — Other Ambulatory Visit: Payer: Self-pay

## 2020-02-13 ENCOUNTER — Encounter: Payer: Self-pay | Admitting: Pulmonary Disease

## 2020-02-13 VITALS — BP 118/68 | HR 79 | Ht 69.0 in | Wt 172.2 lb

## 2020-02-13 DIAGNOSIS — R911 Solitary pulmonary nodule: Secondary | ICD-10-CM

## 2020-02-13 DIAGNOSIS — Z87891 Personal history of nicotine dependence: Secondary | ICD-10-CM | POA: Diagnosis not present

## 2020-02-13 DIAGNOSIS — J449 Chronic obstructive pulmonary disease, unspecified: Secondary | ICD-10-CM

## 2020-02-13 MED ORDER — TRELEGY ELLIPTA 100-62.5-25 MCG/INH IN AEPB: 1.0000 | INHALATION_SPRAY | Freq: Every day | RESPIRATORY_TRACT | 5 refills | Status: DC

## 2020-02-13 MED ORDER — TRELEGY ELLIPTA 100-62.5-25 MCG/INH IN AEPB: 1.0000 | INHALATION_SPRAY | Freq: Every day | RESPIRATORY_TRACT | 0 refills | Status: DC

## 2020-02-13 NOTE — Progress Notes (Signed)
Patient seen in the office today and instructed on use of Trelegy 100.  Patient expressed understanding and demonstrated technique.  

## 2020-02-13 NOTE — Patient Instructions (Signed)
Thank you for visiting Dr. Valeta Harms at [facility]. Today we recommend the following:  FULL PFTs prior to next visit  Trelegy inhaler samples   Return in about 8 weeks (around 04/09/2020).    Please do your part to reduce the spread of COVID-19.

## 2020-02-13 NOTE — Addendum Note (Signed)
Addended by: Lorretta Harp on: 02/13/2020 10:29 AM   Modules accepted: Orders

## 2020-02-13 NOTE — Progress Notes (Signed)
Synopsis: Referred in October 2020 to establish care with new pulmonary provider former patient Dr. Melvyn Novas, PCP: Marrian Salvage,*  Subjective:   PATIENT ID: Candace Frey GENDER: female DOB: 14-Sep-1951, MRN: [id number]  Chief Complaint  Patient presents with  . Follow-up    Pt states she has been doing okay since last visit. Pt states her breathing has become worse since last visit. Pt denies any complaints of cough.    69 yo FM, COPD, h/o VTE, enrolled in LDCT screening program, most recent in August 2020, Lung RADS 2B. Orginally from [place], moved here to help family. Son lives here in [place]. She is a retired Pharmacist, hospital. Husband was in broadcast.  Patient was originally seen in our clinic in 2018 by Dr. Melvyn Novas.  At this time she has had progressive dyspnea on exertion.  She used to walk 5 to 6 miles a day.  At this point she is slowly come off of that.  She feels like over the past several months her dyspnea is worse but she also gets anxious when she is trying to exert herself.  She also has not been using Spiriva that she was prescribed back in 2018.  She does not have it at home but did not notice much difference after using it once or twice.  She does have an albuterol rescue inhaler that she also uses rarely.  Patient denies fevers, chills night sweats weight loss cough and no hemoptysis.  She does not have a daily sputum production.  Patient's father with a past medical history of lung cancer.  OV 02/13/2020: Patient here today for follow-up regarding COPD.  Since last office visit she has completed her 2 series of COVID-19 vaccinations.  Today she again however does have some dyspnea on exertion.  She does state that some of her symptoms have slowly been progressively getting worse over the past couple of months.  She was relatively inactive during Covid lockdown timeframes but now has become more active.  She does feel that her symptomatology has been changing.  We did review her  pulmonary function test that she had in 2018.  She also has questions today regarding her lung nodule that was found on recent screening.  CT scan follow-up planned for August 2021.   Past Medical History:  Diagnosis Date  . Acute deep vein thrombosis (DVT) of distal vein of right lower extremity ([place]) 07/22/2016  . Acute medial meniscal tear 01/29/2015  . Chicken pox   . Chronic obstructive lung disease ([place]) 05/23/2019  . Colon polyps   . Complication of anesthesia    slow to wake up   . COPD (chronic obstructive pulmonary disease) ([place])   . Genital warts   . Multiple pulmonary nodules determined by computed tomography of lung 07/05/2017   CT 03/03/17  Right lower lobe nodularity described on the prior exam is less apparent today, favored to represent an area of scarring. other tiny nodules described on the prior are not readily apparent. No enlarging or dominant nodule identified > rec continue low dose annual  screening until 2023 per PCP (pt has fm hx lung ca)   . Shortness of breath dyspnea    with exertion was a smoker for many year per patient   . Thyroid disease   . UTI (urinary tract infection)      Family History  Problem Relation Age of Onset  . Dementia Mother   . Parkinson's disease Mother   . Lung cancer Father   .  Prostate cancer Father   . CVA Father   . Colon cancer Neg Hx      Past Surgical History:  Procedure Laterality Date  . BREAST BIOPSY    . KNEE ARTHROSCOPY Right 01/29/2015   Procedure: RIGHT KNEE ARTHROSCOPY WITH DEBRIDEMENT  ;  Surgeon: Gaynelle Arabian, MD;  Location: [facility];  Service: Orthopedics;  Laterality: Right;  . THYROIDECTOMY, PARTIAL  1998    Social History   Socioeconomic History  . Marital status: Married    Spouse name: Not on file  . Number of children: 3  . Years of education: Not on file  . Highest education level: Not on file  Occupational History  . Occupation: part-time  Tobacco Use  . Smoking status: Former Smoker    Packs/day:  2.00    Years: 43.00    Pack years: 86.00    Types: Cigarettes    Quit date: 11/22/2006    Years since quitting: 13.2  . Smokeless tobacco: Never Used  Substance and Sexual Activity  . Alcohol use: Yes    Alcohol/week: 7.0 standard drinks    Types: 7 Glasses of wine per week    Comment: 2 glasses of wine per night  . Drug use: No  . Sexual activity: Yes  Other Topics Concern  . Not on file  Social History Narrative  . Not on file   Social Determinants of Health   Financial Resource Strain: Low Risk   . Difficulty of Paying Living Expenses: Not hard at all  Food Insecurity: No Food Insecurity  . Worried About Charity fundraiser in the Last Year: Never true  . Ran Out of Food in the Last Year: Never true  Transportation Needs: Unmet Transportation Needs  . Lack of Transportation (Medical): Yes  . Lack of Transportation (Non-Medical): Yes  Physical Activity: Insufficiently Active  . Days of Exercise per Week: 2 days  . Minutes of Exercise per Session: 30 min  Stress: No Stress Concern Present  . Feeling of Stress : Not at all  Social Connections: Not Isolated  . Frequency of Communication with Friends and Family: More than three times a week  . Frequency of Social Gatherings with Friends and Family: More than three times a week  . Attends Religious Services: More than 4 times per year  . Active Member of Clubs or Organizations: Yes  . Attends Archivist Meetings: More than 4 times per year  . Marital Status: Married  Human resources officer Violence: Not At Risk  . Fear of Current or Ex-Partner: No  . Emotionally Abused: No  . Physically Abused: No  . Sexually Abused: No     No Known Allergies   Outpatient Medications Prior to Visit  Medication Sig Dispense Refill  . acetaminophen (TYLENOL) 325 MG tablet Take 650 mg by mouth every 6 (six) hours as needed.    . Calcium Carbonate-Vitamin D (CALCIUM + D PO) Take 1 tablet by mouth daily.     . Estradiol 0.75 MG/1.25  GM (0.06%) topical gel Place 1.25 g onto the skin daily.    . Ibuprofen (ADVIL) 200 MG CAPS Take by mouth.    . levothyroxine (SYNTHROID) 100 MCG tablet Take 1 tablet (100 mcg total) by mouth daily before breakfast. 90 tablet 3  . Multiple Vitamin (MULTIVITAMIN) tablet Take 1 tablet by mouth daily.    . Multiple Vitamins-Minerals (PRESERVISION AREDS 2 PO) Take 1 tablet by mouth 2 (two) times daily.    Marland Kitchen PROAIR  HFA 108 (90 Base) MCG/ACT inhaler     . progesterone (PROMETRIUM) 100 MG capsule progesterone micronized 100 mg capsule  Take 1 capsule every day by oral route.    . Tiotropium Bromide-Olodaterol (STIOLTO RESPIMAT) 2.5-2.5 MCG/ACT AERS Inhale 2 puffs into the lungs daily. 4 g 2   No facility-administered medications prior to visit.    Review of Systems  Constitutional: Negative for chills, fever, malaise/fatigue and weight loss.  HENT: Negative for hearing loss, sore throat and tinnitus.   Eyes: Negative for blurred vision and double vision.  Respiratory: Positive for shortness of breath. Negative for cough, hemoptysis, sputum production, wheezing and stridor.   Cardiovascular: Negative for chest pain, palpitations, orthopnea, leg swelling and PND.  Gastrointestinal: Negative for abdominal pain, constipation, diarrhea, heartburn, nausea and vomiting.  Genitourinary: Negative for dysuria, hematuria and urgency.  Musculoskeletal: Negative for joint pain and myalgias.  Skin: Negative for itching and rash.  Neurological: Negative for dizziness, tingling, weakness and headaches.  Endo/Heme/Allergies: Negative for environmental allergies. Does not bruise/bleed easily.  Psychiatric/Behavioral: Negative for depression. The patient is not nervous/anxious and does not have insomnia.   All other systems reviewed and are negative.    Objective:  Physical Exam Vitals reviewed.  Constitutional:      General: She is not in acute distress.    Appearance: She is well-developed.  HENT:      Head: Normocephalic and atraumatic.  Eyes:     General: No scleral icterus.    Conjunctiva/sclera: Conjunctivae normal.     Pupils: Pupils are equal, round, and reactive to light.  Neck:     Vascular: No JVD.     Trachea: No tracheal deviation.  Cardiovascular:     Rate and Rhythm: Normal rate and regular rhythm.     Heart sounds: Normal heart sounds. No murmur.  Pulmonary:     Effort: Pulmonary effort is normal. No tachypnea, accessory muscle usage or respiratory distress.     Breath sounds: Normal breath sounds. No stridor. No wheezing, rhonchi or rales.  Abdominal:     General: Bowel sounds are normal. There is no distension.     Palpations: Abdomen is soft.     Tenderness: There is no abdominal tenderness.  Musculoskeletal:        General: No tenderness.     Cervical back: Neck supple.  Lymphadenopathy:     Cervical: No cervical adenopathy.  Skin:    General: Skin is warm and dry.     Capillary Refill: Capillary refill takes less than 2 seconds.     Findings: No rash.  Neurological:     Mental Status: She is alert and oriented to person, place, and time.  Psychiatric:        Behavior: Behavior normal.      Vitals:   02/13/20 0934  BP: 118/68  Pulse: 79  SpO2: 99%  Weight: 172 lb 3.2 oz (78.1 kg)  Height: 5\' 9"  (1.753 m)   99% on RA BMI Readings from Last 3 Encounters:  02/13/20 25.43 kg/m  08/27/19 24.94 kg/m  08/15/19 25.11 kg/m   Wt Readings from Last 3 Encounters:  02/13/20 172 lb 3.2 oz (78.1 kg)  08/27/19 173 lb 12.8 oz (78.8 kg)  08/15/19 175 lb (79.4 kg)    CBC    Component Value Date/Time   WBC 6.7 01/30/2017 2344   RBC 4.29 01/30/2017 2344   HGB 13.4 01/30/2017 2344   HCT 40.6 01/30/2017 2344   PLT 188 01/30/2017 2344  MCV 94.6 01/30/2017 2344   MCH 31.2 01/30/2017 2344   MCHC 33.0 01/30/2017 2344   RDW 13.5 01/30/2017 2344     Chest Imaging: Low-dose lung cancer screening CT from August 2020. Upper lobe 6.3 mm right apex lung  nodule.  Pulmonary Functions Testing Results: PFT Results Latest Ref Rng & Units 07/05/2017  FVC-Pre L 3.42  FVC-Predicted Pre % 92  FVC-Post L 3.56  FVC-Predicted Post % 96  Pre FEV1/FVC % % 68  Post FEV1/FCV % % 68  FEV1-Pre L 2.33  FEV1-Predicted Pre % 82  FEV1-Post L 2.41    FeNO: None   Pathology: None   Echocardiogram: None (ordered not completed)   Heart Catheterization: None     Assessment & Plan:     ICD-10-CM   1. Stage 1 mild COPD by GOLD classification (HCC)  J44.9   2. Nodule of upper lobe of right lung  R91.1   3. Former smoker  Z87.891     Discussion: 69 year old female longstanding history of smoking, mild PD on last pulmonary function test in 2018.  Last lung cancer screening with a 6.3 mm right upper lobe nodule.  Was on Stiolto last office visit.  Has been using this since.  She has seen a ongoing decline in symptomatology.  Plan: Stop Stiolto Continue albuterol shortness of breath and wheezing Start Trelegy 100 Follow-up in 8 weeks with PFTs to be completed prior to next office visit.  Okay to send prescription for Trelegy.  Patient has repeat lung cancer screening CT and August 2021.  We will also see her following this.  Return to clinic in 8 weeks to make sure her symptomatology with new inhaler regimen is stable.  Greater than 50% of this patient's visit face-to-face discussing the above recommendations and treatment plan.    Current Outpatient Medications:  .  acetaminophen (TYLENOL) 325 MG tablet, Take 650 mg by mouth every 6 (six) hours as needed., Disp: , Rfl:  .  Calcium Carbonate-Vitamin D (CALCIUM + D PO), Take 1 tablet by mouth daily. , Disp: , Rfl:  .  Estradiol 0.75 MG/1.25 GM (0.06%) topical gel, Place 1.25 g onto the skin daily., Disp: , Rfl:  .  Ibuprofen (ADVIL) 200 MG CAPS, Take by mouth., Disp: , Rfl:  .  levothyroxine (SYNTHROID) 100 MCG tablet, Take 1 tablet (100 mcg total) by mouth daily before breakfast., Disp: 90  tablet, Rfl: 3 .  Multiple Vitamin (MULTIVITAMIN) tablet, Take 1 tablet by mouth daily., Disp: , Rfl:  .  Multiple Vitamins-Minerals (PRESERVISION AREDS 2 PO), Take 1 tablet by mouth 2 (two) times daily., Disp: , Rfl:  .  PROAIR HFA 108 (90 Base) MCG/ACT inhaler, , Disp: , Rfl:  .  progesterone (PROMETRIUM) 100 MG capsule, progesterone micronized 100 mg capsule  Take 1 capsule every day by oral route., Disp: , Rfl:  .  Tiotropium Bromide-Olodaterol (STIOLTO RESPIMAT) 2.5-2.5 MCG/ACT AERS, Inhale 2 puffs into the lungs daily., Disp: 4 g, Rfl: 2   Garner Nash, DO [facility] 02/13/2020 9:58 AM

## 2020-02-25 ENCOUNTER — Telehealth: Payer: Self-pay | Admitting: Internal Medicine

## 2020-02-25 NOTE — Telephone Encounter (Signed)
I can see in the order :  Modified By (on 07/20/2019 0841) Ortencia Kick [phone] -- [email or web address]  But it does not seem that the pt was ever scheduled. I can reach out to imaging and see why. Please advise on blood work.

## 2020-02-25 NOTE — Telephone Encounter (Signed)
Pt given # to imaging to schedule ultrasound also informed that once that is performed and results are in that someone from our office will reach out to her.

## 2020-02-25 NOTE — Telephone Encounter (Signed)
Patient called stating she never heard from anyone about getting an ultrasound, she also states shes having issues with her thyroid and would like to have lab work done to see if she needs to change any medication she is on. Ph# [phone]

## 2020-02-29 ENCOUNTER — Ambulatory Visit
Admission: RE | Admit: 2020-02-29 | Discharge: 2020-02-29 | Disposition: A | Discharge status: Home / Self Care | Payer: Medicare Other | Source: Ambulatory Visit | Attending: Internal Medicine | Admitting: Internal Medicine

## 2020-02-29 ENCOUNTER — Other Ambulatory Visit: Payer: Self-pay | Admitting: *Deleted

## 2020-02-29 ENCOUNTER — Other Ambulatory Visit (HOSPITAL_COMMUNITY)
Admission: RE | Admit: 2020-02-29 | Discharge: 2020-02-29 | Disposition: A | Discharge status: Home / Self Care | Payer: Medicare Other | Source: Ambulatory Visit | Attending: Pulmonary Disease | Admitting: Pulmonary Disease

## 2020-02-29 ENCOUNTER — Other Ambulatory Visit: Payer: Self-pay

## 2020-02-29 DIAGNOSIS — Z20822 Contact with and (suspected) exposure to covid-19: Secondary | ICD-10-CM | POA: Insufficient documentation

## 2020-02-29 DIAGNOSIS — J449 Chronic obstructive pulmonary disease, unspecified: Secondary | ICD-10-CM

## 2020-02-29 DIAGNOSIS — Z01812 Encounter for preprocedural laboratory examination: Secondary | ICD-10-CM | POA: Insufficient documentation

## 2020-02-29 DIAGNOSIS — E042 Nontoxic multinodular goiter: Secondary | ICD-10-CM

## 2020-02-29 LAB — SARS CORONAVIRUS 2 (TAT 6-24 HRS): SARS Coronavirus 2: NEGATIVE

## 2020-03-03 ENCOUNTER — Ambulatory Visit (INDEPENDENT_AMBULATORY_CARE_PROVIDER_SITE_OTHER): Payer: Medicare Other | Admitting: Pulmonary Disease

## 2020-03-03 ENCOUNTER — Other Ambulatory Visit: Payer: Self-pay

## 2020-03-03 DIAGNOSIS — J449 Chronic obstructive pulmonary disease, unspecified: Secondary | ICD-10-CM | POA: Diagnosis not present

## 2020-03-03 LAB — PULMONARY FUNCTION TEST
DL/VA % pred: 68 %
DL/VA: 2.74 ml/min/mmHg/L
DLCO cor % pred: 65 %
DLCO cor: 14.91 ml/min/mmHg
DLCO unc % pred: 65 %
DLCO unc: 14.91 ml/min/mmHg
FEF 25-75 Post: 1.48 L/sec
FEF 25-75 Pre: 1.32 L/sec
FEF2575-%Change-Post: 12 %
FEF2575-%Pred-Post: 67 %
FEF2575-%Pred-Pre: 59 %
FEV1-%Change-Post: 3 %
FEV1-%Pred-Post: 87 %
FEV1-%Pred-Pre: 84 %
FEV1-Post: 2.41 L
FEV1-Pre: 2.34 L
FEV1FVC-%Change-Post: 2 %
FEV1FVC-%Pred-Pre: 90 %
FEV6-%Change-Post: 3 %
FEV6-%Pred-Post: 97 %
FEV6-%Pred-Pre: 94 %
FEV6-Post: 3.39 L
FEV6-Pre: 3.28 L
FEV6FVC-%Change-Post: 1 %
FEV6FVC-%Pred-Post: 103 %
FEV6FVC-%Pred-Pre: 101 %
FVC-%Change-Post: 0 %
FVC-%Pred-Post: 94 %
FVC-%Pred-Pre: 93 %
FVC-Post: 3.43 L
FVC-Pre: 3.41 L
Post FEV1/FVC ratio: 70 %
Post FEV6/FVC ratio: 99 %
Pre FEV1/FVC ratio: 69 %
Pre FEV6/FVC Ratio: 98 %
RV % pred: 85 %
RV: 2.09 L
TLC % pred: 96 %
TLC: 5.61 L

## 2020-03-03 NOTE — Progress Notes (Signed)
PFT done today. 

## 2020-03-05 ENCOUNTER — Other Ambulatory Visit: Payer: Self-pay

## 2020-03-05 ENCOUNTER — Ambulatory Visit: Payer: Medicare Other | Admitting: *Deleted

## 2020-03-05 ENCOUNTER — Telehealth: Payer: Self-pay | Admitting: *Deleted

## 2020-03-05 VITALS — Temp 96.2°F | Ht 69.0 in | Wt 171.4 lb

## 2020-03-05 DIAGNOSIS — Z86010 Personal history of colonic polyps: Secondary | ICD-10-CM

## 2020-03-05 MED ORDER — SUTAB 1479-225-188 MG PO TABS: 1.0000 | ORAL_TABLET | Freq: Once | ORAL | 0 refills | Status: AC

## 2020-03-05 NOTE — Telephone Encounter (Signed)
Dr. Hilarie Fredrickson,  This pt was seen in [place] today for a recall colonoscopy on 03-19-20.  She has hx of COPD and was scheduled for an ECHO last September 2020.  She had been having increasing SOB and her doctor wanted to rule out if cardiac or respiratory related.  She did not get the ECHO done at that time- she wanted to do the treadmill test but they were not doing that type d/t covid.  She states she is going to get the ECHO rescheduled.  Are you ok proceeding with scheduled colonoscopy or wait until ECHO done?  Please advise,  Thanks, Cyril Mourning

## 2020-03-05 NOTE — Telephone Encounter (Signed)
Kristen thank you I spoke to Mrs. Nocerino by phone and I feel that it is best if she have her cardiac evaluation and echocardiogram prior to proceeding with surveillance colonoscopy.  She understands this logic and is in agreement.  Her husband, Isabelita Bottari is scheduled for a colonoscopy with me on 04/15/2020 and he can move up and take her slot on 03/19/2020.  We can then put her colonoscopy tentatively in his slot on 04/15/2020 assuming cardiac evaluation is performed before that time.  Please make the schedule swaps as above and get Mr. Meineke in for previsit so that he can proceed with his surveillance colonoscopy on 03/19/2020.  Thanks [employer]

## 2020-03-05 NOTE — Progress Notes (Signed)
Pt states she is "hard to wake up".  No intubation problems or hx/fam hx of malignant hyperthermia  See TE from today to Dr. Norman Herrlich  2nd dose of vaccine received greater than 2 weeks ago   No egg or soy allergy  No home oxygen use   No medications for weight loss taken  emmi information given  Pt denies constipation issues  Pt is aware that care partner will wait in the car during procedure; if they feel like they will be too hot or cold to wait in the car; they may wait in the 4 th floor lobby. Patient is aware to bring only one care partner. We want them to wear a mask (we do not have any that we can provide them), practice social distancing, and we will check their temperatures when they get here.  I did remind the patient that their care partner needs to stay in the parking lot the entire time and have a cell phone available, we will call them when the pt is ready for discharge. Patient will wear mask into building.   Sutab code put into RX and paper copy given to pt to show pharmacy

## 2020-03-06 NOTE — Telephone Encounter (Signed)
Continued from previous note- I will do Candace Frey's PV today virtually

## 2020-03-06 NOTE — Telephone Encounter (Signed)
Spoke with pt and told her that I will mail her new instructions.  Appointments for her and her husband changed per DO.  I will

## 2020-03-07 ENCOUNTER — Telehealth: Payer: Self-pay | Admitting: Cardiology

## 2020-03-07 DIAGNOSIS — R0602 Shortness of breath: Secondary | ICD-10-CM

## 2020-03-07 NOTE — Telephone Encounter (Signed)
Left message for patient to call back  

## 2020-03-07 NOTE — Telephone Encounter (Signed)
OK to do stress myoview but will still have to have nuclear tracer for the myoview portion

## 2020-03-07 NOTE — Telephone Encounter (Signed)
Patient is calling in regards to stress test order created on 08/15/19. She is now ready to schedule stress test. She states she prefers to have treadmill test and would not like to receive the injection. Please return call to discuss.

## 2020-03-10 ENCOUNTER — Other Ambulatory Visit: Payer: Self-pay

## 2020-03-10 ENCOUNTER — Ambulatory Visit (HOSPITAL_COMMUNITY): Payer: Medicare Other | Attending: Cardiovascular Disease

## 2020-03-10 ENCOUNTER — Ambulatory Visit (INDEPENDENT_AMBULATORY_CARE_PROVIDER_SITE_OTHER)
Admission: RE | Admit: 2020-03-10 | Discharge: 2020-03-10 | Disposition: A | Discharge status: Home / Self Care | Payer: Self-pay | Source: Ambulatory Visit | Attending: Cardiology | Admitting: Cardiology

## 2020-03-10 DIAGNOSIS — R0602 Shortness of breath: Secondary | ICD-10-CM

## 2020-03-19 ENCOUNTER — Encounter: Payer: Medicare Other | Admitting: Internal Medicine

## 2020-03-19 ENCOUNTER — Encounter (HOSPITAL_COMMUNITY): Payer: Self-pay | Admitting: Cardiology

## 2020-03-31 ENCOUNTER — Telehealth (HOSPITAL_COMMUNITY): Payer: Self-pay | Admitting: Cardiology

## 2020-03-31 NOTE — Telephone Encounter (Signed)
Just an FYI. We have made several attempts to contact this patient including sending a letter to schedule or reschedule their Myocardial Perfusion. We will be removing the patient from the W[facility].   Mailed letter 03/19/2020  03/19/2020 [facility] to schedule @ 9:14/[facility]  03/17/20 [facility] to schedule @ 11:28/[facility]  03/11/20 [facility] to schedule @ 3:05/[facility]  If patient decides to have we can reinstate order.       Thank you

## 2020-04-15 ENCOUNTER — Encounter: Payer: Medicare Other | Admitting: Internal Medicine

## 2020-05-01 ENCOUNTER — Encounter: Payer: Self-pay | Admitting: Internal Medicine

## 2020-05-12 ENCOUNTER — Telehealth: Payer: Self-pay | Admitting: Internal Medicine

## 2020-05-12 DIAGNOSIS — Z86010 Personal history of colonic polyps: Secondary | ICD-10-CM

## 2020-05-12 MED ORDER — SUTAB 1479-225-188 MG PO TABS: 24.0000 | ORAL_TABLET | ORAL | 0 refills | Status: DC

## 2020-05-12 MED ORDER — SUPREP BOWEL PREP KIT 17.5-3.13-1.6 GM/177ML PO SOLN: 1.0000 | Freq: Once | ORAL | 0 refills | Status: AC

## 2020-05-12 NOTE — Telephone Encounter (Signed)
Pt is requesting prep for colon (sutab). Please send to [facility] on [address].  Procedure is scheduled 6/22

## 2020-05-12 NOTE — Telephone Encounter (Signed)
[facility] script with coupon code sent ot [facility] at pt's request

## 2020-05-12 NOTE — Addendum Note (Signed)
Addended by: Steva Ready on: 05/12/2020 04:32 PM   Modules accepted: Orders

## 2020-05-12 NOTE — Telephone Encounter (Signed)
Sent in [place] to [facility] [place]- e mailed suprep instructions to pt at [email or web address] at pt's request as procedure is tomorrow-  went over suprep with pt over the phone-- instructed pt to call

## 2020-05-12 NOTE — Telephone Encounter (Signed)
Patient calling again to get liquid prep instead for [facility] not have it only [facility]

## 2020-05-13 ENCOUNTER — Encounter: Payer: Self-pay | Admitting: Internal Medicine

## 2020-05-13 ENCOUNTER — Other Ambulatory Visit: Payer: Self-pay

## 2020-05-13 ENCOUNTER — Ambulatory Visit (AMBULATORY_SURGERY_CENTER): Payer: Medicare Other | Admitting: Internal Medicine

## 2020-05-13 VITALS — BP 121/59 | HR 56 | Temp 96.6°F | Resp 11 | Ht 69.0 in | Wt 171.4 lb

## 2020-05-13 DIAGNOSIS — Z86010 Personal history of colonic polyps: Secondary | ICD-10-CM | POA: Diagnosis not present

## 2020-05-13 DIAGNOSIS — D124 Benign neoplasm of descending colon: Secondary | ICD-10-CM

## 2020-05-13 MED ORDER — SODIUM CHLORIDE 0.9 % IV SOLN: 500.0000 mL | Freq: Once | INTRAVENOUS | Status: DC

## 2020-05-13 NOTE — Progress Notes (Signed)
VS-CW  Pt's states no medical or surgical changes since previsit or office visit.  

## 2020-05-13 NOTE — Op Note (Signed)
[facility] Patient Name: Candace Frey Procedure Date: 05/13/2020 11:15 AM MRN: [id number] Endoscopist: Jerene Bears , MD Age: 69 Referring MD:  Date of Birth: 05-Aug-1951 Gender: Female Account #: [id number] Procedure:                Colonoscopy Indications:              High risk colon cancer surveillance: Personal                            history of non-advanced adenoma, Last colonoscopy:                            October 2015 Medicines:                Monitored Anesthesia Care Procedure:                Pre-Anesthesia Assessment:                           - Prior to the procedure, a History and Physical                            was performed, and patient medications and                            allergies were reviewed. The patient's tolerance of                            previous anesthesia was also reviewed. The risks                            and benefits of the procedure and the sedation                            options and risks were discussed with the patient.                            All questions were answered, and informed consent                            was obtained. Prior Anticoagulants: The patient has                            taken no previous anticoagulant or antiplatelet                            agents. ASA Grade Assessment: II - A patient with                            mild systemic disease. After reviewing the risks                            and benefits, the patient was deemed in  satisfactory condition to undergo the procedure.                           After obtaining informed consent, the colonoscope                            was passed under direct vision. Throughout the                            procedure, the patient's blood pressure, pulse, and                            oxygen saturations were monitored continuously. The                            Colonoscope was introduced through the anus  and                            advanced to the cecum, identified by appendiceal                            orifice and ileocecal valve. The colonoscopy was                            performed without difficulty. The patient tolerated                            the procedure well. The quality of the bowel                            preparation was good. The ileocecal valve,                            appendiceal orifice, and rectum were photographed. Scope In: 11:28:48 AM Scope Out: 11:46:36 AM Scope Withdrawal Time: 0 hours 10 minutes 57 seconds  Total Procedure Duration: 0 hours 17 minutes 48 seconds  Findings:                 The digital rectal exam was normal.                           Two sessile polyps were found in the descending                            colon. The polyps were 5 to 6 mm in size. These                            polyps were removed with a cold snare. Resection                            and retrieval were complete.                           A few small-mouthed diverticula were found in the  sigmoid colon.                           The exam was otherwise without abnormality on                            direct and retroflexion views. Complications:            No immediate complications. Estimated Blood Loss:     Estimated blood loss was minimal. Impression:               - Two 5 to 6 mm polyps in the descending colon,                            removed with a cold snare. Resected and retrieved.                           - Diverticulosis in the sigmoid colon.                           - The examination was otherwise normal on direct                            and retroflexion views. Recommendation:           - Patient has a contact number available for                            emergencies. The signs and symptoms of potential                            delayed complications were discussed with the                            patient.  Return to normal activities tomorrow.                            Written discharge instructions were provided to the                            patient.                           - Resume previous diet.                           - Continue present medications.                           - Await pathology results.                           - Repeat colonoscopy is recommended for                            surveillance. The colonoscopy date will be  determined after pathology results from today's                            exam become available for review. Jerene Bears, MD 05/13/2020 11:48:52 AM This report has been signed electronically.

## 2020-05-13 NOTE — Progress Notes (Signed)
Called to room to assist during endoscopic procedure.  Patient ID and intended procedure confirmed with present staff. Received instructions for my participation in the procedure from the performing physician.  

## 2020-05-13 NOTE — Patient Instructions (Signed)
Read all of the handouts given to you by your recovery room nurse.  Thank-you for choosing us for your healthcare needs today.  YOU HAD AN ENDOSCOPIC PROCEDURE TODAY AT THE [facility]:   Refer to the procedure report that was given to you for any specific questions about what was found during the examination.  If the procedure report does not answer your questions, please call your gastroenterologist to clarify.  If you requested that your care partner not be given the details of your procedure findings, then the procedure report has been included in a sealed envelope for you to review at your convenience later.  YOU SHOULD EXPECT: Some feelings of bloating in the abdomen. Passage of more gas than usual.  Walking can help get rid of the air that was put into your GI tract during the procedure and reduce the bloating. If you had a lower endoscopy (such as a colonoscopy or flexible sigmoidoscopy) you may notice spotting of blood in your stool or on the toilet paper. If you underwent a bowel prep for your procedure, you may not have a normal bowel movement for a few days.  Please Note:  You might notice some irritation and congestion in your nose or some drainage.  This is from the oxygen used during your procedure.  There is no need for concern and it should clear up in a day or so.  SYMPTOMS TO REPORT IMMEDIATELY:   Following lower endoscopy (colonoscopy or flexible sigmoidoscopy):  Excessive amounts of blood in the stool  Significant tenderness or worsening of abdominal pains  Swelling of the abdomen that is new, acute  Fever of 100F or higher   For urgent or emergent issues, a gastroenterologist can be reached at any hour by calling [phone]. Do not use MyChart messaging for urgent concerns.    DIET:  We do recommend a small meal at first, but then you may proceed to your regular diet.  Drink plenty of fluids but you should avoid alcoholic beverages for 24 hours. Try to  increase the fiber in your diet, and drink plenty of water.  ACTIVITY:  You should plan to take it easy for the rest of today and you should NOT DRIVE or use heavy machinery until tomorrow (because of the sedation medicines used during the test).    FOLLOW UP: Our staff will call the number listed on your records 48-72 hours following your procedure to check on you and address any questions or concerns that you may have regarding the information given to you following your procedure. If we do not reach you, we will leave a message.  We will attempt to reach you two times.  During this call, we will ask if you have developed any symptoms of COVID 19. If you develop any symptoms (ie: fever, flu-like symptoms, shortness of breath, cough etc.) before then, please call [phone].  If you test positive for Covid 19 in the 2 weeks post procedure, please call and report this information to us.    If any biopsies were taken you will be contacted by phone or by letter within the next 1-3 weeks.  Please call us at [phone] if you have not heard about the biopsies in 3 weeks.    SIGNATURES/CONFIDENTIALITY: You and/or your care partner have signed paperwork which will be entered into your electronic medical record.  These signatures attest to the fact that that the information above on your After Visit Summary has been reviewed   reviewed and is understood.  Full responsibility of the confidentiality of this discharge information lies with you and/or your care-partner. 

## 2020-05-13 NOTE — Progress Notes (Signed)
Report to PACU, RN, vss, BBS= Clear.  

## 2020-05-15 ENCOUNTER — Telehealth: Payer: Self-pay | Admitting: *Deleted

## 2020-05-15 NOTE — Telephone Encounter (Signed)
  Follow up Call-  Call back number 05/13/2020  Post procedure Call Back phone  # [phone]  Permission to leave phone message Yes  Some recent data might be hidden     Patient questions:  Do you have a fever, pain , or abdominal swelling? No. Pain Score  0 *  Have you tolerated food without any problems? Yes.    Have you been able to return to your normal activities? Yes.    Do you have any questions about your discharge instructions: Diet   No. Medications  No. Follow up visit  No.  Do you have questions or concerns about your Care? No.  Actions: * If pain score is 4 or above: No action needed, pain <4.  1. Have you developed a fever since your procedure? no  2.   Have you had an respiratory symptoms (SOB or cough) since your procedure? no  3.   Have you tested positive for COVID 19 since your procedure no  4.   Have you had any family members/close contacts diagnosed with the COVID 19 since your procedure?  no   If yes to any of these questions please route to Joylene John, RN and Erenest Rasher, RN

## 2020-05-16 ENCOUNTER — Encounter: Payer: Self-pay | Admitting: Internal Medicine

## 2020-05-31 ENCOUNTER — Other Ambulatory Visit: Payer: Self-pay | Admitting: Family

## 2020-06-27 ENCOUNTER — Ambulatory Visit: Payer: Medicare Other

## 2020-06-27 ENCOUNTER — Encounter: Payer: Medicare Other | Admitting: Family

## 2020-07-02 ENCOUNTER — Encounter: Payer: Self-pay | Admitting: Family

## 2020-07-07 ENCOUNTER — Ambulatory Visit (INDEPENDENT_AMBULATORY_CARE_PROVIDER_SITE_OTHER): Payer: Medicare Other | Admitting: Psychology

## 2020-07-07 DIAGNOSIS — F411 Generalized anxiety disorder: Secondary | ICD-10-CM

## 2020-07-09 ENCOUNTER — Ambulatory Visit: Payer: Medicare Other

## 2020-07-09 ENCOUNTER — Encounter: Payer: Medicare Other | Admitting: Family

## 2020-07-14 ENCOUNTER — Encounter: Payer: Self-pay | Admitting: Family

## 2020-07-14 ENCOUNTER — Other Ambulatory Visit: Payer: Self-pay

## 2020-07-14 ENCOUNTER — Ambulatory Visit (INDEPENDENT_AMBULATORY_CARE_PROVIDER_SITE_OTHER): Payer: Medicare Other | Admitting: Family

## 2020-07-14 VITALS — BP 118/70 | HR 80 | Temp 98.0°F | Wt 172.2 lb

## 2020-07-14 DIAGNOSIS — Z13220 Encounter for screening for lipoid disorders: Secondary | ICD-10-CM

## 2020-07-14 DIAGNOSIS — E039 Hypothyroidism, unspecified: Secondary | ICD-10-CM | POA: Diagnosis not present

## 2020-07-14 DIAGNOSIS — Z0000 Encounter for general adult medical examination without abnormal findings: Secondary | ICD-10-CM | POA: Diagnosis not present

## 2020-07-14 DIAGNOSIS — E559 Vitamin D deficiency, unspecified: Secondary | ICD-10-CM

## 2020-07-14 DIAGNOSIS — Z1159 Encounter for screening for other viral diseases: Secondary | ICD-10-CM | POA: Diagnosis not present

## 2020-07-14 DIAGNOSIS — Z23 Encounter for immunization: Secondary | ICD-10-CM | POA: Diagnosis not present

## 2020-07-14 NOTE — Progress Notes (Signed)
Candace Frey is a 69 y.o. female with the following history as recorded in EpicCare:  Patient Active Problem List   Diagnosis Date Noted  . Chronic obstructive lung disease ([place]) 05/23/2019  . COPD GOLD I  07/05/2017  . Multiple pulmonary nodules determined by computed tomography of lung 07/05/2017  . Acute deep vein thrombosis (DVT) of distal vein of right lower extremity ([place]) 07/22/2016  . Acute medial meniscal tear 01/29/2015    Current Outpatient Medications  Medication Sig Dispense Refill  . acetaminophen (TYLENOL) 325 MG tablet Take 650 mg by mouth every 6 (six) hours as needed.    . Calcium Carbonate-Vitamin D (CALCIUM + D PO) Take 1 tablet by mouth daily.     . Estradiol 0.75 MG/1.25 GM (0.06%) topical gel Place 1.25 g onto the skin daily.    . Fluticasone-Umeclidin-Vilant (TRELEGY ELLIPTA) 100-62.5-25 MCG/INH AEPB Inhale 1 puff into the lungs daily. 60 each 5  . Ibuprofen (ADVIL) 200 MG CAPS Take by mouth. PRN    . Multiple Vitamin (MULTIVITAMIN) tablet Take 1 tablet by mouth daily.    . Multiple Vitamins-Minerals (PRESERVISION AREDS 2 PO) Take 1 tablet by mouth 2 (two) times daily.    Marland Kitchen PREVIDENT 5000 BOOSTER PLUS 1.1 % PSTE Place onto teeth.    Marland Kitchen PROAIR HFA 108 (90 Base) MCG/ACT inhaler     . progesterone (PROMETRIUM) 100 MG capsule progesterone micronized 100 mg capsule  Take 1 capsule every day by oral route.    Marland Kitchen SYNTHROID 100 MCG tablet TAKE 1 TABLET BY MOUTH  DAILY BEFORE BREAKFAST 90 tablet 0   No current facility-administered medications for this visit.    Allergies: Patient has no known allergies.  Past Medical History:  Diagnosis Date  . Acute deep vein thrombosis (DVT) of distal vein of right lower extremity ([place]) 07/22/2016  . Acute medial meniscal tear 01/29/2015  . Chicken pox   . Chronic obstructive lung disease ([place]) 05/23/2019  . Colon polyps   . Complication of anesthesia    slow to wake up   . COPD (chronic obstructive pulmonary disease) ([place])    . Genital warts   . Multiple pulmonary nodules determined by computed tomography of lung 07/05/2017   CT 03/03/17  Right lower lobe nodularity described on the prior exam is less apparent today, favored to represent an area of scarring. other tiny nodules described on the prior are not readily apparent. No enlarging or dominant nodule identified > rec continue low dose annual  screening until 2023 per PCP (pt has fm hx lung ca)   . Osteopenia   . Shortness of breath dyspnea    with exertion was a smoker for many year per patient   . Thyroid disease   . UTI (urinary tract infection)     Past Surgical History:  Procedure Laterality Date  . BREAST BIOPSY    . COLONOSCOPY    . KNEE ARTHROSCOPY Right 01/29/2015   Procedure: RIGHT KNEE ARTHROSCOPY WITH DEBRIDEMENT  ;  Surgeon: Gaynelle Arabian, MD;  Location: [facility];  Service: Orthopedics;  Laterality: Right;  . THYROIDECTOMY, PARTIAL  1998    Family History  Problem Relation Age of Onset  . Dementia Mother   . Parkinson's disease Mother   . Lung cancer Father   . Prostate cancer Father   . CVA Father   . Colon cancer Neg Hx   . Esophageal cancer Neg Hx   . Rectal cancer Neg Hx   . Stomach cancer Neg  Hx     Social History   Tobacco Use  . Smoking status: Former Smoker    Packs/day: 2.00    Years: 43.00    Pack years: 86.00    Types: Cigarettes    Quit date: 11/22/2006    Years since quitting: 13.6  . Smokeless tobacco: Never Used  Substance Use Topics  . Alcohol use: Yes    Alcohol/week: 7.0 standard drinks    Types: 7 Glasses of wine per week    Comment: 2 glasses of wine per night    Subjective:  Presents for yearly CPE today; scheduled to see endocrine later this week; Would like to review vaccines/ update labs;  Does see GYN at [facility]- managing mammogram there;  Review of Systems  Constitutional: Negative.   HENT: Negative.   Eyes: Negative.   Respiratory: Negative.   Cardiovascular: Negative.    Gastrointestinal: Negative.   Genitourinary: Negative.   Musculoskeletal: Negative.   Skin: Negative.   Neurological: Negative.   Endo/Heme/Allergies: Negative.   Psychiatric/Behavioral: Negative.      Objective:  Vitals:   07/14/20 1352  BP: 118/70  Pulse: 80  Temp: 98 F (36.7 C)  TempSrc: Oral  SpO2: 97%  Weight: 172 lb 3.2 oz (78.1 kg)    General: Well developed, well nourished, in no acute distress  Skin : Warm and dry.  Head: Normocephalic and atraumatic  Eyes: Sclera and conjunctiva clear; pupils round and reactive to light; extraocular movements intact  Ears: External normal; canals clear; tympanic membranes normal  Oropharynx: Pink, supple. No suspicious lesions  Neck: Supple without thyromegaly, adenopathy  Lungs: Respirations unlabored; clear to auscultation bilaterally without wheeze, rales, rhonchi  CVS exam: normal rate and regular rhythm.  Abdomen: Soft; nontender; nondistended; normoactive bowel sounds; no masses or hepatosplenomegaly  Musculoskeletal: No deformities; no active joint inflammation  Extremities: No edema, cyanosis, clubbing  Vessels: Symmetric bilaterally  Neurologic: Alert and oriented; speech intact; face symmetrical; moves all extremities well; CNII-XII intact without focal deficit   Assessment:  1. PE (physical exam), annual   2. Lipid screening   3. Hypothyroidism, unspecified type   4. Need for hepatitis C screening test   5. Vitamin D deficiency     Plan:  Age appropriate preventive healthcare needs addressed; encouraged regular eye doctor and dental exams; encouraged regular exercise; will update labs and refills as needed today; follow-up to be determined;  This visit occurred during the SARS-CoV-2 public health emergency.  Safety protocols were in place, including screening questions prior to the visit, additional usage of staff PPE, and extensive cleaning of exam room while observing appropriate contact time as indicated for  disinfecting solutions.     No follow-ups on file.  Orders Placed This Encounter  Procedures  . Pneumococcal conjugate vaccine 13-valent IM  . CBC with Differential/Platelet    Standing Status:   Future    Number of Occurrences:   1    Standing Expiration Date:   07/14/2021  . Comp Met (CMET)    Standing Status:   Future    Number of Occurrences:   1    Standing Expiration Date:   07/14/2021  . Lipid panel    Standing Status:   Future    Number of Occurrences:   1    Standing Expiration Date:   07/14/2021  . Thyroid Panel With TSH    Standing Status:   Future    Number of Occurrences:   1  Standing Expiration Date:   07/14/2021  . Hepatitis C Antibody    Standing Status:   Future    Number of Occurrences:   1    Standing Expiration Date:   07/14/2021  . Vitamin D (25 hydroxy)    Standing Status:   Future    Number of Occurrences:   1    Standing Expiration Date:   07/14/2021    Requested Prescriptions    No prescriptions requested or ordered in this encounter

## 2020-07-15 LAB — CBC WITH DIFFERENTIAL/PLATELET
Absolute Monocytes: 543 cells/uL (ref 200–950)
Basophils Absolute: 22 cells/uL (ref 0–200)
Basophils Relative: 0.4 %
Eosinophils Absolute: 28 cells/uL (ref 15–500)
Eosinophils Relative: 0.5 %
HCT: 40.2 % (ref 35.0–45.0)
Hemoglobin: 13.5 g/dL (ref 11.7–15.5)
Lymphs Abs: 1893 cells/uL (ref 850–3900)
MCH: 32 pg (ref 27.0–33.0)
MCHC: 33.6 g/dL (ref 32.0–36.0)
MCV: 95.3 fL (ref 80.0–100.0)
MPV: 11.3 fL (ref 7.5–12.5)
Monocytes Relative: 9.7 %
Neutro Abs: 3114 cells/uL (ref 1500–7800)
Neutrophils Relative %: 55.6 %
Platelets: 210 10*3/uL (ref 140–400)
RBC: 4.22 10*6/uL (ref 3.80–5.10)
RDW: 12.4 % (ref 11.0–15.0)
Total Lymphocyte: 33.8 %
WBC: 5.6 10*3/uL (ref 3.8–10.8)

## 2020-07-15 LAB — COMPREHENSIVE METABOLIC PANEL
AG Ratio: 1.9 (calc) (ref 1.0–2.5)
ALT: 15 U/L (ref 6–29)
AST: 17 U/L (ref 10–35)
Albumin: 4.2 g/dL (ref 3.6–5.1)
Alkaline phosphatase (APISO): 72 U/L (ref 37–153)
BUN: 13 mg/dL (ref 7–25)
CO2: 25 mmol/L (ref 20–32)
Calcium: 9.2 mg/dL (ref 8.6–10.4)
Chloride: 104 mmol/L (ref 98–110)
Creat: 0.77 mg/dL (ref 0.60–0.93)
Globulin: 2.2 g/dL (calc) (ref 1.9–3.7)
Glucose, Bld: 91 mg/dL (ref 65–99)
Potassium: 4.7 mmol/L (ref 3.5–5.3)
Sodium: 139 mmol/L (ref 135–146)
Total Bilirubin: 0.4 mg/dL (ref 0.2–1.2)
Total Protein: 6.4 g/dL (ref 6.1–8.1)

## 2020-07-15 LAB — THYROID PANEL WITH TSH
Free Thyroxine Index: 2.9 (ref 1.4–3.8)
T3 Uptake: 34 % (ref 22–35)
T4, Total: 8.5 ug/dL (ref 5.1–11.9)
TSH: 0.4 mIU/L (ref 0.40–4.50)

## 2020-07-15 LAB — LIPID PANEL
Cholesterol: 172 mg/dL (ref <200)
HDL: 81 mg/dL (ref >=50)
LDL Cholesterol (Calc): 75 mg/dL (calc)
Non-HDL Cholesterol (Calc): 91 mg/dL (calc) (ref <130)
Total CHOL/HDL Ratio: 2.1 (calc) (ref <5.0)
Triglycerides: 75 mg/dL (ref <150)

## 2020-07-15 LAB — HEPATITIS C ANTIBODY
Hepatitis C Ab: NONREACTIVE
SIGNAL TO CUT-OFF: 0.01 (ref <1.00)

## 2020-07-15 LAB — VITAMIN D 25 HYDROXY (VIT D DEFICIENCY, FRACTURES): Vit D, 25-Hydroxy: 43 ng/mL (ref 30–100)

## 2020-07-16 ENCOUNTER — Ambulatory Visit: Payer: Medicare Other | Admitting: Internal Medicine

## 2020-07-17 ENCOUNTER — Other Ambulatory Visit: Payer: Self-pay

## 2020-07-17 ENCOUNTER — Ambulatory Visit: Payer: Medicare Other | Admitting: Internal Medicine

## 2020-07-17 VITALS — BP 124/72 | HR 83 | Ht 69.02 in | Wt 169.2 lb

## 2020-07-17 DIAGNOSIS — L679 Hair color and hair shaft abnormality, unspecified: Secondary | ICD-10-CM

## 2020-07-17 DIAGNOSIS — E039 Hypothyroidism, unspecified: Secondary | ICD-10-CM | POA: Diagnosis not present

## 2020-07-17 DIAGNOSIS — R6889 Other general symptoms and signs: Secondary | ICD-10-CM | POA: Diagnosis not present

## 2020-07-17 MED ORDER — SYNTHROID 100 MCG PO TABS
100.0000 ug | ORAL_TABLET | Freq: Every day | ORAL | 3 refills | Status: DC
Start: 2020-07-17 — End: 2021-06-18

## 2020-07-17 NOTE — Progress Notes (Signed)
Name: Candace Frey  MRN/ DOB: [id number], [birth year]    Age/ Sex: 69 y.o., female     PCP: Marrian Salvage, [place]   Reason for Endocrinology Evaluation: Hypothyroidism     Initial Endocrinology Clinic Visit: 07/16/2020    PATIENT IDENTIFIER: Candace Frey is a 69 y.o., female with a past medical history of COPD and Hypothyroidism. She has followed with [facility] since 07/16/2020 for consultative assistance with management of her Hypothyroidism.   HISTORICAL SUMMARY:  She is S/P left lobectomy in 1999 due to benign reasons ( she can not recall). She has been on LT- 4 replacement since then .   She has been diagnosed with thyroid nodules many years ago, she used to have serial ultrasounds, then she moved to the area ~ 2014 and initially had a couple of ultrasound but was lost to follow up.   A repeat ultrasound in 02/2020 revealed a surgically absent left lobe with subcentimeter thyroid nodules on the right and no dedicated follow up was recommended.   No FH of thyroid disease.  SUBJECTIVE:     Today (07/17/2020):  Candace Frey is here for a follow up on MNG and hypothyroidism.    She is compliant with LT-4 replacement  Has noted thin hair and flat hair  She is more tired  She is c/o weight gain even though in review other chart    She stopped Biotin     HISTORY:  Past Medical History:  Past Medical History:  Diagnosis Date  . Acute deep vein thrombosis (DVT) of distal vein of right lower extremity ([place]) 07/22/2016  . Acute medial meniscal tear 01/29/2015  . Chicken pox   . Chronic obstructive lung disease ([place]) 05/23/2019  . Colon polyps   . Complication of anesthesia    slow to wake up   . COPD (chronic obstructive pulmonary disease) ([place])   . Genital warts   . Multiple pulmonary nodules determined by computed tomography of lung 07/05/2017   CT 03/03/17  Right lower lobe nodularity described on the prior exam is less apparent  today, favored to represent an area of scarring. other tiny nodules described on the prior are not readily apparent. No enlarging or dominant nodule identified > rec continue low dose annual  screening until 2023 per PCP (pt has fm hx lung ca)   . Osteopenia   . Shortness of breath dyspnea    with exertion was a smoker for many year per patient   . Thyroid disease   . UTI (urinary tract infection)    Past Surgical History:  Past Surgical History:  Procedure Laterality Date  . BREAST BIOPSY    . COLONOSCOPY    . KNEE ARTHROSCOPY Right 01/29/2015   Procedure: RIGHT KNEE ARTHROSCOPY WITH DEBRIDEMENT  ;  Surgeon: Gaynelle Arabian, MD;  Location: [facility];  Service: Orthopedics;  Laterality: Right;  . THYROIDECTOMY, PARTIAL  1998    Social History:  reports that she quit smoking about 13 years ago. Her smoking use included cigarettes. She has a 86.00 pack-year smoking history. She has never used smokeless tobacco. She reports current alcohol use of about 7.0 standard drinks of alcohol per week. She reports that she does not use drugs. Family History:  Family History  Problem Relation Age of Onset  . Dementia Mother   . Parkinson's disease Mother   . Lung cancer Father   . Prostate cancer Father   . CVA Father   . Colon  cancer Neg Hx   . Esophageal cancer Neg Hx   . Rectal cancer Neg Hx   . Stomach cancer Neg Hx      HOME MEDICATIONS: Allergies as of 07/17/2020   No Known Allergies     Medication List       Accurate as of July 17, 2020  9:29 AM. If you have any questions, ask your nurse or doctor.        acetaminophen 325 MG tablet Commonly known as: TYLENOL Take 650 mg by mouth every 6 (six) hours as needed.   Advil 200 MG Caps Generic drug: Ibuprofen Take by mouth. PRN   CALCIUM + D PO Take 1 tablet by mouth daily.   Estradiol 0.75 MG/1.25 GM (0.06%) topical gel Place 1.25 g onto the skin daily.   multivitamin tablet Take 1 tablet by mouth daily.   PRESERVISION  AREDS 2 PO Take 1 tablet by mouth 2 (two) times daily.   PreviDent 5000 Booster Plus 1.1 % Pste Generic drug: Sodium Fluoride Place onto teeth.   ProAir HFA 108 (90 Base) MCG/ACT inhaler Generic drug: albuterol   progesterone 100 MG capsule Commonly known as: PROMETRIUM progesterone micronized 100 mg capsule  Take 1 capsule every day by oral route.   Synthroid 100 MCG tablet Generic drug: levothyroxine TAKE 1 TABLET BY MOUTH  DAILY BEFORE BREAKFAST   Trelegy Ellipta 100-62.5-25 MCG/INH Aepb Generic drug: Fluticasone-Umeclidin-Vilant Inhale 1 puff into the lungs daily.         OBJECTIVE:   PHYSICAL EXAM: VS: BP 124/72 (BP Location: Left Arm, Patient Position: Sitting)   Pulse 83   Ht 5' 9.02" (1.753 m)   Wt 169 lb 3.2 oz (76.7 kg)   SpO2 95%   BMI 24.97 kg/m    EXAM: General: Pt appears well and is in NAD  Neck: General: Supple without adenopathy. Thyroid: Thyroid size normal.  No goiter or nodules appreciated.  Lungs: Clear with good BS bilat with no rales, rhonchi, or wheezes  Heart: Auscultation: RRR.  Abdomen: Normoactive bowel sounds, soft, nontender, without masses or organomegaly palpable  Extremities:  BL LE: No pretibial edema normal ROM and strength.  Mental Status: Judgment, insight: Intact Orientation: Oriented to time, place, and person Memory: Intact for recent and remote events Mood and affect: No depression, anxiety, or agitation     DATA REVIEWED: Results for MURLINE, WEIGEL" (MRN [id number]) as of 07/17/2020 09:30  Ref. Range 07/14/2020 14:42  TSH Latest Ref Range: 0.40 - 4.50 mIU/L 0.40  Thyroxine (T4) Latest Ref Range: 5.1 - 11.9 mcg/dL 8.5  Free Thyroxine Index Latest Ref Range: 1.4 - 3.8  2.9  T3 Uptake Latest Ref Range: 22 - 35 % 34     Thyroid Ultrasound 02/29/2020  FINDINGS: Parenchymal Echotexture: Normal  Isthmus: 0.2 cm thickness, stable  Right lobe: 5.5 x 1 x 1.7 cm, previously 5.5 x 0.9 x 1.7  Left lobe:  Surgically absent  _________________________________________________________  Estimated total number of nodules >/= 1 cm: 0  Number of spongiform nodules >/=  2 cm not described below (TR1): 0  Number of mixed cystic and solid nodules >/= 1.5 cm not described below (TR2): 0  _________________________________________________________  0.7 cm hypoechoic nodule without calcifications, mid right, stable; This nodule does NOT meet TI-RADS criteria for biopsy or dedicated follow-up.  0.7 cm complex cyst, inferior right, previously 0.6; This nodule does NOT meet TI-RADS criteria for biopsy or dedicated follow-up.  IMPRESSION: 1. No residual/recurrent tissue post  left hemithyroidectomy. 2. Stable residual right lobe. No indication for biopsy or dedicated imaging follow-up.  ASSESSMENT / PLAN / RECOMMENDATIONS:   1. Right thyroid Nodules :  - She is clinically euthyroid - No local neck symptoms.  - She has had 2 thyroid ultrasounds in 2015 , 2016 and 2021 with stability  - We discussed that  based on at least 5 yr stability ,  serial ultrasound will not be recommended unless she develops local neck symptoms.    2. Hypothyroidism :   - Pt is Biochemically - Pt educated extensively on the correct way to take levothyroxine (first thing in the morning with water, 30 minutes before eating or taking other medications). - Pt encouraged to double dose the following day if she were to miss a dose given long half-life of levothyroxine.   Medications : Levothyroxine 100 mcg daily   3. Hair Abnormality :   - I have assured her that her thyroid is normal. I am not comfortable prescribing T3 ( liothyronine ) to her due to increased risk of cardiac excitability with age > 70. - This could be related to her stopping Biotin. I have explained to her that she could go back to it, as long as she remembers to hold it 3 days before future thyroid testing     4. Inability to lose  weight :  - She actually has lost weight.  - Pt may use Noom App if interesed - I again have assured her of normal thyroid function      Signed electronically by: Mack Guise, MD  [facility]  [facility] [address], [place], [place] [address] Phone: [phone] FAX: [phone]      CC: Marrian Salvage, [place] [address] Phone: [phone]  Fax: [phone]   Return to Endocrinology clinic as below: Future Appointments  Date Time Provider [place]  07/17/2020  9:30 AM Skylinn Vialpando, Melanie Crazier, MD [facility] None  07/29/2020  9:00 AM [facility]

## 2020-07-17 NOTE — Patient Instructions (Signed)
-   Continue Levothyroxine 100 mcg daily    - You can go back on the Biotin to help your hair. Please remember to hold it 3 days before your thyroid labs  - Please consider hair masks - these are usually applied weekly  - Please look in to noom App , this is for weight loss if this interests you

## 2020-07-29 ENCOUNTER — Inpatient Hospital Stay: Admission: RE | Admit: 2020-07-29 | Payer: Medicare Other | Source: Ambulatory Visit

## 2020-08-13 ENCOUNTER — Telehealth: Payer: Self-pay | Admitting: Acute Care

## 2020-08-13 ENCOUNTER — Other Ambulatory Visit: Payer: Self-pay

## 2020-08-13 ENCOUNTER — Ambulatory Visit (INDEPENDENT_AMBULATORY_CARE_PROVIDER_SITE_OTHER)
Admission: RE | Admit: 2020-08-13 | Discharge: 2020-08-13 | Disposition: A | Discharge status: Home / Self Care | Payer: Medicare Other | Source: Ambulatory Visit | Attending: Acute Care | Admitting: Acute Care

## 2020-08-13 DIAGNOSIS — Z87891 Personal history of nicotine dependence: Secondary | ICD-10-CM

## 2020-08-13 DIAGNOSIS — Z122 Encounter for screening for malignant neoplasm of respiratory organs: Secondary | ICD-10-CM

## 2020-08-14 NOTE — Progress Notes (Signed)
Please call patient and let them  know their  low dose Ct was read as a Lung RADS 2: nodules that are benign in appearance and behavior with a very low likelihood of becoming a clinically active cancer due to size or lack of growth. Recommendation per radiology is for a repeat LDCT in 12 months. .Please let them  know we will order and schedule their  annual screening scan for 07/2021. Please let them  know there was notation of CAD on their  scan.  Please remind the patient  that this is a non-gated exam therefore degree or severity of disease  cannot be determined. Please have them  follow up with their PCP regarding potential risk factor modification, dietary therapy or pharmacologic therapy if clinically indicated. Pt.  is  not  currently on statin therapy. Please place order for annual  screening scan for  07/2021 and fax results to PCP. Thanks so much.   Langley Gauss, I think this patient wanted to schedule a tele visit to review her results. Thanks

## 2020-08-15 NOTE — Telephone Encounter (Signed)
Spoke with pt and scheduled telelvisit with Eric Form, NP 08/18/20 4:00 to discuss low dose CT results. PT verbalized understanding. Nothing further needed.

## 2020-08-18 ENCOUNTER — Other Ambulatory Visit: Payer: Self-pay

## 2020-08-18 ENCOUNTER — Ambulatory Visit: Payer: Medicare Other | Admitting: Acute Care

## 2020-08-18 NOTE — Progress Notes (Signed)
Virtual Visit via Video Note  I connected with Fabiana C Trettin on 08/18/20 at  4:00 PM EDT by a video enabled telemedicine application and verified that I am speaking with the correct person using two identifiers.  Location: Patient: At home Provider: [place], [place], [place], [address]    I discussed the limitations of evaluation and management by telemedicine and the availability of in person appointments. The patient expressed understanding and agreed to proceed.  History of Present Illness: Pt was seen through the Lung Cancer Screening program. Her scan was read as a Lung RADS 2: nodules that are benign in appearance and behavior with a very low likelihood of becoming a clinically active cancer due to size or lack of growth. Recommendation per radiology is for a repeat LDCT in 12 months.She was called with her results, but she has some questions about her scan.    Observations/Objective:   Assessment and Plan:   Follow Up Instructions:    I discussed the assessment and treatment plan with the patient. The patient was provided an opportunity to ask questions and all were answered. The patient agreed with the plan and demonstrated an understanding of the instructions.   The patient was advised to call back or seek an in-person evaluation if the symptoms worsen or if the condition fails to improve as anticipated.  I provided *** minutes of non-face-to-face time during this encounter.   Magdalen Spatz, NP

## 2020-08-27 ENCOUNTER — Ambulatory Visit: Payer: Medicare Other

## 2020-08-29 ENCOUNTER — Ambulatory Visit: Payer: Medicare Other | Admitting: Internal Medicine

## 2020-09-01 ENCOUNTER — Ambulatory Visit (INDEPENDENT_AMBULATORY_CARE_PROVIDER_SITE_OTHER): Payer: Medicare Other | Admitting: Psychology

## 2020-09-01 DIAGNOSIS — F411 Generalized anxiety disorder: Secondary | ICD-10-CM | POA: Diagnosis not present

## 2020-09-06 ENCOUNTER — Ambulatory Visit (INDEPENDENT_AMBULATORY_CARE_PROVIDER_SITE_OTHER): Payer: Medicare Other

## 2020-09-06 DIAGNOSIS — Z23 Encounter for immunization: Secondary | ICD-10-CM

## 2020-09-10 ENCOUNTER — Other Ambulatory Visit: Payer: Self-pay

## 2020-11-17 ENCOUNTER — Telehealth: Payer: Self-pay | Admitting: Family

## 2020-11-17 NOTE — Telephone Encounter (Signed)
Pt will call back to schedule Medicare Annual Wellness Visit (AWV) with Nurse Health Advisor. This can be scheduled IN OFFICE or TELEPHONE VISIT.   This should be a 45 minute visit  Last AWV 06/22/19

## 2020-11-25 ENCOUNTER — Ambulatory Visit (INDEPENDENT_AMBULATORY_CARE_PROVIDER_SITE_OTHER): Payer: Medicare Other | Admitting: Psychology

## 2020-11-25 DIAGNOSIS — F411 Generalized anxiety disorder: Secondary | ICD-10-CM | POA: Diagnosis not present

## 2020-12-05 ENCOUNTER — Telehealth (INDEPENDENT_AMBULATORY_CARE_PROVIDER_SITE_OTHER): Payer: Medicare Other | Admitting: Family

## 2020-12-05 ENCOUNTER — Telehealth: Payer: Self-pay | Admitting: Family

## 2020-12-05 ENCOUNTER — Other Ambulatory Visit: Payer: Self-pay

## 2020-12-05 DIAGNOSIS — U071 COVID-19: Secondary | ICD-10-CM

## 2020-12-05 NOTE — Progress Notes (Signed)
Candace Frey is a 70 y.o. female with the following history as recorded in EpicCare:  Patient Active Problem List   Diagnosis Date Noted  . Acquired hypothyroidism 07/17/2020  . Chronic obstructive lung disease ([place]) 05/23/2019  . COPD GOLD I  07/05/2017  . Multiple pulmonary nodules determined by computed tomography of lung 07/05/2017  . Acute deep vein thrombosis (DVT) of distal vein of right lower extremity ([place]) 07/22/2016  . Acute medial meniscal tear 01/29/2015    Current Outpatient Medications  Medication Sig Dispense Refill  . acetaminophen (TYLENOL) 325 MG tablet Take 650 mg by mouth every 6 (six) hours as needed.    . Calcium Carbonate-Vitamin D (CALCIUM + D PO) Take 1 tablet by mouth daily.     . Estradiol 0.75 MG/1.25 GM (0.06%) topical gel Place 1.25 g onto the skin daily.    . Fluticasone-Umeclidin-Vilant (TRELEGY ELLIPTA) 100-62.5-25 MCG/INH AEPB Inhale 1 puff into the lungs daily. 60 each 5  . Ibuprofen (ADVIL) 200 MG CAPS Take by mouth. PRN    . Multiple Vitamin (MULTIVITAMIN) tablet Take 1 tablet by mouth daily.    . Multiple Vitamins-Minerals (PRESERVISION AREDS 2 PO) Take 1 tablet by mouth 2 (two) times daily.    Marland Kitchen PREVIDENT 5000 BOOSTER PLUS 1.1 % PSTE Place onto teeth.    Marland Kitchen PROAIR HFA 108 (90 Base) MCG/ACT inhaler     . progesterone (PROMETRIUM) 100 MG capsule progesterone micronized 100 mg capsule  Take 1 capsule every day by oral route.    Marland Kitchen SYNTHROID 100 MCG tablet Take 1 tablet (100 mcg total) by mouth daily before breakfast. 90 tablet 3   No current facility-administered medications for this visit.    Allergies: Patient has no known allergies.  Past Medical History:  Diagnosis Date  . Acute deep vein thrombosis (DVT) of distal vein of right lower extremity ([place]) 07/22/2016  . Acute medial meniscal tear 01/29/2015  . Chicken pox   . Chronic obstructive lung disease ([place]) 05/23/2019  . Colon polyps   . Complication of anesthesia    slow to wake up    . COPD (chronic obstructive pulmonary disease) ([place])   . Genital warts   . Multiple pulmonary nodules determined by computed tomography of lung 07/05/2017   CT 03/03/17  Right lower lobe nodularity described on the prior exam is less apparent today, favored to represent an area of scarring. other tiny nodules described on the prior are not readily apparent. No enlarging or dominant nodule identified > rec continue low dose annual  screening until 2023 per PCP (pt has fm hx lung ca)   . Osteopenia   . Shortness of breath dyspnea    with exertion was a smoker for many year per patient   . Thyroid disease   . UTI (urinary tract infection)     Past Surgical History:  Procedure Laterality Date  . BREAST BIOPSY    . COLONOSCOPY    . KNEE ARTHROSCOPY Right 01/29/2015   Procedure: RIGHT KNEE ARTHROSCOPY WITH DEBRIDEMENT  ;  Surgeon: Gaynelle Arabian, MD;  Location: [facility];  Service: Orthopedics;  Laterality: Right;  . THYROIDECTOMY, PARTIAL  1998    Family History  Problem Relation Age of Onset  . Dementia Mother   . Parkinson's disease Mother   . Lung cancer Father   . Prostate cancer Father   . CVA Father   . Colon cancer Neg Hx   . Esophageal cancer Neg Hx   . Rectal cancer Neg  Hx   . Stomach cancer Neg Hx     Social History   Tobacco Use  . Smoking status: Former Smoker    Packs/day: 2.00    Years: 43.00    Pack years: 86.00    Types: Cigarettes    Quit date: 11/22/2006    Years since quitting: 14.0  . Smokeless tobacco: Never Used  Substance Use Topics  . Alcohol use: Yes    Alcohol/week: 7.0 standard drinks    Types: 7 Glasses of wine per week    Comment: 2 glasses of wine per night    Subjective:    I connected with Candace Frey on 12/05/20 at 11:00 AM EST by a video enabled telemedicine application and verified that I am speaking with the correct person using two identifiers.   I discussed the limitations of evaluation and management by telemedicine and the  availability of in person appointments. The patient expressed understanding and agreed to proceed. Provider in office/ patient is at home; provider and patient are only 2 people on video call.   Started with headache/ sore throat earlier this week; tested positive for COVID yesterday; wanted to be proactive about treatment option; no shortness of breath or difficulty breathing; fully vaccinated and boosted;      Objective:  There were no vitals filed for this visit.  General: Well developed, well nourished, in no acute distress  Skin : Warm and dry.  Head: Normocephalic and atraumatic  Lungs: Respirations unlabored;  Neurologic: Alert and oriented; speech intact; face symmetrical;   Assessment:  1. COVID-19     Plan:  Treatment options discussed; ER precautions discussed; will start with Tessalon Perles and increase fluids, rest; she will hold Prednisone and Doxycycline and take only if needed;  No follow-ups on file.  No orders of the defined types were placed in this encounter.   Requested Prescriptions    No prescriptions requested or ordered in this encounter

## 2020-12-05 NOTE — Telephone Encounter (Signed)
[id number] - [place], [place] Phone:  [phone]  Fax:  [phone]     Patient states you were sending in three medications for her but when she checked her pharmacy they didn't have anything

## 2020-12-06 MED ORDER — DOXYCYCLINE HYCLATE 100 MG PO TABS: 100.0000 mg | ORAL_TABLET | Freq: Two times a day (BID) | ORAL | 0 refills | Status: AC

## 2020-12-06 MED ORDER — BENZONATATE 100 MG PO CAPS: 200.0000 mg | ORAL_CAPSULE | Freq: Three times a day (TID) | ORAL | 0 refills | Status: DC | PRN

## 2020-12-06 MED ORDER — PREDNISONE 20 MG PO TABS: ORAL_TABLET | ORAL | 0 refills | Status: DC

## 2020-12-06 NOTE — Telephone Encounter (Signed)
Pt called back. Was very appreciative.

## 2020-12-06 NOTE — Telephone Encounter (Signed)
Pt had a virtual visit. She was supposed to be getting tessalon perles, an antibiotic, and prednisone. She stated the doctor was home sick doing the virtual visit with her. Something must have happened.

## 2020-12-06 NOTE — Telephone Encounter (Signed)
Ok, I sent them.

## 2020-12-08 NOTE — Telephone Encounter (Signed)
[facility] Call/Report: 12/05/2020 Caller States she had a phone call with Dr. Valere Dross today. States she is COVID positive, and was told she would get Tesslon Pearls, prednisone, and Doxycycline for an antibiotic. States she has a cough, and a fever that is elevating. States she has a sore throat, and a headache.  Medication was sent in on 12/06/2020 by Dr.Copland

## 2021-01-12 ENCOUNTER — Ambulatory Visit (INDEPENDENT_AMBULATORY_CARE_PROVIDER_SITE_OTHER): Payer: Medicare Other | Admitting: Psychology

## 2021-01-12 DIAGNOSIS — F411 Generalized anxiety disorder: Secondary | ICD-10-CM

## 2021-02-03 ENCOUNTER — Telehealth: Payer: Self-pay | Admitting: Family

## 2021-02-03 DIAGNOSIS — I824Z1 Acute embolism and thrombosis of unspecified deep veins of right distal lower extremity: Secondary | ICD-10-CM

## 2021-02-03 NOTE — Telephone Encounter (Signed)
Called pt to gather more information. Pt states that she doesn't get along well with her current cardiologist and would like to see the same one her husband goes to. I have sent a referral to the new office that the pt requested.   I have informed her to give the office a day or two to get that referral and to give [place] call if she is having any issues. She stated understanding.   New referral as been sent.

## 2021-02-03 NOTE — Telephone Encounter (Signed)
Patients husband calling, states the patient needs a referral to the cardiologist Dr. Peter Martinique

## 2021-02-13 ENCOUNTER — Ambulatory Visit (INDEPENDENT_AMBULATORY_CARE_PROVIDER_SITE_OTHER): Payer: Medicare Other | Admitting: Psychology

## 2021-02-13 DIAGNOSIS — F411 Generalized anxiety disorder: Secondary | ICD-10-CM

## 2021-02-13 NOTE — Progress Notes (Signed)
Cardiology Office Note   Date:  02/16/2021   ID:  [name], Strong 07-25-1951, MRN [id number]  PCP:  Marrian Salvage, [place]  Cardiologist:    Martinique, MD   Chief Complaint  Patient presents with  . Shortness of Breath      History of Present Illness: Candace Frey is a 70 y.o. female who is seen at the request of Dr Jasmine December for evaluation of dyspnea. She was previously seen by Dr Radford Pax in Sept 2020. Echo showed mild LVH. Coronary calcium score was 0. Myoview study was considered but never done. CT chest does show emphysema. She has been followed by Pulmonary. She reports she has issues with DOE especially going up incline. She did have Covid 19 and thinks her symptoms started then but she states her friend thinks her SOB started prior to this. She states she did have swelling in her right calf a couple of weeks ago. Venous dopplers were negative for DVT. She did have a DVT several years ago following Knee surgery. Never had recurrence. Denies chest pain or palpitations. No family history of CAD. No history of HTN, HLD, DM. Quit smoking 12 years ago.   Past Medical History:  Diagnosis Date  . Acute deep vein thrombosis (DVT) of distal vein of right lower extremity ([place]) 07/22/2016  . Acute medial meniscal tear 01/29/2015  . Chicken pox   . Chronic obstructive lung disease ([place]) 05/23/2019  . Colon polyps   . Complication of anesthesia    slow to wake up   . COPD (chronic obstructive pulmonary disease) ([place])   . Genital warts   . Multiple pulmonary nodules determined by computed tomography of lung 07/05/2017   CT 03/03/17  Right lower lobe nodularity described on the prior exam is less apparent today, favored to represent an area of scarring. other tiny nodules described on the prior are not readily apparent. No enlarging or dominant nodule identified > rec continue low dose annual  screening until 2023 per PCP (pt has fm hx lung ca)   . Osteopenia   . Shortness  of breath dyspnea    with exertion was a smoker for many year per patient   . Thyroid disease   . UTI (urinary tract infection)     Past Surgical History:  Procedure Laterality Date  . BREAST BIOPSY    . COLONOSCOPY    . KNEE ARTHROSCOPY Right 01/29/2015   Procedure: RIGHT KNEE ARTHROSCOPY WITH DEBRIDEMENT  ;  Surgeon: Gaynelle Arabian, MD;  Location: [facility];  Service: Orthopedics;  Laterality: Right;  . THYROIDECTOMY, PARTIAL  1998     Current Outpatient Medications  Medication Sig Dispense Refill  . acetaminophen (TYLENOL) 325 MG tablet Take 650 mg by mouth every 6 (six) hours as needed.    . Calcium Carbonate-Vitamin D (CALCIUM + D PO) Take 1 tablet by mouth daily.     . Estradiol 0.75 MG/1.25 GM (0.06%) topical gel Place 1.25 g onto the skin daily.    . Ibuprofen 200 MG CAPS Take by mouth. PRN    . Multiple Vitamin (MULTIVITAMIN) tablet Take 1 tablet by mouth daily.    . Multiple Vitamins-Minerals (PRESERVISION AREDS 2 PO) Take 1 tablet by mouth 2 (two) times daily.    Marland Kitchen PREVIDENT 5000 BOOSTER PLUS 1.1 % PSTE Place onto teeth.    Marland Kitchen PROAIR HFA 108 (90 Base) MCG/ACT inhaler     . SYNTHROID 100 MCG tablet Take 1 tablet (100 mcg total)  by mouth daily before breakfast. 90 tablet 3   No current facility-administered medications for this visit.    Allergies:   Patient has no known allergies.    Social History:  The patient  reports that she quit smoking about 14 years ago. Her smoking use included cigarettes. She has a 86.00 pack-year smoking history. She has never used smokeless tobacco. She reports current alcohol use of about 7.0 standard drinks of alcohol per week. She reports that she does not use drugs.   Family History:  The patient's family history includes CVA in her father; Dementia in her mother; Lung cancer in her father; Parkinson's disease in her mother; Prostate cancer in her father.    ROS:  Please see the history of present illness.   Otherwise, review of systems are  positive for none.   All other systems are reviewed and negative.    PHYSICAL EXAM: VS:  BP 128/82 (BP Location: Left Arm, Patient Position: Sitting)   Pulse 70   Ht 5\' 9"  (1.753 m)   Wt 172 lb (78 kg)   SpO2 98%   BMI 25.40 kg/m  , BMI Body mass index is 25.4 kg/m. GEN: Well nourished, well developed, in no acute distress  HEENT: normal  Neck: no JVD, carotid bruits, or masses Cardiac: RRR; no murmurs, rubs, or gallops,no edema  Respiratory:  clear to auscultation bilaterally, normal work of breathing GI: soft, nontender, nondistended, + BS MS: no deformity or atrophy  Skin: warm and dry, no rash. Varicose veins in bilateral LE. Neuro:  Strength and sensation are intact Psych: euthymic mood, full affect   EKG:  EKG is ordered today. The ekg ordered today demonstrates NSR with normal Ecg. I have personally reviewed and interpreted this study.    Recent Labs: 07/14/2020: ALT 15; BUN 13; Creat 0.77; Hemoglobin 13.5; Platelets 210; Potassium 4.7; Sodium 139; TSH 0.40    Lipid Panel    Component Value Date/Time   CHOL 172 07/14/2020 1442   TRIG 75 07/14/2020 1442   HDL 81 07/14/2020 1442   CHOLHDL 2.1 07/14/2020 1442   LDLCALC 75 07/14/2020 1442      Wt Readings from Last 3 Encounters:  02/16/21 172 lb (78 kg)  07/17/20 169 lb 3.2 oz (76.7 kg)  07/14/20 172 lb 3.2 oz (78.1 kg)      Other studies Reviewed: Additional studies/ records that were reviewed today include:   EXAM: Coronary Calcium Score  TECHNIQUE: The patient was scanned on a [employer]. Axial non-contrast 3 mm slices were carried out through the heart. The data set was analyzed on a dedicated work station and scored using the [place].  FINDINGS: Non-cardiac: See separate report from [facility].  Ascending Aorta: Normal caliber.  No calcifications.  Pericardium: Normal  Coronary arteries: Normal coronary origins.  IMPRESSION: Coronary calcium score of 0.  This was 0 percentile for age and sex matched control.  Traci Turner   Electronically Signed   By: Fransico Him   On: 03/11/2020 08:58   Echo 03/10/20:IMPRESSIONS    1. Left ventricular ejection fraction, by estimation, is 60 to 65%. The  left ventricle has normal function. The left ventricle has no regional  wall motion abnormalities. There is mild left ventricular hypertrophy.  Left ventricular diastolic parameters  were normal.  2. Right ventricular systolic function is normal. The right ventricular  size is normal. There is mildly elevated pulmonary artery systolic  pressure.  3. The mitral valve is normal  in structure. No evidence of mitral valve  regurgitation. No evidence of mitral stenosis.  4. The aortic valve is tricuspid. Aortic valve regurgitation is not  visualized. No aortic stenosis is present.  5. The inferior vena cava is normal in size with greater than 50%  respiratory variability, suggesting right atrial pressure of 3 mmHg.     ASSESSMENT AND PLAN:  1.  DOE. Structurally heart is OK. I think she is low risk for CAD based on 0 calcium score. Ecg is normal. ? If this is related to emphysema, post Covid, deconditioning. Doubt pulmonary embolic disease with negative venous doppler. I have recommended a cardiopulmonary stress test to further elucidate the cause of her dyspnea.  2. Remote DVT post knee surgery. No recurrence.    Current medicines are reviewed at length with the patient today.  The patient does not have concerns regarding medicines.  The following changes have been made:  no change  Labs/ tests ordered today include:   Orders Placed This Encounter  Procedures  . Cardiopulmonary exercise test     Disposition:   FU TBD  Signed,  [place], MD  02/16/2021 3:41 PM    [facility] [address], [place], [place], [address] Phone [phone], Fax [phone]

## 2021-02-16 ENCOUNTER — Other Ambulatory Visit: Payer: Self-pay

## 2021-02-16 ENCOUNTER — Ambulatory Visit: Payer: Medicare Other | Admitting: Cardiology

## 2021-02-16 ENCOUNTER — Encounter: Payer: Self-pay | Admitting: Cardiology

## 2021-02-16 VITALS — BP 128/82 | HR 70 | Ht 69.0 in | Wt 172.0 lb

## 2021-02-16 DIAGNOSIS — Z86718 Personal history of other venous thrombosis and embolism: Secondary | ICD-10-CM | POA: Diagnosis not present

## 2021-02-16 DIAGNOSIS — R0602 Shortness of breath: Secondary | ICD-10-CM

## 2021-02-16 NOTE — Addendum Note (Signed)
Addended by: Kathyrn Lass on: 02/16/2021 03:46 PM   Modules accepted: Orders

## 2021-02-16 NOTE — Patient Instructions (Signed)
Medication Instructions:  Continue same medications    Lab Work: None ordered   Testing/Procedures: Cardiopulmonary Stress Test   Follow-Up: At [facility], you and your health needs are our priority.  As part of our continuing mission to provide you with exceptional heart care, we have created designated Provider Care Teams.  These Care Teams include your primary Cardiologist (physician) and Advanced Practice Providers (APPs -  Physician Assistants and Nurse Practitioners) who all work together to provide you with the care you need, when you need it.  We recommend signing up for the patient portal called "MyChart".  Sign up information is provided on this After Visit Summary.  MyChart is used to connect with patients for Virtual Visits (Telemedicine).  Patients are able to view lab/test results, encounter notes, upcoming appointments, etc.  Non-urgent messages can be sent to your provider as well.   To learn more about what you can do with MyChart, go to [email or web address].    Your next appointment:  To be determined after test   The format for your next appointment:  Office   Provider:  Dr.Jordan

## 2021-03-09 ENCOUNTER — Other Ambulatory Visit: Payer: Self-pay

## 2021-03-09 ENCOUNTER — Ambulatory Visit (HOSPITAL_COMMUNITY): Payer: Medicare Other | Attending: Cardiology

## 2021-03-09 DIAGNOSIS — Z86718 Personal history of other venous thrombosis and embolism: Secondary | ICD-10-CM | POA: Diagnosis not present

## 2021-03-09 DIAGNOSIS — R0602 Shortness of breath: Secondary | ICD-10-CM

## 2021-06-09 NOTE — Progress Notes (Deleted)
   I, Peterson Lombard, LAT, ATC acting as a scribe for Lynne Leader, MD.  Subjective:    CC: Right knee pain  HPI: Pt is a 70 y/o female c/o R knee pain x /. Pt locates pain to   R knee swelling: Mechanical symptoms: Aggravates: Treatments tried:  Pertinent review of Systems: ***  Relevant historical information: ***   Objective:   There were no vitals filed for this visit. General: Well Developed, well nourished, and in no acute distress.   MSK: ***  Lab and Radiology Results No results found for this or any previous visit (from the past 72 hour(s)). No results found.    Impression and Recommendations:    Assessment and Plan: 70 y.o. female with ***.  PDMP not reviewed this encounter. No orders of the defined types were placed in this encounter.  No orders of the defined types were placed in this encounter.   Discussed warning signs or symptoms. Please see discharge instructions. Patient expresses understanding.   ***

## 2021-06-10 ENCOUNTER — Ambulatory Visit: Payer: Medicare Other | Admitting: Family Medicine

## 2021-06-18 ENCOUNTER — Other Ambulatory Visit: Payer: Self-pay

## 2021-06-18 ENCOUNTER — Ambulatory Visit (INDEPENDENT_AMBULATORY_CARE_PROVIDER_SITE_OTHER): Payer: Medicare Other | Admitting: Family Medicine

## 2021-06-18 ENCOUNTER — Encounter: Payer: Self-pay | Admitting: Family Medicine

## 2021-06-18 VITALS — BP 120/64 | HR 79 | Temp 98.2°F | Ht 69.0 in | Wt 173.4 lb

## 2021-06-18 DIAGNOSIS — I700 Atherosclerosis of aorta: Secondary | ICD-10-CM | POA: Insufficient documentation

## 2021-06-18 DIAGNOSIS — E039 Hypothyroidism, unspecified: Secondary | ICD-10-CM | POA: Diagnosis not present

## 2021-06-18 DIAGNOSIS — J449 Chronic obstructive pulmonary disease, unspecified: Secondary | ICD-10-CM | POA: Diagnosis not present

## 2021-06-18 MED ORDER — SYNTHROID 100 MCG PO TABS: 100.0000 ug | ORAL_TABLET | Freq: Every day | ORAL | 3 refills | Status: DC

## 2021-06-18 NOTE — Progress Notes (Signed)
Phone: [phone]   Subjective:  Patient presents today to establish care with me as their new primary care provider. Patient was formerly a patient Gaylyn Rong, FNP.  Chief Complaint  Patient presents with   Transitions Of Care   See problem oriented charting  The following were reviewed and entered/updated in epic: Past Medical History:  Diagnosis Date   Acute deep vein thrombosis (DVT) of distal vein of right lower extremity ([place]) 07/22/2016   Acute medial meniscal tear 01/29/2015   Chicken pox    Chronic obstructive lung disease ([place]) 05/23/2019   Colon polyps    Complication of anesthesia    slow to wake up    COPD (chronic obstructive pulmonary disease) (HCC)    Genital warts    Multiple pulmonary nodules determined by computed tomography of lung 07/05/2017   CT 03/03/17  Right lower lobe nodularity described on the prior exam is less apparent today, favored to represent an area of scarring. other tiny nodules described on the prior are not readily apparent. No enlarging or dominant nodule identified > rec continue low dose annual  screening until 2023 per PCP (pt has fm hx lung ca)    Osteopenia    Shortness of breath dyspnea    with exertion was a smoker for many year per patient    Thyroid disease    UTI (urinary tract infection)    Patient Active Problem List   Diagnosis Date Noted   Aortic atherosclerosis ([place]) 06/18/2021    Priority: Medium   Acquired hypothyroidism 07/17/2020    Priority: Medium   COPD GOLD I  07/05/2017    Priority: Medium   Chronic obstructive lung disease ([place]) 05/23/2019    Priority: Low   History of DVT (deep vein thrombosis) 07/22/2016    Priority: Low   Past Surgical History:  Procedure Laterality Date   BREAST BIOPSY     COLONOSCOPY     KNEE ARTHROSCOPY Right 01/29/2015   meniscus repair Procedure: RIGHT KNEE ARTHROSCOPY WITH DEBRIDEMENT  ;  Surgeon: Gaynelle Arabian, MD;  Location: [facility];  Service: Orthopedics;  Laterality: Right;    THYROIDECTOMY, PARTIAL  1998    Family History  Problem Relation Age of Onset   Dementia Mother        lewy body dementia   Lung cancer Father    Prostate cancer Father        mets to bone   CVA Father        late 58s. smoker   Alcoholism Father    Alcoholism Paternal Grandfather    Colon cancer Neg Hx    Esophageal cancer Neg Hx    Rectal cancer Neg Hx    Stomach cancer Neg Hx     Medications- reviewed and updated Current Outpatient Medications  Medication Sig Dispense Refill   acetaminophen (TYLENOL) 325 MG tablet Take 650 mg by mouth every 6 (six) hours as needed.     Calcium Carbonate-Vitamin D (CALCIUM + D PO) Take 1 tablet by mouth daily.      Estradiol 0.75 MG/1.25 GM (0.06%) topical gel Place 1.25 g onto the skin daily.     Ibuprofen 200 MG CAPS Take by mouth. PRN     Multiple Vitamin (MULTIVITAMIN) tablet Take 1 tablet by mouth daily.     Multiple Vitamins-Minerals (PRESERVISION AREDS 2 PO) Take 1 tablet by mouth 2 (two) times daily.     PREVIDENT 5000 BOOSTER PLUS 1.1 % PSTE Place onto teeth.  PROAIR HFA 108 (90 Base) MCG/ACT inhaler      SYNTHROID 100 MCG tablet Take 1 tablet (100 mcg total) by mouth daily before breakfast. 90 tablet 3   No current facility-administered medications for this visit.    Allergies-reviewed and updated No Known Allergies  Social History   Social History Narrative   Married 45 years in 2022. 2 kids. 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  Oropharynx normal Neck:  stable thyroid nodule on right noted CV: RRR no murmurs rubs or gallops Lungs: CTAB no crackles, wheeze, rhonchi Abdomen: soft/nontender/nondistended/normal bowel sounds. No rebound or guarding.  Ext: no edema on left, trace on right. Equal 10 cm below tibial plateau. Minimal knee effusion on the left Skin: warm, dry Neuro: grossly normal, moves all extremities, PERRLA       Assessment and Plan  # Dyspnea- post covid plus COPD S: Patient was seen 02/16/2021 by Dr. Martinique of cardiology at the request of Dr. Jasmine December for an evaluation. She was seen by Dr. Radford Pax on the past in September 2020. Also followed by pulmonology.   Echo showed mild LVH. Coronary calcium scoring was 0. Chest CT showed emphysema. A Myoview study was considered but not done.  Patient did report having issues with DOE- worse when going up incline. She has also had COVID-19 and thought her symptoms started then - however her friend thought she had SOB prior to this. Did report having swelling in her right calve - venous dopplers were negative for DVT -  patient did have a history of remote DVT that followed post knee surgery with no recurrence. Denied having chest pain or palpitations.  No family history of CAD and no medical history of hypertension, hyperlipidemia, or diabetes mellitus. Patient was a former smoker - quit smoking 12 years ago.  Patient was recommended a cardiopulmonary stress test to further explain the cause of her dyspnea. Per DR. Martinique test was reassuring- thought to be more post covid symptoms- completed eval 03/09/21  Some COPD as well-follows with Dr. Valeta Harms A/P: thorough workup for SOB- appears to be post covid plus COPD combination- no further workup at this time- continue to follow with pulmonology- albuterol prn reasonable  #hypothyroidism- followed with Dr. Kelton Pillar previously S: compliant On thyroid medication- Synthroid 100 mcg once daily  (DAW) Lab Results   Component Value Date   TSH 0.40 07/14/2020   A/P:has been well controlled and on last imaging of thyroid nodule- no further follow up suggested. Apparently has been released by Dr. Kelton Pillar - we can resume care and I will send in rx and check tsh next visit    # Right leg swelling S:saw vein and vascular bak in [place] of [id number] after she developed swelling in her right lower extermity.  She does have a history of a procedure for venous insufficiency/varicose  veins on that side.  Veins were evaluated and thought to be of normal integrity.  She also had a DVT study which was negative for DVT on that side.  No clear trigger for why this started in January other than potentially prior procedure and poor vein flow.   She has compression stockings that wears on long car rides. If not having long ride, tries to elevate and move legs, get up and move around.   Prior Right sided after knee surgery- provoked DVT. Likely contributes to R>L edema   A/P: I suspect prior varicose vein on the right in addition to prior DVT on right side is contributing to right side swelling more-thankful DVT evaluation was negative and no worsening edema and no calf pain noted.  We will continue to monitor  #hyperlipidemia/aortic atherosclerosis S: Medication: None Lab Results  Component Value Date   CHOL 172 07/14/2020   HDL 81 07/14/2020   LDLCALC 75 07/14/2020   TRIG 75 07/14/2020   CHOLHDL 2.1 07/14/2020   A/P: We discussed with diagnosis of aortic atherosclerosis would prefer LDL under 70-discussed healthy eating/regular exercise and we can recheck at physical in 4 to 6 months   #Former smoker S:former smoker - follows with Dr. Valeta Harms for COPD- she get sshort of breath with activity. Sometimes feels guilty for htis  -quit smoking in 2008 but over 80 pack years A/P: Thrilled patient was able to quit smoking nearly 14 years ago-enrolled in lung cancer screening program for now.  Consider UA at  physical  #Family history of alcoholism S:alcohol 2-3 per day. Starting this summer.   A/P: Patient is aware of recommendations of 1 alcoholic beverage per day per women-we discussed potentially trying AA but she also think she can cut back on her own-we will certainly check in on this next visit  # Low Bone density (formerly osteopenia)- monitored by gynecology-very mild and right femoral neck -1.3.  Compliant with vitamin D-history of deficiency   Recommended follow up: No follow-ups on file. Future Appointments  Date Time Provider [place]  12/25/2021  9:40 AM Yong Channel, Brayton Mars, MD [facility]    Lab/Order associations:   ICD-10-CM   1. History of DVT (deep vein thrombosis)  Z86.718     2. Aortic atherosclerosis (HCC)  I70.0     3. Acquired hypothyroidism  E03.9     4. COPD GOLD I   J44.9       Meds ordered this encounter  Medications   SYNTHROID 100 MCG tablet    Sig: Take 1 tablet (100 mcg total) by mouth daily before breakfast.    Dispense:  90 tablet    Refill:  3    Requesting 1 year supply   I,Harris Phan,acting as a scribe for Garret Reddish, MD.,have documented all relevant documentation on the behalf of Garret Reddish, MD,as directed by  Garret Reddish, MD while in the presence of Garret Reddish, MD.  I, Garret Reddish, MD, have reviewed all documentation for this visit. The documentation on 06/18/21 for the exam, diagnosis, procedures, and orders are all accurate and complete.  Time Spent: 41 minutes of total time (1:50 PM- 2:31 PM) was spent on the date of the encounter performing the following actions: chart review prior to seeing the patient, obtaining history and updating epic,  performing a medically necessary exam, counseling on the treatment plan, placing orders, and documenting in our EHR.   Return precautions advised.  Garret Reddish, MD

## 2021-06-18 NOTE — Patient Instructions (Addendum)
In regards to your alcoholic beverage intake, you could try joining an AAA group from [employer] to help assist you.  Health Maintenance Due  Topic Date Due   Zoster Vaccines- Shingrix (1 of 2)  Please check with your pharmacy to see if they have the shingrix vaccine. If they do- please get this immunization and update [place] by phone call or mychart with dates you receive the vaccine  Never done   MAMMOGRAM   Sign release of information at the check out desk for last mammogram  03/15/2019   COVID-19 Vaccine (3 - Booster for Moderna series)  Either mychart [place] or bring to next visit your other 2 shots 06/16/2020   4-6 months for physical- can schedule at the desk

## 2021-09-14 ENCOUNTER — Other Ambulatory Visit: Payer: Self-pay | Admitting: *Deleted

## 2021-09-14 DIAGNOSIS — Z87891 Personal history of nicotine dependence: Secondary | ICD-10-CM

## 2021-09-16 ENCOUNTER — Inpatient Hospital Stay: Admission: RE | Admit: 2021-09-16 | Payer: Medicare Other | Source: Ambulatory Visit

## 2021-10-05 ENCOUNTER — Other Ambulatory Visit: Payer: Self-pay

## 2021-10-05 ENCOUNTER — Ambulatory Visit (INDEPENDENT_AMBULATORY_CARE_PROVIDER_SITE_OTHER)
Admission: RE | Admit: 2021-10-05 | Discharge: 2021-10-05 | Disposition: A | Discharge status: Home / Self Care | Payer: Medicare Other | Source: Ambulatory Visit | Attending: Acute Care | Admitting: Acute Care

## 2021-10-05 DIAGNOSIS — Z87891 Personal history of nicotine dependence: Secondary | ICD-10-CM

## 2021-10-08 ENCOUNTER — Ambulatory Visit (INDEPENDENT_AMBULATORY_CARE_PROVIDER_SITE_OTHER): Payer: Medicare Other

## 2021-10-08 ENCOUNTER — Other Ambulatory Visit: Payer: Self-pay

## 2021-10-08 DIAGNOSIS — Z23 Encounter for immunization: Secondary | ICD-10-CM | POA: Diagnosis not present

## 2021-10-12 ENCOUNTER — Other Ambulatory Visit: Payer: Self-pay | Admitting: Acute Care

## 2021-10-12 DIAGNOSIS — Z87891 Personal history of nicotine dependence: Secondary | ICD-10-CM

## 2021-10-20 ENCOUNTER — Other Ambulatory Visit: Payer: Self-pay

## 2021-10-20 ENCOUNTER — Ambulatory Visit (INDEPENDENT_AMBULATORY_CARE_PROVIDER_SITE_OTHER): Payer: Medicare Other

## 2021-10-20 DIAGNOSIS — Z0000 Encounter for general adult medical examination without abnormal findings: Secondary | ICD-10-CM | POA: Diagnosis not present

## 2021-10-20 DIAGNOSIS — N644 Mastodynia: Secondary | ICD-10-CM | POA: Diagnosis not present

## 2021-10-20 NOTE — Progress Notes (Signed)
Virtual Visit via Telephone Note  I connected with  Candace Frey on 10/20/21 at 11:00 AM EST by telephone and verified that I am speaking with the correct person using two identifiers.  Medicare Annual Wellness visit completed telephonically due to Covid-19 pandemic.   Persons participating in this call: This Health Coach and this patient.   Location: Patient: home Provider: office   I discussed the limitations, risks, security and privacy concerns of performing an evaluation and management service by telephone and the availability of in person appointments. The patient expressed understanding and agreed to proceed.  Unable to perform video visit due to video visit attempted and failed and/or patient does not have video capability.   Some vital signs may be absent or patient reported.   Willette Brace, LPN   Subjective:   Candace Frey is a 70 y.o. female who presents for Medicare Annual (Subsequent) preventive examination.  Review of Systems     Cardiac Risk Factors include: advanced age (>1men, >17 women)     Objective:    There were no vitals filed for this visit. There is no height or weight on file to calculate BMI.  Advanced Directives 10/20/2021 06/22/2019 01/30/2017 01/29/2015 01/23/2015 09/02/2014  Does Patient Have a Medical Advance Directive? Yes No No No No No  Does patient want to make changes to medical advance directive? Yes (MAU/Ambulatory/Procedural Areas - Information given) - - - - -  Would patient like information on creating a medical advance directive? - Yes (MAU/Ambulatory/Procedural Areas - Information given) No - Patient declined Yes - Educational materials given - -    Current Medications (verified) Outpatient Encounter Medications as of 10/20/2021  Medication Sig   acetaminophen (TYLENOL) 325 MG tablet Take 650 mg by mouth every 6 (six) hours as needed.   Calcium Carbonate-Vitamin D (CALCIUM + D PO) Take 1 tablet by mouth daily.     Estradiol 0.75 MG/1.25 GM (0.06%) topical gel Place 1.25 g onto the skin daily.   Ibuprofen 200 MG CAPS Take by mouth. PRN   Multiple Vitamin (MULTIVITAMIN) tablet Take 1 tablet by mouth daily.   Multiple Vitamins-Minerals (PRESERVISION AREDS 2 PO) Take 1 tablet by mouth 2 (two) times daily.   PROAIR HFA 108 (90 Base) MCG/ACT inhaler    SYNTHROID 100 MCG tablet Take 1 tablet (100 mcg total) by mouth daily before breakfast.   No facility-administered encounter medications on file as of 10/20/2021.    Allergies (verified) Patient has no known allergies.   History: Past Medical History:  Diagnosis Date   Acute deep vein thrombosis (DVT) of distal vein of right lower extremity (HCC) 07/22/2016   Acute medial meniscal tear 01/29/2015   Chicken pox    Chronic obstructive lung disease ([place]) 05/23/2019   Colon polyps    Complication of anesthesia    slow to wake up    COPD (chronic obstructive pulmonary disease) (HCC)    Genital warts    Multiple pulmonary nodules determined by computed tomography of lung 07/05/2017   CT 03/03/17  Right lower lobe nodularity described on the prior exam is less apparent today, favored to represent an area of scarring. other tiny nodules described on the prior are not readily apparent. No enlarging or dominant nodule identified > rec continue low dose annual  screening until 2023 per PCP (pt has fm hx lung ca)    Osteopenia    Shortness of breath dyspnea    with exertion was a smoker for many year  per patient    Thyroid disease    UTI (urinary tract infection)    Past Surgical History:  Procedure Laterality Date   BREAST BIOPSY     COLONOSCOPY     KNEE ARTHROSCOPY Right 01/29/2015   meniscus repair Procedure: RIGHT KNEE ARTHROSCOPY WITH DEBRIDEMENT  ;  Surgeon: Gaynelle Arabian, MD;  Location: [facility];  Service: Orthopedics;  Laterality: Right;   THYROIDECTOMY, PARTIAL  1998   Family History  Problem Relation Age of Onset   Dementia Mother        lewy body  dementia   Lung cancer Father    Prostate cancer Father        mets to bone   CVA Father        late 10s. smoker   Alcoholism Father    Alcoholism Paternal Grandfather    Colon cancer Neg Hx    Esophageal cancer Neg Hx    Rectal cancer Neg Hx    Stomach cancer Neg Hx    Social History   Socioeconomic History   Marital status: Married    Spouse name: Not on file   Number of children: 3   Years of education: Not on file   Highest education level: Not on file  Occupational History   Occupation: part-time  Tobacco Use   Smoking status: Former    Packs/day: 2.00    Years: 43.00    Pack years: 86.00    Types: Cigarettes    Quit date: 11/22/2006    Years since quitting: 14.9   Smokeless tobacco: Never  Vaping Use   Vaping Use: Never used  Substance and Sexual Activity   Alcohol use: Yes    Alcohol/week: 7.0 standard drinks    Types: 7 Glasses of wine per week    Comment: 2 glasses of wine per night   Drug use: No   Sexual activity: Yes  Other Topics Concern   Not on file  Social History Narrative   Married 45 years in 2022. 2 kids. 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      Hobbies: enjoys time with grandkids, people, gardening, theatre, music      Social Determinants of Health   Financial Resource Strain: Low Risk    Difficulty of Paying Living Expenses: Not hard at all  Food Insecurity: No Food Insecurity   Worried About Charity fundraiser in the Last Year: Never true   Arboriculturist in the Last Year: Never true  Transportation Needs: No Transportation Needs   Lack of Transportation (Medical): No   Lack of Transportation (Non-Medical): No  Physical Activity: Inactive   Days of Exercise per Week: 0 days   Minutes of Exercise per Session: 0 min  Stress: No Stress Concern  Present   Feeling of Stress : Not at all  Social Connections: Moderately Isolated   Frequency of Communication with Friends and Family: More than three times a week   Frequency of Social Gatherings with Friends and Family: More than three times a week   Attends Religious Services: Never   Marine scientist or Organizations: No   Attends Archivist Meetings: Never   Marital Status: Married    Tobacco Counseling Counseling  given: Not Answered   Clinical Intake:  Pre-visit preparation completed: Yes  Pain : No/denies pain     BMI - recorded: 25.61 Nutritional Status: BMI 25 -29 Overweight Nutritional Risks: Non-healing wound Diabetes: No  How often do you need to have someone help you when you read instructions, pamphlets, or other written materials from your doctor or pharmacy?: 1 - Never  Diabetic?no  Interpreter Needed?: No  Information entered by :: Charlott Rakes, LPN   Activities of Daily Living In your present state of health, do you have any difficulty performing the following activities: 10/20/2021  Hearing? N  Vision? N  Difficulty concentrating or making decisions? N  Walking or climbing stairs? N  Dressing or bathing? N  Doing errands, shopping? N  Preparing Food and eating ? N  Using the Toilet? N  In the past six months, have you accidently leaked urine? N  Do you have problems with loss of bowel control? N  Managing your Medications? N  Managing your Finances? N  Housekeeping or managing your Housekeeping? N  Some recent data might be hidden    Patient Care Team: Marin Olp, MD as PCP - General (Family Medicine) Pyrtle, Lajuan Lines, MD as Consulting Physician (Gastroenterology) Tanda Rockers, MD as Consulting Physician (Pulmonary Disease)  Indicate any recent Medical Services you may have received from other than [facility] providers in the past year (date may be approximate).     Assessment:   This is a routine wellness  examination for Candace Frey.  Hearing/Vision screen Hearing Screening - Comments:: Pt denies any hearing issues  Vision Screening - Comments:: Pt follows up with Dr Katy Fitch for annual eye exams   Dietary issues and exercise activities discussed: Current Exercise Habits: The patient does not participate in regular exercise at present   Goals Addressed             This Visit's Progress    Patient Stated       Stay active and lose weight       Depression Screen PHQ 2/9 Scores 10/20/2021 06/18/2021 06/22/2019  PHQ - 2 Score 0 0 0    Fall Risk Fall Risk  10/20/2021 06/18/2021 06/22/2019  Falls in the past year? 0 0 0  Number falls in past yr: 0 0 0  Injury with Fall? 0 0 -  Risk for fall due to : Impaired vision - -  Follow up Falls prevention discussed - -    FALL RISK PREVENTION PERTAINING TO THE HOME:  Any stairs in or around the home? Yes  If so, are there any without handrails? No  Home free of loose throw rugs in walkways, pet beds, electrical cords, etc? Yes  Adequate lighting in your home to reduce risk of falls? Yes   ASSISTIVE DEVICES UTILIZED TO PREVENT FALLS:  Life alert? No  Use of a cane, walker or w/c? No  Grab bars in the bathroom? Yes  Shower chair or bench in shower? Yes  Elevated toilet seat or a handicapped toilet? No   TIMED UP AND GO:  Was the test performed? No .   Cognitive Function:     6CIT Screen 10/20/2021  What Year? 0 points  What month? 0 points  What time? 0 points  Count back from 20 0 points  Months in reverse 0 points  Repeat phrase 0 points  Total Score 0    Immunizations Immunization History  Administered Date(s) Administered   Fluad Quad(high Dose 65+) 09/06/2020, 10/08/2021  Influenza,inj,Quad PF,6+ Mos 09/08/2019   Moderna SARS-COV2 Booster Vaccination 10/01/2020, 03/21/2021   Moderna Sars-Covid-2 Vaccination 12/21/2019, 01/18/2020   Pneumococcal Conjugate-13 07/14/2020   Pneumococcal Polysaccharide-23 07/01/2014,  06/14/2016   Tdap 02/21/2014    TDAP status: Up to date  Flu Vaccine status: Up to date  Pneumococcal vaccine status: Up to date  Covid-19 vaccine status: Completed vaccines  Qualifies for Shingles Vaccine? Yes   Zostavax completed No   Shingrix Completed?: No.    Education has been provided regarding the importance of this vaccine. Patient has been advised to call insurance company to determine out of pocket expense if they have not yet received this vaccine. Advised may also receive vaccine at local pharmacy or Health Dept. Verbalized acceptance and understanding.  Screening Tests Health Maintenance  Topic Date Due   Zoster Vaccines- Shingrix (1 of 2) Never done   COVID-19 Vaccine (3 - Booster for Moderna series) 05/16/2021   MAMMOGRAM  04/23/2023   TETANUS/TDAP  02/22/2024   COLONOSCOPY (Pts 45-64yrs Insurance coverage will need to be confirmed)  05/14/2027   Pneumonia Vaccine 35+ Years old  Completed   INFLUENZA VACCINE  Completed   DEXA SCAN  Completed   Hepatitis C Screening  Completed   HPV VACCINES  Aged Out    Health Maintenance  Health Maintenance Due  Topic Date Due   Zoster Vaccines- Shingrix (1 of 2) Never done   COVID-19 Vaccine (3 - Booster for Moderna series) 05/16/2021    Colorectal cancer screening: Type of screening: Colonoscopy. Completed 05/13/20. Repeat every 7 years  Mammogram status: Completed 04/2021. Repeat every year  Bone Density status: Completed 07/02/19. Results reflect: Bone density results: NORMAL. Repeat every 3-5 years.  Additional Screening:  Hepatitis C Screening:  Completed 07/14/20  Vision Screening: Recommended annual ophthalmology exams for early detection of glaucoma and other disorders of the eye. Is the patient up to date with their annual eye exam?  Yes  Who is the provider or what is the name of the office in which the patient attends annual eye exams? Dr Katy Fitch  If pt is not established with a provider, would they like to  be referred to a provider to establish care? No .   Dental Screening: Recommended annual dental exams for proper oral hygiene  Community Resource Referral / Chronic Care Management: CRR required this visit?  No   CCM required this visit?  No      Plan:     I have personally reviewed and noted the following in the patient's chart:   Medical and social history Use of alcohol, tobacco or illicit drugs  Current medications and supplements including opioid prescriptions.  Functional ability and status Nutritional status Physical activity Advanced directives List of other physicians Hospitalizations, surgeries, and ER visits in previous 12 months Vitals Screenings to include cognitive, depression, and falls Referrals and appointments  In addition, I have reviewed and discussed with patient certain preventive protocols, quality metrics, and best practice recommendations. A written personalized care plan for preventive services as well as general preventive health recommendations were provided to patient.     Willette Brace, LPN   [id number]   Nurse Notes: None

## 2021-10-20 NOTE — Patient Instructions (Signed)
Candace Frey , Thank you for taking time to come for your Medicare Wellness Visit. I appreciate your ongoing commitment to your health goals. Please review the following plan we discussed and let me know if I can assist you in the future.   Screening recommendations/referrals: Colonoscopy: Done 05/13/20 repeat every 7 years Mammogram: Done 04/2021 repeat every year Bone Density: Done 07/02/19 repeat every 2 years Recommended yearly ophthalmology/optometry visit for glaucoma screening and checkup Recommended yearly dental visit for hygiene and checkup  Vaccinations: Influenza vaccine: Done 10/08/21 repeat every 2 years  Pneumococcal vaccine: Up to date Tdap vaccine: Done 02/21/14 repeat every 10 years  Shingles vaccine: Shingrix discussed. Please contact your pharmacy for coverage information.    Covid-19:Completed 1/29, 2/26, 10/01/20 & 03/21/21  Advanced directives: Please bring a copy of your health care power of attorney and living will to the office at your convenience.  Conditions/risks identified: Stay active and lose weight   Next appointment: Follow up in one year for your annual wellness visit    Preventive Care 65 Years and Older, Female Preventive care refers to lifestyle choices and visits with your health care provider that can promote health and wellness. What does preventive care include? A yearly physical exam. This is also called an annual well check. Dental exams once or twice a year. Routine eye exams. Ask your health care provider how often you should have your eyes checked. Personal lifestyle choices, including: Daily care of your teeth and gums. Regular physical activity. Eating a healthy diet. Avoiding tobacco and drug use. Limiting alcohol use. Practicing safe sex. Taking low-dose aspirin every day. Taking vitamin and mineral supplements as recommended by your health care provider. What happens during an annual well check? The services and screenings done  by your health care provider during your annual well check will depend on your age, overall health, lifestyle risk factors, and family history of disease. Counseling  Your health care provider may ask you questions about your: Alcohol use. Tobacco use. Drug use. Emotional well-being. Home and relationship well-being. Sexual activity. Eating habits. History of falls. Memory and ability to understand (cognition). Work and work Statistician. Reproductive health. Screening  You may have the following tests or measurements: Height, weight, and BMI. Blood pressure. Lipid and cholesterol levels. These may be checked every 5 years, or more frequently if you are over 21 years old. Skin check. Lung cancer screening. You may have this screening every year starting at age 56 if you have a 30-pack-year history of smoking and currently smoke or have quit within the past 15 years. Fecal occult blood test (FOBT) of the stool. You may have this test every year starting at age 12. Flexible sigmoidoscopy or colonoscopy. You may have a sigmoidoscopy every 5 years or a colonoscopy every 10 years starting at age 75. Hepatitis C blood test. Hepatitis B blood test. Sexually transmitted disease (STD) testing. Diabetes screening. This is done by checking your blood sugar (glucose) after you have not eaten for a while (fasting). You may have this done every 1-3 years. Bone density scan. This is done to screen for osteoporosis. You may have this done starting at age 74. Mammogram. This may be done every 1-2 years. Talk to your health care provider about how often you should have regular mammograms. Talk with your health care provider about your test results, treatment options, and if necessary, the need for more tests. Vaccines  Your health care provider may recommend certain vaccines, such as: Influenza vaccine.  This is recommended every year. Tetanus, diphtheria, and acellular pertussis (Tdap, Td) vaccine. You  may need a Td booster every 10 years. Zoster vaccine. You may need this after age 51. Pneumococcal 13-valent conjugate (PCV13) vaccine. One dose is recommended after age 26. Pneumococcal polysaccharide (PPSV23) vaccine. One dose is recommended after age 76. Talk to your health care provider about which screenings and vaccines you need and how often you need them. This information is not intended to replace advice given to you by your health care provider. Make sure you discuss any questions you have with your health care provider. Document Released: 12/05/2015 Document Revised: 07/28/2016 Document Reviewed: 09/09/2015 Elsevier Interactive Patient Education  2017 [employer] Prevention in the Home Falls can cause injuries. They can happen to people of all ages. There are many things you can do to make your home safe and to help prevent falls. What can I do on the outside of my home? Regularly fix the edges of walkways and driveways and fix any cracks. Remove anything that might make you trip as you walk through a door, such as a raised step or threshold. Trim any bushes or trees on the path to your home. Use bright outdoor lighting. Clear any walking paths of anything that might make someone trip, such as rocks or tools. Regularly check to see if handrails are loose or broken. Make sure that both sides of any steps have handrails. Any raised decks and porches should have guardrails on the edges. Have any leaves, snow, or ice cleared regularly. Use sand or salt on walking paths during winter. Clean up any spills in your garage right away. This includes oil or grease spills. What can I do in the bathroom? Use night lights. Install grab bars by the toilet and in the tub and shower. Do not use towel bars as grab bars. Use non-skid mats or decals in the tub or shower. If you need to sit down in the shower, use a plastic, non-slip stool. Keep the floor dry. Clean up any water that spills  on the floor as soon as it happens. Remove soap buildup in the tub or shower regularly. Attach bath mats securely with double-sided non-slip rug tape. Do not have throw rugs and other things on the floor that can make you trip. What can I do in the bedroom? Use night lights. Make sure that you have a light by your bed that is easy to reach. Do not use any sheets or blankets that are too big for your bed. They should not hang down onto the floor. Have a firm chair that has side arms. You can use this for support while you get dressed. Do not have throw rugs and other things on the floor that can make you trip. What can I do in the kitchen? Clean up any spills right away. Avoid walking on wet floors. Keep items that you use a lot in easy-to-reach places. If you need to reach something above you, use a strong step stool that has a grab bar. Keep electrical cords out of the way. Do not use floor polish or wax that makes floors slippery. If you must use wax, use non-skid floor wax. Do not have throw rugs and other things on the floor that can make you trip. What can I do with my stairs? Do not leave any items on the stairs. Make sure that there are handrails on both sides of the stairs and use them. Fix handrails  that are broken or loose. Make sure that handrails are as long as the stairways. Check any carpeting to make sure that it is firmly attached to the stairs. Fix any carpet that is loose or worn. Avoid having throw rugs at the top or bottom of the stairs. If you do have throw rugs, attach them to the floor with carpet tape. Make sure that you have a light switch at the top of the stairs and the bottom of the stairs. If you do not have them, ask someone to add them for you. What else can I do to help prevent falls? Wear shoes that: Do not have high heels. Have rubber bottoms. Are comfortable and fit you well. Are closed at the toe. Do not wear sandals. If you use a stepladder: Make  sure that it is fully opened. Do not climb a closed stepladder. Make sure that both sides of the stepladder are locked into place. Ask someone to hold it for you, if possible. Clearly mark and make sure that you can see: Any grab bars or handrails. First and last steps. Where the edge of each step is. Use tools that help you move around (mobility aids) if they are needed. These include: Canes. Walkers. Scooters. Crutches. Turn on the lights when you go into a dark area. Replace any light bulbs as soon as they burn out. Set up your furniture so you have a clear path. Avoid moving your furniture around. If any of your floors are uneven, fix them. If there are any pets around you, be aware of where they are. Review your medicines with your doctor. Some medicines can make you feel dizzy. This can increase your chance of falling. Ask your doctor what other things that you can do to help prevent falls. This information is not intended to replace advice given to you by your health care provider. Make sure you discuss any questions you have with your health care provider. Document Released: 09/04/2009 Document Revised: 04/15/2016 Document Reviewed: 12/13/2014 Elsevier Interactive Patient Education  2017 [employer].

## 2021-12-18 NOTE — Progress Notes (Signed)
Phone [phone]   Subjective:  Patient presents today for their annual physical. Chief complaint-noted.   See problem oriented charting- Review of Systems  Constitutional:  Negative for chills and fever.  HENT:  Negative for congestion, hearing loss and sinus pain.   Eyes:  Negative for blurred vision and double vision.  Respiratory:  Positive for shortness of breath (stable). Negative for cough.   Cardiovascular:  Negative for chest pain and palpitations.  Gastrointestinal:  Negative for constipation, diarrhea, heartburn, nausea and vomiting.  Genitourinary:  Negative for dysuria and frequency.  Musculoskeletal:  Negative for back pain, myalgias and neck pain.  Skin:  Negative for itching and rash.  Neurological:  Negative for dizziness and headaches.  Endo/Heme/Allergies:  Negative for polydipsia. Does not bruise/bleed easily.  Psychiatric/Behavioral:  Negative for depression and suicidal ideas.    The following were reviewed and entered/updated in epic: Past Medical History:  Diagnosis Date   Acute deep vein thrombosis (DVT) of distal vein of right lower extremity (HCC) 07/22/2016   Acute medial meniscal tear 01/29/2015   Chicken pox    Chronic obstructive lung disease ([place]) 05/23/2019   Colon polyps    Complication of anesthesia    slow to wake up    COPD (chronic obstructive pulmonary disease) (HCC)    Genital warts    Multiple pulmonary nodules determined by computed tomography of lung 07/05/2017   CT 03/03/17  Right lower lobe nodularity described on the prior exam is less apparent today, favored to represent an area of scarring. other tiny nodules described on the prior are not readily apparent. No enlarging or dominant nodule identified > rec continue low dose annual  screening until 2023 per PCP (pt has fm hx lung ca)    Osteopenia    Shortness of breath dyspnea    with exertion was a smoker for many year per patient    Thyroid disease    UTI (urinary tract infection)     Patient Active Problem List   Diagnosis Date Noted   Aortic atherosclerosis ([place]) 06/18/2021    Priority: Medium    Acquired hypothyroidism 07/17/2020    Priority: Medium    COPD GOLD I  07/05/2017    Priority: Medium    Chronic obstructive lung disease ([place]) 05/23/2019    Priority: Low   History of DVT (deep vein thrombosis) 07/22/2016    Priority: Low   Past Surgical History:  Procedure Laterality Date   BREAST BIOPSY     COLONOSCOPY     KNEE ARTHROSCOPY Right 01/29/2015   meniscus repair Procedure: RIGHT KNEE ARTHROSCOPY WITH DEBRIDEMENT  ;  Surgeon: Gaynelle Arabian, MD;  Location: [facility];  Service: Orthopedics;  Laterality: Right;   THYROIDECTOMY, PARTIAL  1998    Family History  Problem Relation Age of Onset   Dementia Mother        lewy body dementia   Lung cancer Father    Prostate cancer Father        mets to bone   CVA Father        late 24s. smoker   Alcoholism Father    Alcoholism Paternal Grandfather    Colon cancer Neg Hx    Esophageal cancer Neg Hx    Rectal cancer Neg Hx    Stomach cancer Neg Hx     Medications- reviewed and updated Current Outpatient Medications  Medication Sig Dispense Refill   acetaminophen (TYLENOL) 325 MG tablet Take 650 mg by mouth every 6 (six) hours  as needed.     Calcium Carbonate-Vitamin D (CALCIUM + D PO) Take 1 tablet by mouth daily.      Estradiol 0.75 MG/1.25 GM (0.06%) topical gel Place 1.25 g onto the skin daily.     Ibuprofen 200 MG CAPS Take by mouth. PRN     Multiple Vitamin (MULTIVITAMIN) tablet Take 1 tablet by mouth daily.     Multiple Vitamins-Minerals (PRESERVISION AREDS 2 PO) Take 1 tablet by mouth 2 (two) times daily.     SYNTHROID 100 MCG tablet Take 1 tablet (100 mcg total) by mouth daily before breakfast. 90 tablet 3   No current facility-administered medications for this visit.    Allergies-reviewed and updated No Known Allergies  Social History   Social History Narrative   Married 45 years in  2022. 2 kids. 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 Oropharynx normal Neck: no thyromegaly CV: RRR no murmurs rubs or gallops Lungs: CTAB no crackles, wheeze, rhonchi Abdomen: soft/nontender/nondistended/normal bowel sounds. No rebound or guarding.  Ext: trace edema on right, minimal on left lower leg Skin: warm, dry Neuro: grossly normal, moves all extremities, PERRLA   Assessment and Plan   71 y.o. female presenting for annual physical.  Health Maintenance counseling: 1. Anticipatory guidance: Patient counseled regarding regular dental exams -q6 months, eye exams - yearly,  avoiding smoking and second hand smoke, limiting alcohol to 1 beverage per day- down from 2-3 per day prior- congratulated efforts! .  No illicit drugs  2. Risk factor reduction:  Advised patient of need for regular exercise and diet rich and fruits and vegetables to reduce risk of heart attack and stroke.  Exercise- off and on- on average over a month 5 days a week. Pilate's and dance has enjoyed in past- encouraged to restart one of these activities- she is leaning towards pilates  Diet/Management-weight largely stable over the last 3 years-doing protein heavier diet lately- .  Wt Readings from Last 3 Encounters:  12/25/21 173 lb 12.8 oz (78.8 kg)  06/18/21 173 lb 6.4 oz (78.7 kg)  02/16/21 172 lb (78 kg)  3. Immunizations/screenings/ancillary studies DISCUSSED:  -Shingrix vaccination #1- discussed at pharmacy for  now Immunization History  Administered Date(s) Administered   Fluad Quad(high Dose 65+) 09/06/2020, 10/08/2021   Influenza,inj,Quad PF,6+ Mos 09/08/2019   Moderna Covid-19 Vaccine Bivalent Booster 62yrs & up 07/06/2021   Moderna SARS-COV2 Booster Vaccination 10/01/2020, 03/21/2021   Moderna Sars-Covid-2 Vaccination 12/21/2019, 01/18/2020   Pneumococcal Conjugate-13 07/14/2020   Pneumococcal Polysaccharide-23 07/01/2014, 06/14/2016   Tdap 02/21/2014  4. Cervical cancer screening-pap smear with GYN-past age based screening recommendations but still follows with GYN 5. Breast cancer screening-  breast exam  with GYN and mammogram  04/22/21 with 1 year repeat planned 6. Colon cancer screening - colonoscopy 05/13/20  with 7 year repeat planned 7. Skin cancer screening- following with dermatology for discoloration in groin- had been with Dr. Martinique steroid cream was over 500- now seeing a new dermatologist. advised regular sunscreen use. Denies worrisome, changing, or new skin lesions.  8. Birth control/STD check- postmenopausal/monogamous 9. Osteoporosis screening at 30- DEXA 07/02/19 -see discussion below -Former smoker-see below   Status of chronic or acute concerns   # Dyspnea- post covid plus COPD S: Patient was started in early 2022 after having COVID.  Patient has seen pulmonology as well as cardiology.Echo showed mild LVH. Coronary calcium scoring was 0.  Did have some right leg swelling-She also had a DVT study which was negative for DVT on that side but does have history of provoked DVT on the right side which likely contributes to edema right greater than left. chest CT showed emphysema. A Myoview study was considered but not done. Patient was recommended a cardiopulmonary stress test to further explained the cause of her dyspnea. Per DR. Martinique test was reassuring- thought to be more post covid symptoms- completed eval 03/09/21 -Some COPD as well-follows with Dr. Maximino Greenland on controller  inhalers- albuterol prn reasonable  Unfortunately SOB has stuck with her over this time frame and has not recovered as much as she would like A/P: overall stable- likely combo of long covid plus underlying COPD- wants to hold off on albuterol for now- I am happy to refill if she changes her mind.  - no recent visits with Dr. Valeta Harms- she is considering if doesn't improve as increases exercise  #hypothyroidism- followed with Dr. Kelton Pillar previously S: compliant On thyroid medication- Synthroid 100 mcg once daily  (DAW) Lab Results  Component Value Date   TSH 0.40 07/14/2020  A/P:hopefully stable- update TSH today. Continue current meds for now   #hyperlipidemia/aortic atherosclerosis S: Medication: None Lab Results  Component Value Date   CHOL 172 07/14/2020   HDL 81 07/14/2020   LDLCALC 75 07/14/2020   TRIG 75 07/14/2020   CHOLHDL 2.1 07/14/2020   A/P: For aortic atherosclerosis-suspect stable LDL goal under 70  For hyperlipidemia-update lipid panel and consider further lifestyle change sor statin if LDL over 70   #Former smoker former smoker - Followed with Dr. Valeta Harms for COPD- she get short of breath with activity. Sometimes felt guilty for this  -quit smoking in 2008 but over 80 pack years -Enrolled in lung cancer screening program around the time.  We will plan on UA at physical     # Low Bone density (formerly osteopenia) #Vitamin D deficiency S:  monitored by gynecology in the past-very mild and right femoral neck -1.3.  Compliant with vitamin D-history of deficiency.  Last exam on file 07/02/2019 A/P: she will be seeing GYN later this year and discuss repeat dexa- for [place] we will check vitamin D for her today    #venous insufficiency-  history of vein ablation in 2018 with Dr. Jones Skene- was told needed another procedure a few years ago. Overall has been pretty stable since that time but leg still swells intermittently.  Has a paradoxical effect with compression stockings where  she actually tends to swell more- will focus on elevation and can refer to vein and vascular for 2nd opinion if she would like- no major current issues other than the swelling itself (no pain/discomfort)  #HRT- on estradiol right now- aware of risks  Recommended follow up: Return in about 6 months (around 06/24/2022) for follow up or sooner if needed. Future Appointments  Date Time Provider [place]  11/02/2022 11:00 AM [facility]   Lab/Order associations:NOT fasting   ICD-10-CM   1. Preventative health care  Z00.00     2. Hyperlipidemia, unspecified hyperlipidemia type  E78.5 CBC with Differential/Platelet    Comprehensive metabolic panel    Lipid panel    3. Aortic atherosclerosis (HCC)  I70.0     4. Acquired hypothyroidism  E03.9 TSH    5. Shortness of breath  R06.02     6. COPD mixed type (HCC) Chronic J44.9     7. Former smoker  Z87.891 POCT Urinalysis Dipstick (Automated)    8. Vitamin D deficiency  E55.9 VITAMIN D 25 Hydroxy (Vit-D Deficiency, Fractures)      No orders of the defined types were placed in this encounter.   I,Jada Bradford,acting as a scribe for Garret Reddish, MD.,have documented all relevant documentation on the behalf of Garret Reddish, MD,as directed by  Garret Reddish, MD while in the presence of Garret Reddish, MD.   I, Garret Reddish, MD, have reviewed all documentation for this visit. The documentation on 12/25/21 for the exam, diagnosis, procedures, and orders are all accurate and complete.  Return precautions advised.  Garret Reddish, MD

## 2021-12-18 NOTE — Patient Instructions (Addendum)
Health Maintenance Due  Topic Date Due   Zoster Vaccines- Shingrix (1 of 2) - Please consider getting your shingles shot at your local pharmacy-if received, please let us know.   Never done   please try elevating your legs when sitting and no long standing to help with blood flow.   I love that you are motivated to try piliates and dance once again!   Please stop by lab before you go If you have mychart- we will send your results within 3 business days of [place] receiving them.  If you do not have mychart- we will call you about results within 5 business days of [place] receiving them.  *please also note that you will see labs on mychart as soon as they post. I will later go in and write notes on them- will say "notes from Dr. Yong Channel"  Recommended follow up: Return in about 6 months (around 06/24/2022) for follow up or sooner if needed.

## 2021-12-25 ENCOUNTER — Encounter: Payer: Self-pay | Admitting: Family Medicine

## 2021-12-25 ENCOUNTER — Other Ambulatory Visit: Payer: Self-pay

## 2021-12-25 ENCOUNTER — Ambulatory Visit (INDEPENDENT_AMBULATORY_CARE_PROVIDER_SITE_OTHER): Payer: Medicare Other | Admitting: Family Medicine

## 2021-12-25 VITALS — BP 110/72 | HR 55 | Temp 97.3°F | Ht 69.0 in | Wt 173.8 lb

## 2021-12-25 DIAGNOSIS — E785 Hyperlipidemia, unspecified: Secondary | ICD-10-CM | POA: Diagnosis not present

## 2021-12-25 DIAGNOSIS — E039 Hypothyroidism, unspecified: Secondary | ICD-10-CM | POA: Diagnosis not present

## 2021-12-25 DIAGNOSIS — R0602 Shortness of breath: Secondary | ICD-10-CM

## 2021-12-25 DIAGNOSIS — I700 Atherosclerosis of aorta: Secondary | ICD-10-CM | POA: Diagnosis not present

## 2021-12-25 DIAGNOSIS — Z87891 Personal history of nicotine dependence: Secondary | ICD-10-CM | POA: Diagnosis not present

## 2021-12-25 DIAGNOSIS — Z0000 Encounter for general adult medical examination without abnormal findings: Secondary | ICD-10-CM | POA: Diagnosis not present

## 2021-12-25 DIAGNOSIS — J449 Chronic obstructive pulmonary disease, unspecified: Secondary | ICD-10-CM | POA: Diagnosis not present

## 2021-12-25 DIAGNOSIS — E559 Vitamin D deficiency, unspecified: Secondary | ICD-10-CM | POA: Diagnosis not present

## 2021-12-25 LAB — CBC WITH DIFFERENTIAL/PLATELET
Basophils Absolute: 0 10*3/uL (ref 0.0–0.1)
Basophils Relative: 0.5 % (ref 0.0–3.0)
Eosinophils Absolute: 0.1 10*3/uL (ref 0.0–0.7)
Eosinophils Relative: 1.1 % (ref 0.0–5.0)
HCT: 41.4 % (ref 36.0–46.0)
Hemoglobin: 13.6 g/dL (ref 12.0–15.0)
Lymphocytes Relative: 24.3 % (ref 12.0–46.0)
Lymphs Abs: 1.8 10*3/uL (ref 0.7–4.0)
MCHC: 32.8 g/dL (ref 30.0–36.0)
MCV: 96.9 fl (ref 78.0–100.0)
Monocytes Absolute: 0.8 10*3/uL (ref 0.1–1.0)
Monocytes Relative: 10.6 % (ref 3.0–12.0)
Neutro Abs: 4.8 10*3/uL (ref 1.4–7.7)
Neutrophils Relative %: 63.5 % (ref 43.0–77.0)
Platelets: 244 10*3/uL (ref 150.0–400.0)
RBC: 4.27 Mil/uL (ref 3.87–5.11)
RDW: 13.6 % (ref 11.5–15.5)
WBC: 7.6 10*3/uL (ref 4.0–10.5)

## 2021-12-25 LAB — POC URINALSYSI DIPSTICK (AUTOMATED)
Bilirubin, UA: NEGATIVE
Blood, UA: NEGATIVE
Glucose, UA: NEGATIVE
Ketones, UA: NEGATIVE
Nitrite, UA: NEGATIVE
Protein, UA: NEGATIVE
Spec Grav, UA: 1.01 (ref 1.010–1.025)
Urobilinogen, UA: 0.2 E.U./dL
pH, UA: 6 (ref 5.0–8.0)

## 2021-12-25 LAB — COMPREHENSIVE METABOLIC PANEL
ALT: 13 U/L (ref 0–35)
AST: 16 U/L (ref 0–37)
Albumin: 4.2 g/dL (ref 3.5–5.2)
Alkaline Phosphatase: 69 U/L (ref 39–117)
BUN: 15 mg/dL (ref 6–23)
CO2: 31 mEq/L (ref 19–32)
Calcium: 9.3 mg/dL (ref 8.4–10.5)
Chloride: 103 mEq/L (ref 96–112)
Creatinine, Ser: 0.74 mg/dL (ref 0.40–1.20)
GFR: 80.75 mL/min (ref >60.00)
Glucose, Bld: 91 mg/dL (ref 70–99)
Potassium: 4.3 mEq/L (ref 3.5–5.1)
Sodium: 140 mEq/L (ref 135–145)
Total Bilirubin: 0.5 mg/dL (ref 0.2–1.2)
Total Protein: 6.6 g/dL (ref 6.0–8.3)

## 2021-12-25 LAB — VITAMIN D 25 HYDROXY (VIT D DEFICIENCY, FRACTURES): VITD: 56.71 ng/mL (ref 30.00–100.00)

## 2021-12-25 LAB — LIPID PANEL
Cholesterol: 184 mg/dL (ref 0–200)
HDL: 80.5 mg/dL (ref >39.00)
LDL Cholesterol: 83 mg/dL (ref 0–99)
NonHDL: 103.5
Total CHOL/HDL Ratio: 2
Triglycerides: 103 mg/dL (ref 0.0–149.0)
VLDL: 20.6 mg/dL (ref 0.0–40.0)

## 2021-12-25 LAB — TSH: TSH: 0.93 u[IU]/mL (ref 0.35–5.50)

## 2022-02-03 NOTE — Progress Notes (Addendum)
? ?Phone [phone] ?In person visit ?  ?Subjective:  ? ?Candace Frey is a 70 y.o. year old very pleasant female patient who presents for/with See problem oriented charting ?Chief Complaint  ?Patient presents with  ? right foot pain  ?  Pt states pain started over the weekend with pain being mostly in her right heel. The whole foot is sensitive.  ? ? ?This visit occurred during the SARS-CoV-2 public health emergency.  Safety protocols were in place, including screening questions prior to the visit, additional usage of staff PPE, and extensive cleaning of exam room while observing appropriate contact time as indicated for disinfecting solutions.  ? ?Past Medical History-  ?Patient Active Problem List  ? Diagnosis Date Noted  ? Aortic atherosclerosis ([place]) 06/18/2021  ?  Priority: Medium   ? Acquired hypothyroidism 07/17/2020  ?  Priority: Medium   ? COPD GOLD I  07/05/2017  ?  Priority: Medium   ? Chronic obstructive lung disease ([place]) 05/23/2019  ?  Priority: Low  ? History of DVT (deep vein thrombosis) 07/22/2016  ?  Priority: Low  ? ? ?Medications- reviewed and updated ?Current Outpatient Medications  ?Medication Sig Dispense Refill  ? acetaminophen (TYLENOL) 325 MG tablet Take 650 mg by mouth every 6 (six) hours as needed.    ? Calcium Carbonate-Vitamin D (CALCIUM + D PO) Take 1 tablet by mouth daily.     ? Estradiol 0.75 MG/1.25 GM (0.06%) topical gel Place 1.25 g onto the skin daily.    ? Ibuprofen 200 MG CAPS Take by mouth. PRN    ? Multiple Vitamin (MULTIVITAMIN) tablet Take 1 tablet by mouth daily.    ? Multiple Vitamins-Minerals (PRESERVISION AREDS 2 PO) Take 1 tablet by mouth 2 (two) times daily.    ? SYNTHROID 100 MCG tablet Take 1 tablet (100 mcg total) by mouth daily before breakfast. 90 tablet 3  ? ?No current facility-administered medications for this visit.  ? ?  ?Objective:  ?BP 100/60   Pulse 75   Temp (!) 97.2 ?F (36.2 ?C)   Ht '5\' 9"'$  (1.753 m)   Wt 174 lb 3.2 oz (79 kg)   SpO2 98%    BMI 25.72 kg/m?  ?Gen: NAD, resting comfortably ?CV: RRR no murmurs rubs or gallops ?Lungs: CTAB no crackles, wheeze, rhonchi ?Ext: trace edema right >left- sock line strongly visible and only 9 am ?Skin: warm, dry ?Pain over plantar fascia on right foot- otherwise foot exam normal ?  ? ?Assessment and Plan  ? ?# Right heel pain  ?S:Over the weekend had some irritation over the heel but may have been going on longer- perhaps 2 weeks. Monday morning woke up and could not put her foot down due to pain in the heel. Walking has made pain worse. 7/10 at its worst on Monday morning. At lgeast 3/10 right now- this morning was 5/10- tried some stretches and has tried a spike therapy ball with some relief. No pain higher in leg or back pain. No fever or chills. No redness over the foot.  ?A/P: plantar fasciitis. Wants to hold off on nsaids. Home PT exercises given- see avs.  Sports medicine or podiatry referral if not improving ? ?# Venous insufficiency but with history of DVT in right ?S:Has seen Dr. Jones Skene in the past and had some procedures done on the leg. Swelling ongoing issue- worse on right and had DVT before  ?- has been worse over last 6 months. Compression stockings made swelling worse.  Tried to elevate legs. Has been a few months in particular where notes sock line even within a few hours of waking up ?A/P: will get stat DVT scan though I do not strongly suspect- mainly important to rule out. She would like to get vascular surgery opinion within [facility]- is going to check with one of her resources and get back to me on preference.   ? ?Recommended follow up: Return for next already scheduled visit or sooner if needed. ?Future Appointments  ?Date Time Provider [place]  ?06/28/2022 10:40 AM Marin Olp, MD [facility]  ?11/02/2022 11:00 AM [facility]  ? ?Lab/Order associations: ?  ICD-10-CM   ?1. Plantar fasciitis of right foot  M72.2   ?  ?2. Right leg swelling  M79.89 VAS  [place] LOWER EXTREMITY VENOUS (DVT)  ?  ?3. History of DVT (deep vein thrombosis)  Z86.718 VAS [place] LOWER EXTREMITY VENOUS (DVT)  ?  ? ?Time Spent: ?25 minutes of total time (8:42 Am- 9:07 AM) was spent on the date of the encounter performing the following actions: chart review prior to seeing the patient, obtaining history, performing a medically necessary exam, counseling on the treatment plan, placing orders, and documenting in our EHR.  ? ?I,Jada Bradford,acting as a scribe for Garret Reddish, MD.,have documented all relevant documentation on the behalf of Garret Reddish, MD,as directed by  Garret Reddish, MD while in the presence of Garret Reddish, MD. ? ?I, Garret Reddish, MD, have reviewed all documentation for this visit. The documentation on 02/04/22 for the exam, diagnosis, procedures, and orders are all accurate and complete. ? ?Return precautions advised.  ?Garret Reddish, MD ? ? ?

## 2022-02-04 ENCOUNTER — Encounter: Payer: Self-pay | Admitting: Family Medicine

## 2022-02-04 ENCOUNTER — Ambulatory Visit (HOSPITAL_COMMUNITY)
Admission: RE | Admit: 2022-02-04 | Discharge: 2022-02-04 | Disposition: A | Discharge status: Home / Self Care | Payer: Medicare Other | Source: Ambulatory Visit | Attending: Cardiology | Admitting: Cardiology

## 2022-02-04 ENCOUNTER — Ambulatory Visit (INDEPENDENT_AMBULATORY_CARE_PROVIDER_SITE_OTHER): Payer: Medicare Other | Admitting: Family Medicine

## 2022-02-04 ENCOUNTER — Other Ambulatory Visit: Payer: Self-pay

## 2022-02-04 VITALS — BP 100/60 | HR 75 | Temp 97.2°F | Ht 69.0 in | Wt 174.2 lb

## 2022-02-04 DIAGNOSIS — Z86718 Personal history of other venous thrombosis and embolism: Secondary | ICD-10-CM | POA: Diagnosis not present

## 2022-02-04 DIAGNOSIS — M722 Plantar fascial fibromatosis: Secondary | ICD-10-CM

## 2022-02-04 DIAGNOSIS — M79671 Pain in right foot: Secondary | ICD-10-CM

## 2022-02-04 DIAGNOSIS — E785 Hyperlipidemia, unspecified: Secondary | ICD-10-CM

## 2022-02-04 DIAGNOSIS — I700 Atherosclerosis of aorta: Secondary | ICD-10-CM

## 2022-02-04 DIAGNOSIS — M7989 Other specified soft tissue disorders: Secondary | ICD-10-CM | POA: Diagnosis not present

## 2022-02-04 NOTE — Patient Instructions (Addendum)
Handout for plantar fasciitis  ?I want you to do the exercise 3x a week for a month then once a week for another month at least. Stop any exercise that causes more than 1-2/10 pain increase. If not doing better within 1-2 months let us refer you to sports medicine or podiatry or sooner if you have worsening symptoms ? ?Setting up stat DVT scan ? ?Let me know who you want to see for vascular ? ?Recommended follow up: Return for next already scheduled visit or sooner if needed. In august ?

## 2022-06-14 ENCOUNTER — Other Ambulatory Visit: Payer: Self-pay | Admitting: Family Medicine

## 2022-06-28 ENCOUNTER — Ambulatory Visit (INDEPENDENT_AMBULATORY_CARE_PROVIDER_SITE_OTHER): Payer: Medicare Other | Admitting: Family Medicine

## 2022-06-28 ENCOUNTER — Encounter: Payer: Self-pay | Admitting: Family Medicine

## 2022-06-28 VITALS — BP 122/70 | HR 69 | Temp 98.2°F | Ht 69.0 in | Wt 172.0 lb

## 2022-06-28 DIAGNOSIS — E785 Hyperlipidemia, unspecified: Secondary | ICD-10-CM

## 2022-06-28 DIAGNOSIS — I700 Atherosclerosis of aorta: Secondary | ICD-10-CM

## 2022-06-28 DIAGNOSIS — J449 Chronic obstructive pulmonary disease, unspecified: Secondary | ICD-10-CM

## 2022-06-28 DIAGNOSIS — E039 Hypothyroidism, unspecified: Secondary | ICD-10-CM | POA: Diagnosis not present

## 2022-06-28 NOTE — Patient Instructions (Addendum)
Flu shot- we should have these available within a month or two but please let us know if you get at outside pharmacy - new covid shot also likely available in october  I like your idea of looking into silver sneakers  Recommended follow up: Return in about 6 months (around 12/29/2022) for physical or sooner if needed.Schedule b4 you leave.

## 2022-06-28 NOTE — Progress Notes (Signed)
Phone [phone] In person visit   Subjective:   Candace Frey is a 70 y.o. year old very pleasant female patient who presents for/with See problem oriented charting Chief Complaint  Patient presents with   Follow-up   Edema    Pt c/o still having bilateral leg edema.    Past Medical History-  Patient Active Problem List   Diagnosis Date Noted   Hyperlipidemia 06/28/2022    Priority: Medium    Aortic atherosclerosis ([place]) 06/18/2021    Priority: Medium    Acquired hypothyroidism 07/17/2020    Priority: Medium    COPD GOLD I  07/05/2017    Priority: Medium    Chronic obstructive lung disease ([place]) 05/23/2019    Priority: Low   History of DVT (deep vein thrombosis) 07/22/2016    Priority: Low    Medications- reviewed and updated Current Outpatient Medications  Medication Sig Dispense Refill   acetaminophen (TYLENOL) 325 MG tablet Take 650 mg by mouth every 6 (six) hours as needed.     Calcium Carbonate-Vitamin D (CALCIUM + D PO) Take 1 tablet by mouth daily.      Estradiol 0.75 MG/1.25 GM (0.06%) topical gel Place 1.25 g onto the skin daily.     Ibuprofen 200 MG CAPS Take by mouth. PRN     Multiple Vitamin (MULTIVITAMIN) tablet Take 1 tablet by mouth daily.     Multiple Vitamins-Minerals (PRESERVISION AREDS 2 PO) Take 1 tablet by mouth 2 (two) times daily.     SYNTHROID 100 MCG tablet TAKE 1 TABLET BY MOUTH  DAILY BEFORE BREAKFAST 90 tablet 3   No current facility-administered medications for this visit.     Objective:  BP 122/70   Pulse 69   Temp 98.2 F (36.8 C)   Ht '5\' 9"'$  (1.753 m)   Wt 172 lb (78 kg)   SpO2 95%   BMI 25.40 kg/m  Gen: NAD, resting comfortably CV: RRR no murmurs rubs or gallops Lungs: CTAB no crackles, wheeze, rhonchi Abdomen: soft/nontender/nondistended/normal bowel sounds. No rebound or guarding.  Ext: on right trace to 1+ edema, on left trace Skin: warm, dry    Assessment and Plan   #Plantar fasciitis- did well with home  exercises and is careful - doing well without recurrence.   #Venous insufficiency but with history of DVT on the right S: From last note "Has seen Dr. Jones Skene in the past and had some procedures done on the leg. Swelling ongoing issue- worse on right and had DVT before  - has been worse over last 6 months. Compression stockings made swelling worse. Tried to elevate legs. Has been a few months in particular where notes sock line even within a few hours of waking up A/P: will get stat DVT scan though I do not strongly suspect- mainly important to rule out. She would like to get vascular surgery opinion within [facility]- is going to check with one of her resources and get back to me on preference.  " - Prior ablation was in 2018.  Paradoxical effect with more swelling with compression A/P: overall stable - Thankfully DVT scan was negative last viist -In regard to potential referral- she has opted to hold off  - she will try to be active and elevate when she is seated   # COPD /former smoker- had seen Dr. Valeta Harms previously S: Patient reports stable shortness of breath and cough other than dyspnea worsened after COVID in 2022- no further worsening  Maintenance medications: prior trelegy rx  but opted to hold off- plans to hold off unless she worsens  Patient has albuterol but never had to use A/P: overall stable- continue to monitor- has albuterol on hand if needed- will schedule with Dr. Valeta Harms   #hypothyroidism-follows with Dr. Kelton Pillar S: compliant On thyroid medication-Synthroid 100 mcg once daily dispense as written Lab Results  Component Value Date   TSH 0.93 12/25/2021   A/P:  Controlled. Continue current medications.     #hyperlipidemia/aortic atherosclerosis S: Medication:none -active in yard and doing some painting- will look into silver sneakers Lab Results  Component Value Date   CHOL 184 12/25/2021   HDL 80.50 12/25/2021   LDLCALC 83 12/25/2021   TRIG 103.0 12/25/2021   CHOLHDL  2 12/25/2021   A/P: For aortic atherosclerosis prefer LDL under 70 - Suspect stable - update lipids next year- will continue efforts for healthy eating/regular exercise For hyperlipidemia mild elevation in LDL on last check   Recommended follow up: Return in about 6 months (around 12/29/2022) for physical or sooner if needed.Schedule b4 you leave. Future Appointments  Date Time Provider [place]  11/02/2022 11:00 AM [facility]    Lab/Order associations:   ICD-10-CM   1. COPD GOLD I   J44.9     2. Aortic atherosclerosis ([facility])  I70.0     3. Acquired hypothyroidism  E03.9     4. Hyperlipidemia, unspecified hyperlipidemia type  E78.5     5. Chronic obstructive pulmonary disease, unspecified COPD type ([place])  J44.9       No orders of the defined types were placed in this encounter.   Return precautions advised.  Garret Reddish, MD

## 2022-06-28 NOTE — Addendum Note (Signed)
Addended by: Marin Olp on: 06/28/2022 11:57 AM   Modules accepted: Orders

## 2022-07-05 ENCOUNTER — Encounter: Payer: Self-pay | Admitting: Family Medicine

## 2022-07-05 ENCOUNTER — Telehealth: Payer: Medicare Other | Admitting: Family Medicine

## 2022-07-05 ENCOUNTER — Telehealth: Payer: Self-pay | Admitting: Family Medicine

## 2022-07-05 VITALS — Temp 101.0°F | Ht 69.0 in | Wt 170.0 lb

## 2022-07-05 DIAGNOSIS — U071 COVID-19: Secondary | ICD-10-CM

## 2022-07-05 MED ORDER — MOLNUPIRAVIR EUA 200MG CAPSULE: 4.0000 | ORAL_CAPSULE | Freq: Two times a day (BID) | ORAL | 0 refills | Status: AC

## 2022-07-05 NOTE — Telephone Encounter (Signed)
Yes thanks can set up virtual at end of the day

## 2022-07-05 NOTE — Telephone Encounter (Signed)
Patient states she tested positive for COVID 8/13.  Patient states she only wants Dr. Ansel Bong advise.  Please advise on possible work in?

## 2022-07-05 NOTE — Telephone Encounter (Signed)
Patient scheduled.

## 2022-07-05 NOTE — Telephone Encounter (Signed)
See below

## 2022-07-05 NOTE — Progress Notes (Signed)
Phone [phone] Virtual visit via Video note   Subjective:  Chief complaint: Chief Complaint  Patient presents with   Covid Positive    Pt states she tested positive for covid yesterday morning, she has sore throat, headache and fever of 101 and went up to 102 last night. She was exposed by someone on Thursday.    This visit type was conducted due to national recommendations for restrictions regarding the COVID-19 Pandemic (e.g. social distancing).  This format is felt to be most appropriate for this patient at this time balancing risks to patient and risks to population by having him in for in person visit.  No physical exam was performed (except for noted visual exam or audio findings with Telehealth visits).    Our team/I connected with Esmay C Krahl at  4:00 PM EDT by a video enabled telemedicine application (doxy.me or caregility through epic) and verified that I am speaking with the correct person using two identifiers.  Location patient: [facility] Location provider: [facility], office Persons participating in the virtual visit:  patient  Our team/I discussed the limitations of evaluation and management by telemedicine and the availability of in person appointments. In light of current covid-19 pandemic, patient also understands that we are trying to protect them by minimizing in office contact if at all possible.  The patient expressed consent for telemedicine visit and agreed to proceed. Patient understands insurance will be billed.   Past Medical History-  Patient Active Problem List   Diagnosis Date Noted   Hyperlipidemia 06/28/2022    Priority: Medium    Aortic atherosclerosis ([place]) 06/18/2021    Priority: Medium    Acquired hypothyroidism 07/17/2020    Priority: Medium    COPD GOLD I  07/05/2017    Priority: Medium    History of DVT (deep vein thrombosis) 07/22/2016    Priority: Low    Medications- reviewed and updated Current Outpatient Medications   Medication Sig Dispense Refill   acetaminophen (TYLENOL) 325 MG tablet Take 650 mg by mouth every 6 (six) hours as needed.     Calcium Carbonate-Vitamin D (CALCIUM + D PO) Take 1 tablet by mouth daily.      Estradiol 0.75 MG/1.25 GM (0.06%) topical gel Place 1.25 g onto the skin daily.     Ibuprofen 200 MG CAPS Take by mouth. PRN     Multiple Vitamins-Minerals (PRESERVISION AREDS 2 PO) Take 1 tablet by mouth 2 (two) times daily.     SYNTHROID 100 MCG tablet TAKE 1 TABLET BY MOUTH  DAILY BEFORE BREAKFAST 90 tablet 3   molnupiravir EUA (LAGEVRIO) 200 mg CAPS capsule Take 4 capsules (800 mg total) by mouth 2 (two) times daily for 5 days. 40 capsule 0   No current facility-administered medications for this visit.     Objective:  Temp (!) 101 F (38.3 C)   Ht '5\' 9"'$  (1.753 m)   Wt 170 lb (77.1 kg)   SpO2 97%   BMI 25.10 kg/m  self reported vitals Gen: NAD, resting comfortably Lungs: nonlabored, normal respiratory rate - able to walk to her room during visit without difficulty Skin: appears dry, no obvious rash     Assessment and Plan   #COVID-19 S: Patient tested positive for COVID yesterday morning.  Complains of sore throat, headache, fever up to 101 today and as high as 102 last night.  Known exposure on Thursday to covid positive patient.  First day of symptoms Saturday- sore throat and headache.  Fair amount  of cough. Mild shortness of breath but common for colds for her. Went upstairs during visit and no substantial shortness of breath. No wheezing A/P: Patient with testing confirming covid 19 with first day of covid 19 symptoms 07/03/22 Vaccination status:was up to date other than wiating on updated covid 19 vaccination   Therefore: - recommended patient watch closely for shortness of breath or confusion or worsening symptoms and if those occur patient should contact us immediately or seek care in the emergency department -recommended patient consider purchasing pulse oximeter  and if levels 94% or below persistently- seek care at the hospital - Patient needs to self isolate  for at least 5 days since first symptom AND at least 24 hours fever free without fever reducing medications AND have improvement in respiratory symptoms . After 5 days can end self isolation but still needs to wear mask for additional 5 days.  -Patient should inform close contacts about exposure (anyone patient been around unmasked for more than 15 minutes)   If High risk for complications (she is with COPD and age)-we discussed outpatient therapeutic options including paxlovid (and risk of rebound), molnupiravir - patient opted for molnupiravir- does not have side effect with her estrogen like paxlovid. We went over potential side effects listed.   COPD appears stable without meds- continue to monitor  Recommended follow up: as needed if fails to improve Future Appointments  Date Time Provider [place]  11/02/2022 11:00 AM [facility]  01/11/2023 10:00 AM Marin Olp, MD [facility]   Lab/Order associations:   ICD-10-CM   1. COVID-19  U07.1       Meds ordered this encounter  Medications   molnupiravir EUA (LAGEVRIO) 200 mg CAPS capsule    Sig: Take 4 capsules (800 mg total) by mouth 2 (two) times daily for 5 days.    Dispense:  40 capsule    Refill:  0   Return precautions advised.  Garret Reddish, MD

## 2022-07-05 NOTE — Telephone Encounter (Signed)
[place] for virtual work in?

## 2022-08-16 ENCOUNTER — Encounter: Payer: Self-pay | Admitting: *Deleted

## 2022-08-20 ENCOUNTER — Emergency Department (HOSPITAL_BASED_OUTPATIENT_CLINIC_OR_DEPARTMENT_OTHER): Payer: Medicare Other

## 2022-08-20 ENCOUNTER — Other Ambulatory Visit (HOSPITAL_BASED_OUTPATIENT_CLINIC_OR_DEPARTMENT_OTHER): Payer: Self-pay

## 2022-08-20 ENCOUNTER — Encounter (HOSPITAL_BASED_OUTPATIENT_CLINIC_OR_DEPARTMENT_OTHER): Payer: Self-pay

## 2022-08-20 ENCOUNTER — Other Ambulatory Visit: Payer: Self-pay

## 2022-08-20 ENCOUNTER — Emergency Department (HOSPITAL_BASED_OUTPATIENT_CLINIC_OR_DEPARTMENT_OTHER)
Admission: EM | Admit: 2022-08-20 | Discharge: 2022-08-20 | Disposition: A | Discharge status: Home / Self Care | Payer: Medicare Other | Attending: Emergency Medicine | Admitting: Emergency Medicine

## 2022-08-20 DIAGNOSIS — M545 Low back pain, unspecified: Secondary | ICD-10-CM | POA: Diagnosis not present

## 2022-08-20 DIAGNOSIS — G8911 Acute pain due to trauma: Secondary | ICD-10-CM | POA: Insufficient documentation

## 2022-08-20 DIAGNOSIS — Y92009 Unspecified place in unspecified non-institutional (private) residence as the place of occurrence of the external cause: Secondary | ICD-10-CM | POA: Diagnosis not present

## 2022-08-20 DIAGNOSIS — R1031 Right lower quadrant pain: Secondary | ICD-10-CM | POA: Insufficient documentation

## 2022-08-20 DIAGNOSIS — X500XXA Overexertion from strenuous movement or load, initial encounter: Secondary | ICD-10-CM | POA: Insufficient documentation

## 2022-08-20 DIAGNOSIS — R109 Unspecified abdominal pain: Secondary | ICD-10-CM | POA: Diagnosis not present

## 2022-08-20 DIAGNOSIS — R319 Hematuria, unspecified: Secondary | ICD-10-CM | POA: Diagnosis not present

## 2022-08-20 DIAGNOSIS — M5459 Other low back pain: Secondary | ICD-10-CM | POA: Diagnosis not present

## 2022-08-20 DIAGNOSIS — I700 Atherosclerosis of aorta: Secondary | ICD-10-CM | POA: Diagnosis not present

## 2022-08-20 LAB — CBC WITH DIFFERENTIAL/PLATELET
Abs Immature Granulocytes: 0.01 10*3/uL (ref 0.00–0.07)
Basophils Absolute: 0 10*3/uL (ref 0.0–0.1)
Basophils Relative: 1 %
Eosinophils Absolute: 0.1 10*3/uL (ref 0.0–0.5)
Eosinophils Relative: 1 %
HCT: 41 % (ref 36.0–46.0)
Hemoglobin: 13.7 g/dL (ref 12.0–15.0)
Immature Granulocytes: 0 %
Lymphocytes Relative: 39 %
Lymphs Abs: 2.5 10*3/uL (ref 0.7–4.0)
MCH: 31.5 pg (ref 26.0–34.0)
MCHC: 33.4 g/dL (ref 30.0–36.0)
MCV: 94.3 fL (ref 80.0–100.0)
Monocytes Absolute: 0.6 10*3/uL (ref 0.1–1.0)
Monocytes Relative: 9 %
Neutro Abs: 3.1 10*3/uL (ref 1.7–7.7)
Neutrophils Relative %: 50 %
Platelets: 220 10*3/uL (ref 150–400)
RBC: 4.35 MIL/uL (ref 3.87–5.11)
RDW: 13.2 % (ref 11.5–15.5)
WBC: 6.3 10*3/uL (ref 4.0–10.5)
nRBC: 0 % (ref 0.0–0.2)

## 2022-08-20 LAB — COMPREHENSIVE METABOLIC PANEL
ALT: 14 U/L (ref 0–44)
AST: 16 U/L (ref 15–41)
Albumin: 4.3 g/dL (ref 3.5–5.0)
Alkaline Phosphatase: 62 U/L (ref 38–126)
Anion gap: 11 (ref 5–15)
BUN: 12 mg/dL (ref 8–23)
CO2: 26 mmol/L (ref 22–32)
Calcium: 9 mg/dL (ref 8.9–10.3)
Chloride: 102 mmol/L (ref 98–111)
Creatinine, Ser: 0.71 mg/dL (ref 0.44–1.00)
GFR, Estimated: >60 mL/min (ref >60)
Glucose, Bld: 95 mg/dL (ref 70–99)
Potassium: 4 mmol/L (ref 3.5–5.1)
Sodium: 139 mmol/L (ref 135–145)
Total Bilirubin: 0.6 mg/dL (ref 0.3–1.2)
Total Protein: 6.7 g/dL (ref 6.5–8.1)

## 2022-08-20 LAB — URINALYSIS, ROUTINE W REFLEX MICROSCOPIC
Bilirubin Urine: NEGATIVE
Glucose, UA: NEGATIVE mg/dL
Ketones, ur: NEGATIVE mg/dL
Leukocytes,Ua: NEGATIVE
Nitrite: NEGATIVE
Protein, ur: NEGATIVE mg/dL
Specific Gravity, Urine: 1.005 (ref 1.005–1.030)
pH: 5.5 (ref 5.0–8.0)

## 2022-08-20 MED ORDER — IOHEXOL 300 MG/ML  SOLN
100.0000 mL | Freq: Once | INTRAMUSCULAR | Status: AC | PRN
  Administered 2022-08-20: 100 mL via INTRAVENOUS

## 2022-08-20 NOTE — ED Notes (Signed)
Call to lab regarding urine results

## 2022-08-20 NOTE — ED Provider Notes (Signed)
[facility] EMERGENCY DEPT Provider Note   CSN: [id number] Arrival date & time: 08/20/22  1132     History  Chief Complaint  Patient presents with   Flank Pain    Candace Frey is a 71 y.o. female.  HPI 71 year old female presents with right low back pain and right-sided lower abdominal pain.  She was sent here by her OB/GYN with concern for kidney stone versus appendicitis.  She has been dealing with this pain for about 6 days.  Currently is more in her low back on the right side than in the abdomen.  No vomiting, diarrhea, constipation, urinary symptoms.  She saw her GYN this morning who did an ultrasound and a pelvic exam and stated it was not gynecologic.  No fevers.  She has been taking Tylenol which seems to help.  Sometimes certain positions make it worse but overall it seems to come and go without obvious cause.  Home Medications Prior to Admission medications   Medication Sig Start Date End Date Taking? Authorizing Provider  acetaminophen (TYLENOL) 325 MG tablet Take 650 mg by mouth every 6 (six) hours as needed.    [provider]  Calcium Carbonate-Vitamin D (CALCIUM + D PO) Take 1 tablet by mouth daily.     [provider]  Estradiol 0.75 MG/1.25 GM (0.06%) topical gel Place 1.25 g onto the skin daily.    [provider]  Ibuprofen 200 MG CAPS Take by mouth. PRN    [provider]  Multiple Vitamins-Minerals (PRESERVISION AREDS 2 PO) Take 1 tablet by mouth 2 (two) times daily.    [provider]  SYNTHROID 100 MCG tablet TAKE 1 TABLET BY MOUTH  DAILY BEFORE BREAKFAST 06/14/22   Marin Olp, MD      Allergies    Patient has no known allergies.    Review of Systems   Review of Systems  Constitutional:  Negative for fever.  Respiratory:  Negative for shortness of breath.   Cardiovascular:  Negative for chest pain.  Gastrointestinal:  Positive for abdominal pain. Negative for blood in stool,  constipation, diarrhea, nausea and vomiting.  Genitourinary:  Negative for dysuria.       No bowel/bladder incontinence  Musculoskeletal:  Positive for back pain.  Neurological:  Negative for weakness and numbness.    Physical Exam Updated Vital Signs BP (!) 143/70 (BP Location: Right Arm)   Pulse 66   Temp 98 F (36.7 C) (Oral)   Resp 18   SpO2 99%  Physical Exam Vitals and nursing note reviewed.  Constitutional:      General: She is not in acute distress.    Appearance: She is well-developed. She is not ill-appearing or diaphoretic.  HENT:     Head: Normocephalic and atraumatic.  Cardiovascular:     Rate and Rhythm: Normal rate and regular rhythm.     Heart sounds: Normal heart sounds.  Pulmonary:     Effort: Pulmonary effort is normal.     Breath sounds: Normal breath sounds.  Abdominal:     Palpations: Abdomen is soft.     Tenderness: There is no abdominal tenderness.     Comments: Patient states she is "sore" in the right lower quadrant but not tender  Musculoskeletal:     Thoracic back: No bony tenderness.     Lumbar back: Tenderness present. No bony tenderness.       Back:  Skin:    General: Skin is warm and dry.  Neurological:  Mental Status: She is alert.     Comments: 5/5 strength in both lower extremities.  Grossly normal sensation.     ED Results / Procedures / Treatments   Labs (all labs ordered are listed, but only abnormal results are displayed) Labs Reviewed  URINALYSIS, ROUTINE W REFLEX MICROSCOPIC - Abnormal; Notable for the following components:      Result Value   Color, Urine COLORLESS (*)    Hgb urine dipstick LARGE (*)    Bacteria, UA RARE (*)    All other components within normal limits  CBC WITH DIFFERENTIAL/PLATELET  COMPREHENSIVE METABOLIC PANEL    EKG None  Radiology CT ABDOMEN PELVIS W CONTRAST  Result Date: 08/20/2022 CLINICAL DATA:  Right lower quadrant abdominal pain. EXAM: CT ABDOMEN AND PELVIS WITH CONTRAST  TECHNIQUE: Multidetector CT imaging of the abdomen and pelvis was performed using the standard protocol following bolus administration of intravenous contrast. RADIATION DOSE REDUCTION: This exam was performed according to the departmental dose-optimization program which includes automated exposure control, adjustment of the mA and/or kV according to patient size and/or use of iterative reconstruction technique. CONTRAST:  13m OMNIPAQUE IOHEXOL 300 MG/ML  SOLN COMPARISON:  None Available. FINDINGS: Lower chest: No acute abnormality. Hepatobiliary: No gallstones or biliary dilatation is noted. Left hepatic cyst is noted. Pancreas: Unremarkable. No pancreatic ductal dilatation or surrounding inflammatory changes. Spleen: Normal in size without focal abnormality. Adrenals/Urinary Tract: Adrenal glands appear normal. No hydronephrosis or renal obstruction is noted. No renal or ureteral calculi are noted. Bilateral renal cysts are noted for which no further follow-up is required. Urinary bladder is decompressed. Stomach/Bowel: Stomach is within normal limits. Appendix appears normal. No evidence of bowel wall thickening, distention, or inflammatory changes. Vascular/Lymphatic: Aortic atherosclerosis. No enlarged abdominal or pelvic lymph nodes. Reproductive: Uterus and ovaries are not remarkable. 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 Other: No abdominal wall hernia or abnormality. No abdominopelvic ascites. Musculoskeletal: No acute or significant osseous findings. 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 No other acute abnormality seen in the abdomen or pelvis. Aortic Atherosclerosis (ICD10-I70.0). Electronically Signed   By: JMarijo ConceptionM.D.   On: 08/20/2022 16:14    Procedures Procedures    Medications Ordered in ED Medications  iohexol  (OMNIPAQUE) 300 MG/ML solution 100 mL (100 mLs Intravenous Contrast Given 08/20/22 1553)    ED Course/ Medical Decision Making/ A&P                           Medical Decision Making Amount and/or Complexity of Data Reviewed Independent Historian: spouse External Data Reviewed: notes. Labs: ordered.    Details: Normal WBC/Hb and normal electrolyte panel.  UA with blood but no UTI. Radiology: independent interpretation performed.    Details: No acute appendicitis or obvious hydronephrosis.   Spouse indicates patient has also been doing a lot of housework and lifting things and reaching for things that are different than her typical activities.  Perhaps she has a muscle strain in her low back.  The abdominal CT which was ordered is overall unremarkable and I personally viewed/interpret these images.  She already knows about the kidney cyst.  She has blood in her urine but of unclear etiology.  Will need to follow-up with PCP for this.  Labs are otherwise unremarkable.  Highly doubt acute spinal cord emergency.  At this point  have advised her to continue Tylenol and local care such as ice/heat and follow-up with PCP.        Final Clinical Impression(s) / ED Diagnoses Final diagnoses:  Acute right-sided low back pain without sciatica    Rx / DC Orders ED Discharge Orders     None         Sherwood Gambler, MD 08/20/22 1651

## 2022-08-20 NOTE — ED Notes (Signed)
Urine sent to lab, waiting for order

## 2022-08-20 NOTE — ED Notes (Signed)
Spoke with nurse who ordered CT & will wait for provider to assess pt since the order may change due to kidney stone vs appendicitis.

## 2022-08-20 NOTE — ED Triage Notes (Signed)
Pt presents from OBGYN's office with RLQ pain radiating to her Right lower back x4-5 days. OBGYN did an [place] and did not see anything wrong with her ovaries, sent her here to r/o kidney stone or possible appendicitis d/t blood in her urine

## 2022-08-20 NOTE — Discharge Instructions (Signed)
If you develop worsening, recurrent, or continued back pain, numbness or weakness in the legs, incontinence of your bowels or bladders, numbness of your buttocks, fever, abdominal pain, or any other new/concerning symptoms then return to the ER for evaluation.  

## 2022-08-25 ENCOUNTER — Telehealth: Payer: Self-pay | Admitting: *Deleted

## 2022-08-25 NOTE — Telephone Encounter (Signed)
     Patient  visit on 08/20/2022  at [facility] ed was for back pain   Have you been able to follow up with your primary care physician? The dr in ed was not very disconcerting .. A horrible experience but the dr was inattentive .  a specialist is working with her now, provided patient experience number to patient  The patient was able to obtain any needed medicine or equipment.  Are there diet recommendations that you are having difficulty following?  Patient expresses understanding of discharge instructions and education provided has no other needs at this time.    [place] [phone] [address] , [place] [address] Email : [email or web address]

## 2022-08-26 ENCOUNTER — Encounter: Payer: Self-pay | Admitting: Family Medicine

## 2022-08-26 ENCOUNTER — Ambulatory Visit: Payer: Medicare Other | Admitting: Family Medicine

## 2022-08-26 VITALS — BP 110/72 | HR 74 | Temp 98.0°F | Ht 69.0 in | Wt 172.4 lb

## 2022-08-26 DIAGNOSIS — R1031 Right lower quadrant pain: Secondary | ICD-10-CM

## 2022-08-26 DIAGNOSIS — R319 Hematuria, unspecified: Secondary | ICD-10-CM

## 2022-08-26 DIAGNOSIS — M5441 Lumbago with sciatica, right side: Secondary | ICD-10-CM | POA: Diagnosis not present

## 2022-08-26 LAB — POC URINALSYSI DIPSTICK (AUTOMATED)
Bilirubin, UA: NEGATIVE
Blood, UA: POSITIVE
Glucose, UA: NEGATIVE
Ketones, UA: NEGATIVE
Nitrite, UA: NEGATIVE
Protein, UA: NEGATIVE
Spec Grav, UA: 1.01 (ref 1.010–1.025)
Urobilinogen, UA: 0.2 E.U./dL
pH, UA: 6 (ref 5.0–8.0)

## 2022-08-26 MED ORDER — PREDNISONE 20 MG PO TABS: ORAL_TABLET | ORAL | 0 refills | Status: DC

## 2022-08-26 NOTE — Patient Instructions (Addendum)
I think this could be an irritated nerve root. Lets try prednisone starting tomorrow morning. Continue tylenol and heating pad.  -with prednisone I would hope for at least 50% improvement iwthin 10-14 days and if not at least that much improved- refer to either sports medicine   Could also have concurrent UTI- we will get urine culture. If there is blood on send out more accurate test- likely refer to Dr. Alinda Money for his expert opinion.   Recommended follow up: Return for as needed for new, worsening, persistent symptoms.

## 2022-08-26 NOTE — Progress Notes (Signed)
Phone [phone] In person visit   Subjective:   Candace Frey is a 71 y.o. year old very pleasant female patient who presents for/with See problem oriented charting Chief Complaint  Patient presents with   Follow-up    ED f/u low back pain.   Hematuria    Pt c/o blood in urine, going on for 10 days, no pain with urination. Pt has seen OBGYN for this and has had scans done.   Past Medical History-  Patient Active Problem List   Diagnosis Date Noted   Hyperlipidemia 06/28/2022    Priority: Medium    Aortic atherosclerosis ([place]) 06/18/2021    Priority: Medium    Acquired hypothyroidism 07/17/2020    Priority: Medium    COPD GOLD I  07/05/2017    Priority: Medium    History of DVT (deep vein thrombosis) 07/22/2016    Priority: Low    Medications- reviewed and updated Current Outpatient Medications  Medication Sig Dispense Refill   acetaminophen (TYLENOL) 325 MG tablet Take 650 mg by mouth every 6 (six) hours as needed.     Calcium Carbonate-Vitamin D (CALCIUM + D PO) Take 1 tablet by mouth daily.      Estradiol 0.75 MG/1.25 GM (0.06%) topical gel Place 1.25 g onto the skin daily.     Ibuprofen 200 MG CAPS Take by mouth. PRN     Multiple Vitamins-Minerals (PRESERVISION AREDS 2 PO) Take 1 tablet by mouth 2 (two) times daily.     predniSONE (DELTASONE) 20 MG tablet Take 2 pills for 3 days, 1 pill for 4 days 10 tablet 0   SYNTHROID 100 MCG tablet TAKE 1 TABLET BY MOUTH  DAILY BEFORE BREAKFAST 90 tablet 3   No current facility-administered medications for this visit.     Objective:  BP 110/72   Pulse 74   Temp 98 F (36.7 C)   Ht '5\' 9"'$  (1.753 m)   Wt 172 lb 6.4 oz (78.2 kg)   SpO2 96%   BMI 25.46 kg/m  Gen: NAD, resting comfortably CV: RRR no murmurs rubs or gallops Lungs: CTAB no crackles, wheeze, rhonchi Abdomen: soft/nontender except for mild to moderate pain in right lower quadrant with palpation/nondistended/normal bowel sounds. No rebound or guarding.   Ext: no edema Skin: warm, dry  Back - Normal skin, Spine with normal alignment and no deformity.  No tenderness to vertebral process palpation.  Paraspinous muscles are very tender on the right side and with spasm noted.  Negative Straight leg raise.  Neuro- no saddle anesthesia, 5/5 strength lower extremities    Assessment and Plan   #ED follow-up for right lower back pain and right lower abdominal pain S: Patient presented on 08/20/2022 with right low back pain and right-sided lower abdominal pain (no right leg pain at that time)-had been sent by her OB/GYN for concern for kidney stone versus appendicitis.  6 days of pain prior to that visit.  Tylenol Did help with pain some.   GYN exam prior to emergency department visit included ultrasound and pelvic exam with no clear gynecological cause. They did note blood in urine (she has not visibly seen this- no blood on UA in February).   She had a reassuring CBC and CMP.  Urinalysis did show blood in the urine but no obvious UTI per EDP- with that being said follow up microscopic exam showed no blood and rare bacteria and today she has leukocytes- will get urine culture as below.   Patient had been  doing a lot of housework and lifting and there was some concern this could be a muscular strain.  CT of the abdomen pelvis was unremarkable per EDP and they recommended outpatient follow-up with PCP and treatment with Tylenol as well as ice/heat  Today she reports, pain is in right low back and down into right lower abdomen and going down into the leg some (new since last evaluationj). Heating pad helps some. Pain on Thursday and Friday severe 7/10 pain. Today about 2-3/10 pain  but pain can vary. No certain position that worsens. Better with laying down.  A/P: Right low back pain and right lower abdominal pain now with pain into upper thigh concerning for potential radicular pain-we opted to trial prednisone to see if this will decrease pain.  Considered   muscle relaxant as a lot of tension/muscle spasm in right low back but we opted to hold off - Reassuring CT scan without kidney stone or other obvious cause of pain - Discussed if not at least 50% better within 10 to 14 days referring to sports medicine  - Also with right lower quadrant pain and urinalysis concerning for potential infection-get urine culture.  Possible blood and we will get urine microscopic but in the emergency room had possible blood and follow-up showed no blood - If she does have microscopic hematuria she would like to see Dr. Alinda Money if needed who sees her son    Recommended follow up: Return for as needed for new, worsening, persistent symptoms. Future Appointments  Date Time Provider [place]  10/05/2022 10:00 AM [facility] 1 [facility]  11/02/2022 11:00 AM [facility]  01/11/2023 10:00 AM Marin Olp, MD [facility]    Lab/Order associations:   ICD-10-CM   1. Acute right-sided low back pain with right-sided sciatica  M54.41     2. Right lower quadrant pain  R10.31     3. Hematuria, unspecified type  R31.9 Urine Culture    POCT Urinalysis Dipstick (Automated)    Urine Microscopic      Meds ordered this encounter  Medications   predniSONE (DELTASONE) 20 MG tablet    Sig: Take 2 pills for 3 days, 1 pill for 4 days    Dispense:  10 tablet    Refill:  0    Time Spent: 40 minutes of total time (1:45 PM-2:25 PM) was spent on the date of the encounter performing the following actions: chart review prior to seeing the patient, obtaining history, performing a medically necessary exam, counseling on the treatment plan as well as reviewing ED trip and CT findings and walking through potential work-up for hematuria, placing orders, and documenting in our EHR.   Return precautions advised.  Garret Reddish, MD

## 2022-08-31 ENCOUNTER — Other Ambulatory Visit: Payer: Self-pay

## 2022-09-01 ENCOUNTER — Other Ambulatory Visit: Payer: Medicare Other

## 2022-09-01 DIAGNOSIS — L819 Disorder of pigmentation, unspecified: Secondary | ICD-10-CM | POA: Diagnosis not present

## 2022-09-01 DIAGNOSIS — D1801 Hemangioma of skin and subcutaneous tissue: Secondary | ICD-10-CM | POA: Diagnosis not present

## 2022-09-01 DIAGNOSIS — L80 Vitiligo: Secondary | ICD-10-CM | POA: Diagnosis not present

## 2022-09-01 DIAGNOSIS — D2272 Melanocytic nevi of left lower limb, including hip: Secondary | ICD-10-CM | POA: Diagnosis not present

## 2022-09-01 DIAGNOSIS — R319 Hematuria, unspecified: Secondary | ICD-10-CM

## 2022-09-01 DIAGNOSIS — D2239 Melanocytic nevi of other parts of face: Secondary | ICD-10-CM | POA: Diagnosis not present

## 2022-09-01 DIAGNOSIS — L720 Epidermal cyst: Secondary | ICD-10-CM | POA: Diagnosis not present

## 2022-09-01 DIAGNOSIS — L821 Other seborrheic keratosis: Secondary | ICD-10-CM | POA: Diagnosis not present

## 2022-09-01 DIAGNOSIS — L738 Other specified follicular disorders: Secondary | ICD-10-CM | POA: Diagnosis not present

## 2022-09-02 LAB — URINE CULTURE
MICRO NUMBER:: 14037413
Result:: NO GROWTH
SPECIMEN QUALITY:: ADEQUATE

## 2022-09-03 ENCOUNTER — Other Ambulatory Visit: Payer: Self-pay

## 2022-09-03 DIAGNOSIS — R319 Hematuria, unspecified: Secondary | ICD-10-CM

## 2022-09-27 ENCOUNTER — Other Ambulatory Visit: Payer: Self-pay | Admitting: Acute Care

## 2022-09-27 DIAGNOSIS — Z87891 Personal history of nicotine dependence: Secondary | ICD-10-CM

## 2022-10-05 ENCOUNTER — Other Ambulatory Visit: Payer: Medicare Other

## 2022-10-22 ENCOUNTER — Telehealth: Payer: Self-pay | Admitting: Acute Care

## 2022-10-22 NOTE — Telephone Encounter (Signed)
Left voicemail message for pt to call back to reschedule lung screening CT.

## 2022-10-25 NOTE — Telephone Encounter (Signed)
Spoke with pt and rescheduled lung screening CT 11/30/22 3:40. Nothing further needed at this time.

## 2022-10-27 ENCOUNTER — Other Ambulatory Visit: Payer: Medicare Other

## 2022-10-29 ENCOUNTER — Telehealth: Payer: Self-pay | Admitting: Family Medicine

## 2022-10-29 NOTE — Telephone Encounter (Signed)
PT States: -Their family is dealing with a lot of stress right now and would like reschedule.  PT Requests: -Please call back in January   11/02/22 AWV has been cancelled

## 2022-11-30 ENCOUNTER — Ambulatory Visit
Admission: RE | Admit: 2022-11-30 | Discharge: 2022-11-30 | Disposition: A | Discharge status: Home / Self Care | Payer: Medicare Other | Source: Ambulatory Visit | Attending: Acute Care | Admitting: Acute Care

## 2022-11-30 DIAGNOSIS — Z87891 Personal history of nicotine dependence: Secondary | ICD-10-CM

## 2022-11-30 DIAGNOSIS — J432 Centrilobular emphysema: Secondary | ICD-10-CM | POA: Diagnosis not present

## 2022-11-30 DIAGNOSIS — N289 Disorder of kidney and ureter, unspecified: Secondary | ICD-10-CM | POA: Diagnosis not present

## 2022-11-30 DIAGNOSIS — K7689 Other specified diseases of liver: Secondary | ICD-10-CM | POA: Diagnosis not present

## 2022-12-06 DIAGNOSIS — L80 Vitiligo: Secondary | ICD-10-CM | POA: Diagnosis not present

## 2022-12-30 ENCOUNTER — Ambulatory Visit (INDEPENDENT_AMBULATORY_CARE_PROVIDER_SITE_OTHER): Payer: Medicare Other

## 2022-12-30 VITALS — Wt 172.0 lb

## 2022-12-30 DIAGNOSIS — Z0000 Encounter for general adult medical examination without abnormal findings: Secondary | ICD-10-CM | POA: Diagnosis not present

## 2022-12-30 NOTE — Patient Instructions (Signed)
[name] , Thank you for taking time to come for your Medicare Wellness Visit. I appreciate your ongoing commitment to your health goals. Please review the following plan we discussed and let me know if I can assist you in the future.   These are the goals we discussed:  Goals      Patient Stated     I want to get back to pilates and walk more routinely.     Patient Stated     Stay active and lose weight     Patient Stated     Lose weight and get in better shape         This is a list of the screening recommended for you and due dates:  Health Maintenance  Topic Date Due   Zoster (Shingles) Vaccine (1 of 2) Never done   COVID-19 Vaccine (6 - 2023-24 season) 07/23/2022   Mammogram  04/23/2023   Medicare Annual Wellness Visit  12/31/2023   DTaP/Tdap/Td vaccine (2 - Td or Tdap) 02/22/2024   Colon Cancer Screening  05/14/2027   Pneumonia Vaccine  Completed   Flu Shot  Completed   DEXA scan (bone density measurement)  Completed   Hepatitis C Screening: USPSTF Recommendation to screen - Ages 34-79 yo.  Completed   HPV Vaccine  Aged Out    Advanced directives: Please bring a copy of your health care power of attorney and living will to the office at your convenience.  Conditions/risks identified: lose weight and get in better shape   Next appointment: Follow up in one year for your annual wellness visit    Preventive Care 65 Years and Older, Female Preventive care refers to lifestyle choices and visits with your health care provider that can promote health and wellness. What does preventive care include? A yearly physical exam. This is also called an annual well check. Dental exams once or twice a year. Routine eye exams. Ask your health care provider how often you should have your eyes checked. Personal lifestyle choices, including: Daily care of your teeth and gums. Regular physical activity. Eating a healthy diet. Avoiding tobacco and drug use. Limiting alcohol  use. Practicing safe sex. Taking low-dose aspirin every day. Taking vitamin and mineral supplements as recommended by your health care provider. What happens during an annual well check? The services and screenings done by your health care provider during your annual well check will depend on your age, overall health, lifestyle risk factors, and family history of disease. Counseling  Your health care provider may ask you questions about your: Alcohol use. Tobacco use. Drug use. Emotional well-being. Home and relationship well-being. Sexual activity. Eating habits. History of falls. Memory and ability to understand (cognition). Work and work Statistician. Reproductive health. Screening  You may have the following tests or measurements: Height, weight, and BMI. Blood pressure. Lipid and cholesterol levels. These may be checked every 5 years, or more frequently if you are over 10 years old. Skin check. Lung cancer screening. You may have this screening every year starting at age 91 if you have a 30-pack-year history of smoking and currently smoke or have quit within the past 15 years. Fecal occult blood test (FOBT) of the stool. You may have this test every year starting at age 92. Flexible sigmoidoscopy or colonoscopy. You may have a sigmoidoscopy every 5 years or a colonoscopy every 10 years starting at age 52. Hepatitis C blood test. Hepatitis B blood test. Sexually transmitted disease (STD) testing. Diabetes screening.  This is done by checking your blood sugar (glucose) after you have not eaten for a while (fasting). You may have this done every 1-3 years. Bone density scan. This is done to screen for osteoporosis. You may have this done starting at age 35. Mammogram. This may be done every 1-2 years. Talk to your health care provider about how often you should have regular mammograms. Talk with your health care provider about your test results, treatment options, and if necessary,  the need for more tests. Vaccines  Your health care provider may recommend certain vaccines, such as: Influenza vaccine. This is recommended every year. Tetanus, diphtheria, and acellular pertussis (Tdap, Td) vaccine. You may need a Td booster every 10 years. Zoster vaccine. You may need this after age 38. Pneumococcal 13-valent conjugate (PCV13) vaccine. One dose is recommended after age 32. Pneumococcal polysaccharide (PPSV23) vaccine. One dose is recommended after age 65. Talk to your health care provider about which screenings and vaccines you need and how often you need them. This information is not intended to replace advice given to you by your health care provider. Make sure you discuss any questions you have with your health care provider. Document Released: 12/05/2015 Document Revised: 07/28/2016 Document Reviewed: 09/09/2015 Elsevier Interactive Patient Education  2017 [place] Prevention in the Home Falls can cause injuries. They can happen to people of all ages. There are many things you can do to make your home safe and to help prevent falls. What can I do on the outside of my home? Regularly fix the edges of walkways and driveways and fix any cracks. Remove anything that might make you trip as you walk through a door, such as a raised step or threshold. Trim any bushes or trees on the path to your home. Use bright outdoor lighting. Clear any walking paths of anything that might make someone trip, such as rocks or tools. Regularly check to see if handrails are loose or broken. Make sure that both sides of any steps have handrails. Any raised decks and porches should have guardrails on the edges. Have any leaves, snow, or ice cleared regularly. Use sand or salt on walking paths during winter. Clean up any spills in your garage right away. This includes oil or grease spills. What can I do in the bathroom? Use night lights. Install grab bars by the toilet and in the  tub and shower. Do not use towel bars as grab bars. Use non-skid mats or decals in the tub or shower. If you need to sit down in the shower, use a plastic, non-slip stool. Keep the floor dry. Clean up any water that spills on the floor as soon as it happens. Remove soap buildup in the tub or shower regularly. Attach bath mats securely with double-sided non-slip rug tape. Do not have throw rugs and other things on the floor that can make you trip. What can I do in the bedroom? Use night lights. Make sure that you have a light by your bed that is easy to reach. Do not use any sheets or blankets that are too big for your bed. They should not hang down onto the floor. Have a firm chair that has side arms. You can use this for support while you get dressed. Do not have throw rugs and other things on the floor that can make you trip. What can I do in the kitchen? Clean up any spills right away. Avoid walking on wet floors. Keep items  that you use a lot in easy-to-reach places. If you need to reach something above you, use a strong step stool that has a grab bar. Keep electrical cords out of the way. Do not use floor polish or wax that makes floors slippery. If you must use wax, use non-skid floor wax. Do not have throw rugs and other things on the floor that can make you trip. What can I do with my stairs? Do not leave any items on the stairs. Make sure that there are handrails on both sides of the stairs and use them. Fix handrails that are broken or loose. Make sure that handrails are as long as the stairways. Check any carpeting to make sure that it is firmly attached to the stairs. Fix any carpet that is loose or worn. Avoid having throw rugs at the top or bottom of the stairs. If you do have throw rugs, attach them to the floor with carpet tape. Make sure that you have a light switch at the top of the stairs and the bottom of the stairs. If you do not have them, ask someone to add them for  you. What else can I do to help prevent falls? Wear shoes that: Do not have high heels. Have rubber bottoms. Are comfortable and fit you well. Are closed at the toe. Do not wear sandals. If you use a stepladder: Make sure that it is fully opened. Do not climb a closed stepladder. Make sure that both sides of the stepladder are locked into place. Ask someone to hold it for you, if possible. Clearly mark and make sure that you can see: Any grab bars or handrails. First and last steps. Where the edge of each step is. Use tools that help you move around (mobility aids) if they are needed. These include: Canes. Walkers. Scooters. Crutches. Turn on the lights when you go into a dark area. Replace any light bulbs as soon as they burn out. Set up your furniture so you have a clear path. Avoid moving your furniture around. If any of your floors are uneven, fix them. If there are any pets around you, be aware of where they are. Review your medicines with your doctor. Some medicines can make you feel dizzy. This can increase your chance of falling. Ask your doctor what other things that you can do to help prevent falls. This information is not intended to replace advice given to you by your health care provider. Make sure you discuss any questions you have with your health care provider. Document Released: 09/04/2009 Document Revised: 04/15/2016 Document Reviewed: 12/13/2014 Elsevier Interactive Patient Education  2017 [employer].

## 2022-12-30 NOTE — Progress Notes (Signed)
I connected with  Candace Frey on 12/30/22 by a audio enabled telemedicine application and verified that I am speaking with the correct person using two identifiers.  Patient Location: Home  Provider Location: Office/Clinic  I discussed the limitations of evaluation and management by telemedicine. The patient expressed understanding and agreed to proceed.   Subjective:   Candace Frey is a 72 y.o. female who presents for Medicare Annual (Subsequent) preventive examination.  Review of Systems     Cardiac Risk Factors include: advanced age (>108mn, >>65women);dyslipidemia     Objective:    Today's Vitals   12/30/22 1428  Weight: 172 lb (78 kg)   Body mass index is 25.4 kg/m.     12/30/2022    2:37 PM 08/20/2022   12:29 PM 10/20/2021   11:09 AM 06/22/2019   10:28 AM 01/30/2017   11:42 PM 01/29/2015    9:08 AM 01/23/2015    8:59 AM  Advanced Directives  Does Patient Have a Medical Advance Directive? Yes No Yes No No No No  Type of AParamedicof ADavistonLiving will        Does patient want to make changes to medical advance directive?   Yes (MAU/Ambulatory/Procedural Areas - Information given)      Copy of [place] Chart? No - copy requested        Would patient like information on creating a medical advance directive?  No - Patient declined  Yes (MAU/Ambulatory/Procedural Areas - Information given) No - Patient declined Yes - Educational materials given     Current Medications (verified) Outpatient Encounter Medications as of 12/30/2022  Medication Sig   acetaminophen (TYLENOL) 325 MG tablet Take 650 mg by mouth every 6 (six) hours as needed.   Calcium Carbonate-Vitamin D (CALCIUM + D PO) Take 1 tablet by mouth daily.    Estradiol 0.75 MG/1.25 GM (0.06%) topical gel Place 1.25 g onto the skin daily.   Ibuprofen 200 MG CAPS Take by mouth. PRN   Multiple Vitamins-Minerals (PRESERVISION AREDS 2 PO) Take 1 tablet by mouth 2  (two) times daily.   SPIKEVAX syringe    SYNTHROID 100 MCG tablet TAKE 1 TABLET BY MOUTH  DAILY BEFORE BREAKFAST   [DISCONTINUED] predniSONE (DELTASONE) 20 MG tablet Take 2 pills for 3 days, 1 pill for 4 days   No facility-administered encounter medications on file as of 12/30/2022.    Allergies (verified) Patient has no known allergies.   History: Past Medical History:  Diagnosis Date   Acute deep vein thrombosis (DVT) of distal vein of right lower extremity (HCC) 07/22/2016   Acute medial meniscal tear 01/29/2015   Chicken pox    Chronic obstructive lung disease ([place] 05/23/2019   Colon polyps    Complication of anesthesia    slow to wake up    COPD (chronic obstructive pulmonary disease) (HCC)    Genital warts    Multiple pulmonary nodules determined by computed tomography of lung 07/05/2017   CT 03/03/17  Right lower lobe nodularity described on the prior exam is less apparent today, favored to represent an area of scarring. other tiny nodules described on the prior are not readily apparent. No enlarging or dominant nodule identified > rec continue low dose annual  screening until 2023 per PCP (pt has fm hx lung ca)    Osteopenia    Shortness of breath dyspnea    with exertion was a smoker for many year per patient  Thyroid disease    UTI (urinary tract infection)    Past Surgical History:  Procedure Laterality Date   BREAST BIOPSY     COLONOSCOPY     KNEE ARTHROSCOPY Right 01/29/2015   meniscus repair Procedure: RIGHT KNEE ARTHROSCOPY WITH DEBRIDEMENT  ;  Surgeon: Gaynelle Arabian, MD;  Location: [facility];  Service: Orthopedics;  Laterality: Right;   THYROIDECTOMY, PARTIAL  1998   Family History  Problem Relation Age of Onset   Dementia Mother        lewy body dementia   Lung cancer Father    Prostate cancer Father        mets to bone   CVA Father        late 32s. smoker   Alcoholism Father    Alcoholism Paternal Grandfather    Colon cancer Neg Hx    Esophageal cancer Neg  Hx    Rectal cancer Neg Hx    Stomach cancer Neg Hx    Social History   Socioeconomic History   Marital status: Married    Spouse name: Not on file   Number of children: 3   Years of education: Not on file   Highest education level: Not on file  Occupational History   Occupation: part-time  Tobacco Use   Smoking status: Former    Packs/day: 2.00    Years: 43.00    Total pack years: 86.00    Types: Cigarettes    Quit date: 11/22/2006    Years since quitting: 16.1   Smokeless tobacco: Never  Vaping Use   Vaping Use: Never used  Substance and Sexual Activity   Alcohol use: Yes    Alcohol/week: 7.0 standard drinks of alcohol    Types: 7 Glasses of wine per week    Comment: 2 glasses of wine per night   Drug use: No   Sexual activity: Yes  Other Topics Concern   Not on file  Social History Narrative   Married 45 years in 2022. 2 kids. 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      Hobbies: enjoys time with grandkids, people, gardening, theatre, music      Social Determinants of Health   Financial Resource Strain: Low Risk  (12/30/2022)   Overall Financial Resource Strain (CARDIA)    Difficulty of Paying Living Expenses: Not hard at all  Food Insecurity: No Food Insecurity (12/30/2022)   Hunger Vital Sign    Worried About Running Out of Food in the Last Year: Never true    [place] in the Last Year: Never true  Transportation Needs: No Transportation Needs (12/30/2022)   PRAPARE - Hydrologist (Medical): No    Lack of Transportation (Non-Medical): No  Physical Activity: Inactive (12/30/2022)   Exercise Vital Sign    Days of Exercise per Week: 0 days    Minutes of Exercise per Session: 0 min  Stress: No Stress Concern Present (12/30/2022)   [place]    Feeling of Stress : Not at all  Social Connections: Moderately Isolated (12/30/2022)   Social Connection and Isolation Panel [NHANES]    Frequency of Communication with Friends and Family: More than  three times a week    Frequency of Social Gatherings with Friends and Family: More than three times a week    Attends Religious Services: Never    Marine scientist or Organizations: No    Attends Music therapist: Never    Marital Status: Married    Tobacco Counseling Counseling given: Not Answered   Clinical Intake:  Pre-visit preparation completed: Yes  Pain : No/denies pain     BMI - recorded: 25.4 Nutritional Status: BMI 25 -29 Overweight Nutritional Risks: None Diabetes: No  How often do you need to have someone help you when you read instructions, pamphlets, or other written materials from your doctor or pharmacy?: 1 - Never  Diabetic?no  Interpreter Needed?: No  Information entered by :: Charlott Rakes, LPN   Activities of Daily Living    12/30/2022    2:38 PM  In your present state of health, do you have any difficulty performing the following activities:  Hearing? 0  Vision? 0  Difficulty concentrating or making decisions? 0  Walking or climbing stairs? 0  Dressing or bathing? 0  Doing errands, shopping? 0  Preparing Food and eating ? N  Using the Toilet? N  In the past six months, have you accidently leaked urine? N  Do you have problems with loss of bowel control? N  Managing your Medications? N  Managing your Finances? N  Housekeeping or managing your Housekeeping? N    Patient Care Team: Marin Olp, MD as PCP - General (Family Medicine) Pyrtle, Lajuan Lines, MD as Consulting Physician (Gastroenterology) Tanda Rockers, MD as Consulting Physician (Pulmonary Disease)  Indicate any recent Medical Services you may have received from other than [facility] providers in  the past year (date may be approximate).     Assessment:   This is a routine wellness examination for Candace Frey.  Hearing/Vision screen Hearing Screening - Comments:: Pt denies any hearing issues  Vision Screening - Comments:: Pt will follow up with Dr Katy Fitch for annual eye exams   Dietary issues and exercise activities discussed: Current Exercise Habits: The patient does not participate in regular exercise at present   Goals Addressed             This Visit's Progress    Patient Stated       Lose weight and get in better shape        Depression Screen    12/30/2022    2:36 PM 12/25/2021    9:38 AM 10/20/2021   11:08 AM 06/18/2021    1:04 PM 06/22/2019   11:17 AM  PHQ 2/9 Scores  PHQ - 2 Score 0 0 0 0 0  PHQ- 9 Score  0       Fall Risk    12/30/2022    2:38 PM 10/20/2021   11:10 AM 06/18/2021    1:03 PM 06/22/2019   11:17 AM  [place] in the past year? 0 0 0 0  Number falls in past yr: 0 0 0 0  Injury with Fall? 0 0 0   Risk for fall due to : Impaired vision Impaired vision    Follow up Falls prevention discussed Falls prevention discussed      FALL RISK PREVENTION PERTAINING TO THE HOME:  Any stairs in or around the home? Yes  If so, are there any without handrails? No  Home free of loose throw rugs in walkways, pet beds, electrical cords, etc? Yes  Adequate lighting in your home to reduce risk of falls? Yes   ASSISTIVE DEVICES UTILIZED TO PREVENT FALLS:  Life alert? No  Use of a cane, walker or w/c? No  Grab bars in the bathroom? No  Shower chair or bench in shower? No  Elevated toilet seat or a handicapped toilet? No   TIMED UP AND GO:  Was the test performed? No .  Cognitive Function:        12/30/2022    2:39 PM 10/20/2021   11:11 AM  6CIT Screen  What Year? 0 points 0 points  What month? 0 points 0 points  What time? 0 points 0 points  Count back from 20 0 points 0 points  Months in reverse 0 points 0 points  Repeat phrase 0 points 0  points  Total Score 0 points 0 points    Immunizations Immunization History  Administered Date(s) Administered   Fluad Quad(high Dose 65+) 09/06/2020, 10/08/2021   Influenza, High Dose Seasonal PF 08/18/2022   Influenza,inj,Quad PF,6+ Mos 09/08/2019   Moderna Covid-19 Vaccine Bivalent Booster 33yr & up 07/06/2021   Moderna SARS-COV2 Booster Vaccination 10/01/2020, 03/21/2021   Moderna Sars-Covid-2 Vaccination 12/21/2019, 01/18/2020   Pneumococcal Conjugate-13 07/14/2020   Pneumococcal Polysaccharide-23 07/01/2014, 06/14/2016   Tdap 02/21/2014    TDAP status: Up to date  Flu Vaccine status: Up to date  Pneumococcal vaccine status: Up to date  Covid-19 vaccine status: Completed vaccines  Qualifies for Shingles Vaccine? Yes   Zostavax completed No   Shingrix Completed?: No.    Education has been provided regarding the importance of this vaccine. Patient has been advised to call insurance company to determine out of pocket expense if they have not yet received this vaccine. Advised may also receive vaccine at local pharmacy or Health Dept. Verbalized acceptance and understanding.  Screening Tests Health Maintenance  Topic Date Due   Zoster Vaccines- Shingrix (1 of 2) Never done   COVID-19 Vaccine (6 - 2023-24 season) 07/23/2022   MAMMOGRAM  04/23/2023   Medicare Annual Wellness (AWV)  12/31/2023   DTaP/Tdap/Td (2 - Td or Tdap) 02/22/2024   COLONOSCOPY (Pts 45-443yrInsurance coverage will need to be confirmed)  05/14/2027   Pneumonia Vaccine 6561Years old  Completed   INFLUENZA VACCINE  Completed   DEXA SCAN  Completed   Hepatitis C Screening  Completed   HPV VACCINES  Aged Out    Health Maintenance  Health Maintenance Due  Topic Date Due   Zoster Vaccines- Shingrix (1 of 2) Never done   COVID-19 Vaccine (6 - 2023-24 season) 07/23/2022    Colorectal cancer screening: Type of screening: Colonoscopy. Completed 05/13/20. Repeat every 7 years  Mammogram status:  Completed 04/22/21. Repeat every year  Bone Density status: Completed 07/02/19. Results reflect: Bone density results: OSTEOPENIA. Repeat every 2 years.  Additional Screening:  Hepatitis C Screening:  Completed 07/14/20  Vision Screening: Recommended annual ophthalmology exams for early detection of glaucoma and other disorders of the eye. Is the patient up to date with their annual eye exam?  Yes  Who is the provider or what is the name of the office in which the patient attends annual eye exams? Dr GrKaty FitchIf pt is not established with a provider, would they like to be referred to a provider to establish care? No .   Dental Screening: Recommended annual dental exams for proper oral hygiene  Community Resource Referral / Chronic Care Management: CRR required this visit?  No  CCM required this visit?  No      Plan:     I have personally reviewed and noted the following in the patient's chart:   Medical and social history Use of alcohol, tobacco or illicit drugs  Current medications and supplements including opioid prescriptions. Patient is not currently taking opioid prescriptions. Functional ability and status Nutritional status Physical activity Advanced directives List of other physicians Hospitalizations, surgeries, and ER visits in previous 12 months Vitals Screenings to include cognitive, depression, and falls Referrals and appointments  In addition, I have reviewed and discussed with patient certain preventive protocols, quality metrics, and best practice recommendations. A written personalized care plan for preventive services as well as general preventive health recommendations were provided to patient.     Willette Brace, LPN   07/23/3684   Nurse Notes: none

## 2023-01-11 ENCOUNTER — Other Ambulatory Visit (INDEPENDENT_AMBULATORY_CARE_PROVIDER_SITE_OTHER): Payer: Medicare Other

## 2023-01-11 ENCOUNTER — Encounter: Payer: Self-pay | Admitting: Family Medicine

## 2023-01-11 ENCOUNTER — Ambulatory Visit (INDEPENDENT_AMBULATORY_CARE_PROVIDER_SITE_OTHER): Payer: Medicare Other | Admitting: Family Medicine

## 2023-01-11 VITALS — BP 126/78 | HR 61 | Temp 98.0°F | Ht 69.0 in | Wt 176.4 lb

## 2023-01-11 DIAGNOSIS — E039 Hypothyroidism, unspecified: Secondary | ICD-10-CM | POA: Diagnosis not present

## 2023-01-11 DIAGNOSIS — J449 Chronic obstructive pulmonary disease, unspecified: Secondary | ICD-10-CM

## 2023-01-11 DIAGNOSIS — I700 Atherosclerosis of aorta: Secondary | ICD-10-CM

## 2023-01-11 DIAGNOSIS — E785 Hyperlipidemia, unspecified: Secondary | ICD-10-CM

## 2023-01-11 DIAGNOSIS — Z0000 Encounter for general adult medical examination without abnormal findings: Secondary | ICD-10-CM

## 2023-01-11 DIAGNOSIS — Z87891 Personal history of nicotine dependence: Secondary | ICD-10-CM

## 2023-01-11 DIAGNOSIS — R319 Hematuria, unspecified: Secondary | ICD-10-CM

## 2023-01-11 LAB — COMPREHENSIVE METABOLIC PANEL
ALT: 14 U/L (ref 0–35)
AST: 15 U/L (ref 0–37)
Albumin: 4.4 g/dL (ref 3.5–5.2)
Alkaline Phosphatase: 74 U/L (ref 39–117)
BUN: 9 mg/dL (ref 6–23)
CO2: 27 mEq/L (ref 19–32)
Calcium: 9.3 mg/dL (ref 8.4–10.5)
Chloride: 100 mEq/L (ref 96–112)
Creatinine, Ser: 0.69 mg/dL (ref 0.40–1.20)
GFR: 85.98 mL/min (ref >60.00)
Glucose, Bld: 102 mg/dL — ABNORMAL HIGH (ref 70–99)
Potassium: 4.1 mEq/L (ref 3.5–5.1)
Sodium: 138 mEq/L (ref 135–145)
Total Bilirubin: 0.7 mg/dL (ref 0.2–1.2)
Total Protein: 7.3 g/dL (ref 6.0–8.3)

## 2023-01-11 LAB — CBC WITH DIFFERENTIAL/PLATELET
Basophils Absolute: 0 10*3/uL (ref 0.0–0.1)
Basophils Relative: 0.6 % (ref 0.0–3.0)
Eosinophils Absolute: 0 10*3/uL (ref 0.0–0.7)
Eosinophils Relative: 0.7 % (ref 0.0–5.0)
HCT: 44.2 % (ref 36.0–46.0)
Hemoglobin: 14.9 g/dL (ref 12.0–15.0)
Lymphocytes Relative: 41 % (ref 12.0–46.0)
Lymphs Abs: 2.3 10*3/uL (ref 0.7–4.0)
MCHC: 33.7 g/dL (ref 30.0–36.0)
MCV: 96.1 fl (ref 78.0–100.0)
Monocytes Absolute: 0.6 10*3/uL (ref 0.1–1.0)
Monocytes Relative: 10.5 % (ref 3.0–12.0)
Neutro Abs: 2.6 10*3/uL (ref 1.4–7.7)
Neutrophils Relative %: 47.2 % (ref 43.0–77.0)
Platelets: 246 10*3/uL (ref 150.0–400.0)
RBC: 4.6 Mil/uL (ref 3.87–5.11)
RDW: 13.2 % (ref 11.5–15.5)
WBC: 5.5 10*3/uL (ref 4.0–10.5)

## 2023-01-11 LAB — TSH: TSH: 0.36 u[IU]/mL (ref 0.35–5.50)

## 2023-01-11 LAB — LIPID PANEL
Cholesterol: 182 mg/dL (ref 0–200)
HDL: 87.1 mg/dL (ref >39.00)
LDL Cholesterol: 78 mg/dL (ref 0–99)
NonHDL: 94.48
Total CHOL/HDL Ratio: 2
Triglycerides: 83 mg/dL (ref 0.0–149.0)
VLDL: 16.6 mg/dL (ref 0.0–40.0)

## 2023-01-11 LAB — URINALYSIS, MICROSCOPIC ONLY

## 2023-01-11 MED ORDER — TRELEGY ELLIPTA 100-62.5-25 MCG/ACT IN AEPB: 1.0000 | INHALATION_SPRAY | Freq: Every day | RESPIRATORY_TRACT | 3 refills | Status: DC

## 2023-01-11 MED ORDER — LEVOTHYROXINE SODIUM 100 MCG PO TABS: 100.0000 ug | ORAL_TABLET | Freq: Every day | ORAL | 3 refills | Status: DC

## 2023-01-11 NOTE — Progress Notes (Signed)
Phone [phone]   Subjective:  Patient presents today for their annual physical. Chief complaint-noted.   See problem oriented charting- ROS- full  review of systems was completed and negative except for: more shortness of breath, aspiration concern- coughing after liquids, RLE swelling  The following were reviewed and entered/updated in epic: Past Medical History:  Diagnosis Date   Acute deep vein thrombosis (DVT) of distal vein of right lower extremity (HCC) 07/22/2016   Acute medial meniscal tear 01/29/2015   Chicken pox    Chronic obstructive lung disease ([place]) 05/23/2019   Colon polyps    Complication of anesthesia    slow to wake up    COPD (chronic obstructive pulmonary disease) (HCC)    Genital warts    Multiple pulmonary nodules determined by computed tomography of lung 07/05/2017   CT 03/03/17  Right lower lobe nodularity described on the prior exam is less apparent today, favored to represent an area of scarring. other tiny nodules described on the prior are not readily apparent. No enlarging or dominant nodule identified > rec continue low dose annual  screening until 2023 per PCP (pt has fm hx lung ca)    Osteopenia    Shortness of breath dyspnea    with exertion was a smoker for many year per patient    Thyroid disease    UTI (urinary tract infection)    Patient Active Problem List   Diagnosis Date Noted   Hyperlipidemia 06/28/2022    Priority: Medium    Aortic atherosclerosis ([place]) 06/18/2021    Priority: Medium    Acquired hypothyroidism 07/17/2020    Priority: Medium    COPD GOLD I  07/05/2017    Priority: Medium    History of DVT (deep vein thrombosis) 07/22/2016    Priority: Low   Past Surgical History:  Procedure Laterality Date   BREAST BIOPSY     COLONOSCOPY     KNEE ARTHROSCOPY Right 01/29/2015   meniscus repair Procedure: RIGHT KNEE ARTHROSCOPY WITH DEBRIDEMENT  ;  Surgeon: Gaynelle Arabian, MD;  Location: [facility];  Service: Orthopedics;  Laterality:  Right;   THYROIDECTOMY, PARTIAL  1998    Family History  Problem Relation Age of Onset   Dementia Mother        lewy body dementia   Lung cancer Father    Prostate cancer Father        mets to bone   CVA Father        late 106s. smoker   Alcoholism Father    Alcoholism Paternal Grandfather    Colon cancer Neg Hx    Esophageal cancer Neg Hx    Rectal cancer Neg Hx    Stomach cancer Neg Hx     Medications- reviewed and updated Current Outpatient Medications  Medication Sig Dispense Refill   acetaminophen (TYLENOL) 325 MG tablet Take 650 mg by mouth every 6 (six) hours as needed.     Calcium Carbonate-Vitamin D (CALCIUM + D PO) Take 1 tablet by mouth daily.      Estradiol 0.75 MG/1.25 GM (0.06%) topical gel Place 1.25 g onto the skin daily.     Ibuprofen 200 MG CAPS Take by mouth. PRN     Multiple Vitamins-Minerals (PRESERVISION AREDS 2 PO) Take 1 tablet by mouth 2 (two) times daily.     No current facility-administered medications for this visit.    Allergies-reviewed and updated No Known Allergies  Social History   Social History Narrative   Married 45 years in  2022. 2 kids. 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 Oropharynx normal Neck: no thyromegaly CV: RRR no murmurs rubs or gallops Lungs: CTAB no crackles, wheeze, rhonchi Abdomen: soft/nontender/nondistended/normal bowel sounds. No rebound or guarding.  Ext: bilateral trace edema R> L Skin: warm, dry Neuro: grossly normal, moves  all extremities, PERRLA   Assessment and Plan   72 y.o. female presenting for annual physical.  Health Maintenance counseling: 1. Anticipatory guidance: Patient counseled regarding regular dental exams -q6 months, eye exams - yearly,  avoiding smoking and second hand smoke , limiting alcohol to 1 beverage per day- social 1-2 glasses of wine- 7-10- just mildly above goal- encouraged to limit to 7 a week , no illicit drugs .   2. Risk factor reduction:  Advised patient of need for regular exercise and diet rich and fruits and vegetables to reduce risk of heart attack and stroke.  Exercise- down lately with home issues.  Diet/weight management-weight within 3 lbs of last physical- should be easier to continue stability once home issues resolved- I told her I was quite impressed she was able to maintain so close to last year- continue efforts for stability or mild weight loss.  Wt Readings from Last 3 Encounters:  01/11/23 176 lb 6.4 oz (80 kg)  12/30/22 172 lb (78 kg)  08/26/22 172 lb 6.4 oz (78.2 kg)  3. Immunizations/screenings/ancillary studies- shingirx- consider at pharmacy, RSV- may wait until next fall, covid 08/27/22 Immunization History  Administered Date(s) Administered   Covid-19, Mrna,Vaccine(Spikevax)42yr and older 08/27/2022   Fluad Quad(high Dose 65+) 09/06/2020, 10/08/2021   Influenza, High Dose Seasonal PF 08/18/2022   Influenza,inj,Quad PF,6+ Mos 09/08/2019   Moderna Covid-19 Vaccine Bivalent Booster 157yr& up 07/06/2021   Moderna SARS-COV2 Booster Vaccination 10/01/2020, 03/21/2021   Moderna Sars-Covid-2 Vaccination 12/21/2019, 01/18/2020   Pneumococcal Conjugate-13 07/14/2020   Pneumococcal Polysaccharide-23 07/01/2014, 06/14/2016   Tdap 02/21/2014  4. Cervical cancer screening- past age based screening recommendations- no vaginal bleeding or discharge- still sees GYN as well 5. Breast cancer screening-  breast exam - with GYN-  and mammogram - has had- we will get  records 6. Colon cancer screening - 05/13/20 with 7 year repeat planned 7. Skin cancer screening- [place]. [place]- discolored cream in groin working with Dr. JoMartiniqueadvised regular sunscreen use. Denies worrisome, changing, or new skin lesions- other than listed.  8. Birth control/STD check- postmenopausal/monogamous  9. Osteoporosis screening at [age]see below discussion 10. Smoking associated screening - former smoker- still in lung cancer screening program  Status of chronic or acute concerns   #social update- a ton going on last 4 months at home with sewer pipe disintegrated - has dragged out with multiple issues  # Shortness of breath in patient with COPD/former smoker S: Has seen Dr. IcValeta Harmsn the past. Ongoing  mild worsening. Also has not been exercising due to social issues above. Not much wheezing. Breathing worse with anxiety with social situation- worries about husband falling as well. Husband has noted more coughing.  A/P: worsening of symptoms of COPD- restart trelegy and refer back to Dr. Valeta Harms -no chest pain and had seen cardiology in 2022  # aspiration concern S:occasionally she swallows liquids and she chokes and occasionally hard to clear it. Mother had to have thickened liquids in the past. No issues with solids.  Issues are maybe once in 2 months but last episode ws very bothersome. Has started within 3 months -family has noted more throat clearing A/P: I wonder if issues are worsened by COPD being worse as she gets into coughing fits with possible aspiration- considered speech and language eval/barium swallow study but we wanted to first trial trelegy and she will let me know if not improving. Asked her to journal frequency of events   #R> LLE - ongoing issues since DVT_ scanned last year without recurrence and no worsening since that time  #hyperlipidemia #aortic atherosclerosis S: Medication:none  Lab Results  Component Value Date   CHOL 184 12/25/2021    HDL 80.50 12/25/2021   LDLCALC 83 12/25/2021   TRIG 103.0 12/25/2021   CHOLHDL 2 12/25/2021   A/P: update lipids- so close to goal I think working on diet and exercise may get her there- if over 100 on LDL consider weekly statin.   #hypothyroidism S: compliant On thyroid medication-synthroid 100 mcg DAW- very costly  Lab Results  Component Value Date   TSH 0.93 12/25/2021   A/P:will trial generic levothyroxine 100 mcg due to cost- I will need to order another thyroid test and have her back in 6 weeks but I cannot order today as I have a separate future tsh order- she will reach out to me in about 5 weeks and I can order   #renal cysts noted- but has known about for years. Urine 4 months ago was without blood. No further follow up other than annual checks  #Osteopenia -  monitored by gynecology in the past-very mild and right femoral neck -1.3.  Compliant with vitamin D-history of deficiency.  Last exam on file 07/02/2019 - we will get copy of updated as she htinks she has had this  Recommended follow up: Return in about 6 months (around 07/12/2023) for followup or sooner if needed.Schedule b4 you leave. Future Appointments  Date Time Provider [place]  01/05/2024  3:30 PM [facility]   Lab/Order associations: fasting   ICD-10-CM   1. Preventative health care  Z00.00     2. COPD GOLD I   J44.9     3. Hyperlipidemia, unspecified hyperlipidemia type  E78.5     4. Acquired hypothyroidism  E03.9     5. Former smoker  Z87.891       No orders of the defined types were placed in this encounter.   Return precautions advised.  Garret Reddish, MD

## 2023-01-11 NOTE — Patient Instructions (Addendum)
Sign release of information at the check out desk for last mammogram and have them send [place] the next one as well as have them send [place] bone density  worsening of symptoms of COPD- restart trelegy and refer back to Dr. Valeta Harms  We will either call you (or see alternate below) within two weeks about your referral to Dr. Valeta Harms . Our referral specialist will sometimes also send you a mychart link once referral is approved and then you will call the # listed on there (let us know if you do not see this within 2 weeks or have not received call)  I wonder if issues are worsened by COPD being worse as she gets into coughing fits with possible aspiration- considered speech and language eval/barium swallow study but we wanted to first trial trelegy and she will let me know if not improving. Asked her to journal frequency of events   will trial generic levothyroxine 100 mcg due to cost- I will need to order another thyroid test and have her back in 6 weeks but I cannot order today as I have a separate future tsh order- she will reach out to me in about 5 weeks and I can order   Please go to [facility]  - located [address] across the street from [place] - in the basement - Hours: 7:30-5:30 PM M-F. You do NOT need an appointment.    Recommended follow up: Return in about 6 months (around 07/12/2023) for followup or sooner if needed.Schedule b4 you leave.

## 2023-01-13 ENCOUNTER — Other Ambulatory Visit: Payer: Self-pay

## 2023-01-13 ENCOUNTER — Encounter: Payer: Self-pay | Admitting: Pulmonary Disease

## 2023-01-13 ENCOUNTER — Ambulatory Visit: Payer: Medicare Other | Admitting: Pulmonary Disease

## 2023-01-13 VITALS — BP 130/68 | HR 78 | Ht 69.0 in | Wt 173.6 lb

## 2023-01-13 DIAGNOSIS — E039 Hypothyroidism, unspecified: Secondary | ICD-10-CM

## 2023-01-13 DIAGNOSIS — Z87891 Personal history of nicotine dependence: Secondary | ICD-10-CM | POA: Diagnosis not present

## 2023-01-13 DIAGNOSIS — R942 Abnormal results of pulmonary function studies: Secondary | ICD-10-CM | POA: Diagnosis not present

## 2023-01-13 DIAGNOSIS — J449 Chronic obstructive pulmonary disease, unspecified: Secondary | ICD-10-CM

## 2023-01-13 DIAGNOSIS — J432 Centrilobular emphysema: Secondary | ICD-10-CM | POA: Diagnosis not present

## 2023-01-13 DIAGNOSIS — R739 Hyperglycemia, unspecified: Secondary | ICD-10-CM

## 2023-01-13 MED ORDER — STIOLTO RESPIMAT 2.5-2.5 MCG/ACT IN AERS: 2.0000 | INHALATION_SPRAY | Freq: Every day | RESPIRATORY_TRACT | 6 refills | Status: DC

## 2023-01-13 NOTE — Patient Instructions (Signed)
Thank you for visiting Dr. Valeta Harms at [facility]. Today we recommend the following:  Meds ordered this encounter  Medications   Tiotropium Bromide-Olodaterol (STIOLTO RESPIMAT) 2.5-2.5 MCG/ACT AERS    Sig: Inhale 2 puffs into the lungs daily.    Dispense:  4 g    Refill:  6   ONO on room air   Return in about 6 months (around 07/14/2023) for with APP or Dr. Valeta Harms.    Please do your part to reduce the spread of COVID-19.

## 2023-01-13 NOTE — Progress Notes (Signed)
Synopsis: Referred in October 2020 to establish care with new pulmonary provider former patient Dr. Melvyn Novas, PCP: Marin Olp, MD  Subjective:   PATIENT ID: Candace Frey GENDER: female DOB: Mar 13, 1951, MRN: [id number]  Chief Complaint  Patient presents with   Follow-up    F/up COPD    72 yo FM, COPD, h/o VTE, enrolled in LDCT screening program, most recent in August 2020, Lung RADS 2B. Orginally from [place], moved here to help family. Son lives here in [place]. She is a retired Pharmacist, hospital. Husband was in broadcast.  Patient was originally seen in our clinic in 2018 by Dr. Melvyn Novas.  At this time she has had progressive dyspnea on exertion.  She used to walk 5 to 6 miles a day.  At this point she is slowly come off of that.  She feels like over the past several months her dyspnea is worse but she also gets anxious when she is trying to exert herself.  She also has not been using Spiriva that she was prescribed back in 2018.  She does not have it at home but did not notice much difference after using it once or twice.  She does have an albuterol rescue inhaler that she also uses rarely.  Patient denies fevers, chills night sweats weight loss cough and no hemoptysis.  She does not have a daily sputum production.  Patient's father with a past medical history of lung cancer.  OV 02/13/2020: Patient here today for follow-up regarding COPD.  Since last office visit she has completed her 2 series of COVID-19 vaccinations.  Today she again however does have some dyspnea on exertion.  She does state that some of her symptoms have slowly been progressively getting worse over the past couple of months.  She was relatively inactive during Covid lockdown timeframes but now has become more active.  She does feel that her symptomatology has been changing.  We did review her pulmonary function test that she had in 2018.  She also has questions today regarding her lung nodule that was found on recent screening.  CT  scan follow-up planned for August 2021.  OV 01/13/2023: Here today for COPD follow-up.  From respiratory standpoint she is doing okay.  She stopped using Trelegy.  She only used it for short period but she does have some increased shortness of breath.  She completed a lung cancer screening CT in January which was a lung RADS 2.  No additional follow-up at this time just plan for routine annual exam.  She does have evidence of emphysema on her CT.  She had used Spiriva in the past as well.    Past Medical History:  Diagnosis Date   Acute deep vein thrombosis (DVT) of distal vein of right lower extremity (HCC) 07/22/2016   Acute medial meniscal tear 01/29/2015   Chicken pox    Chronic obstructive lung disease ([place]) 05/23/2019   Colon polyps    Complication of anesthesia    slow to wake up    COPD (chronic obstructive pulmonary disease) (HCC)    Genital warts    Multiple pulmonary nodules determined by computed tomography of lung 07/05/2017   CT 03/03/17  Right lower lobe nodularity described on the prior exam is less apparent today, favored to represent an area of scarring. other tiny nodules described on the prior are not readily apparent. No enlarging or dominant nodule identified > rec continue low dose annual  screening until 2023 per PCP (pt has fm  hx lung ca)    Osteopenia    Shortness of breath dyspnea    with exertion was a smoker for many year per patient    Thyroid disease    UTI (urinary tract infection)      Family History  Problem Relation Age of Onset   Dementia Mother        lewy body dementia   Lung cancer Father    Prostate cancer Father        mets to bone   CVA Father        late 62s. smoker   Alcoholism Father    Alcoholism Paternal Grandfather    Colon cancer Neg Hx    Esophageal cancer Neg Hx    Rectal cancer Neg Hx    Stomach cancer Neg Hx      Past Surgical History:  Procedure Laterality Date   BREAST BIOPSY     COLONOSCOPY     KNEE ARTHROSCOPY Right  01/29/2015   meniscus repair Procedure: RIGHT KNEE ARTHROSCOPY WITH DEBRIDEMENT  ;  Surgeon: Gaynelle Arabian, MD;  Location: [facility];  Service: Orthopedics;  Laterality: Right;   THYROIDECTOMY, PARTIAL  1998    Social History   Socioeconomic History   Marital status: Married    Spouse name: Not on file   Number of children: 3   Years of education: Not on file   Highest education level: Not on file  Occupational History   Occupation: part-time  Tobacco Use   Smoking status: Former    Packs/day: 2.00    Years: 43.00    Total pack years: 86.00    Types: Cigarettes    Quit date: 11/22/2006    Years since quitting: 16.1   Smokeless tobacco: Never  Vaping Use   Vaping Use: Never used  Substance and Sexual Activity   Alcohol use: Yes    Alcohol/week: 7.0 standard drinks of alcohol    Types: 7 Glasses of wine per week    Comment: 2 glasses of wine per night   Drug use: No   Sexual activity: Yes  Other Topics Concern   Not on file  Social History Narrative   Married 45 years in 2022. 2 kids. 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 with lewy body dementia      Hobbies: enjoys time with grandkids, people, gardening, theatre, music      Social Determinants of Health   Financial Resource Strain: Low Risk  (12/30/2022)   Overall Financial Resource Strain (CARDIA)    Difficulty of Paying Living Expenses: Not hard at all  Food Insecurity: No Food Insecurity (12/30/2022)   Hunger Vital Sign    Worried About Running Out of Food in the Last Year: Never true    [place] in the Last Year: Never true  Transportation Needs: No Transportation Needs (12/30/2022)   PRAPARE - Hydrologist (Medical): No    Lack of Transportation (Non-Medical): No  Physical Activity: Inactive (12/30/2022)   Exercise  Vital Sign    Days of Exercise per Week: 0 days    Minutes of Exercise per Session: 0 min  Stress: No Stress Concern Present (12/30/2022)   [place]  Feeling of Stress : Not at all  Social Connections: Moderately Isolated (12/30/2022)   Social Connection and Isolation Panel [NHANES]    Frequency of Communication with Friends and Family: More than three times a week    Frequency of Social Gatherings with Friends and Family: More than three times a week    Attends Religious Services: Never    Marine scientist or Organizations: No    Attends Archivist Meetings: Never    Marital Status: Married  Human resources officer Violence: Not At Risk (12/30/2022)   Humiliation, Afraid, Rape, and Kick questionnaire    Fear of Current or Ex-Partner: No    Emotionally Abused: No    Physically Abused: No    Sexually Abused: No     No Known Allergies   Outpatient Medications Prior to Visit  Medication Sig Dispense Refill   acetaminophen (TYLENOL) 325 MG tablet Take 650 mg by mouth every 6 (six) hours as needed.     Calcium Carbonate-Vitamin D (CALCIUM + D PO) Take 1 tablet by mouth daily.      Estradiol 0.75 MG/1.25 GM (0.06%) topical gel Place 1.25 g onto the skin daily.     Fluticasone-Umeclidin-Vilant (TRELEGY ELLIPTA) 100-62.5-25 MCG/ACT AEPB Inhale 1 puff into the lungs daily. 90 each 3   levothyroxine (SYNTHROID) 100 MCG tablet Take 1 tablet (100 mcg total) by mouth daily. 90 tablet 3   Multiple Vitamins-Minerals (PRESERVISION AREDS 2 PO) Take 1 tablet by mouth 2 (two) times daily.     No facility-administered medications prior to visit.    Review of Systems  Constitutional:  Negative for chills, fever, malaise/fatigue and weight loss.  HENT:  Negative for hearing loss, sore throat and tinnitus.   Eyes:  Negative for blurred vision and double vision.  Respiratory:  Positive for shortness of breath. Negative for  cough, hemoptysis, sputum production, wheezing and stridor.   Cardiovascular:  Negative for chest pain, palpitations, orthopnea, leg swelling and PND.  Gastrointestinal:  Negative for abdominal pain, constipation, diarrhea, heartburn, nausea and vomiting.  Genitourinary:  Negative for dysuria, hematuria and urgency.  Musculoskeletal:  Negative for joint pain and myalgias.  Skin:  Negative for itching and rash.  Neurological:  Negative for dizziness, tingling, weakness and headaches.  Endo/Heme/Allergies:  Negative for environmental allergies. Does not bruise/bleed easily.  Psychiatric/Behavioral:  Negative for depression. The patient is not nervous/anxious and does not have insomnia.   All other systems reviewed and are negative.    Objective:  Physical Exam Vitals reviewed.  Constitutional:      General: She is not in acute distress.    Appearance: She is well-developed.  HENT:     Head: Normocephalic and atraumatic.  Eyes:     General: No scleral icterus.    Conjunctiva/sclera: Conjunctivae normal.     Pupils: Pupils are equal, round, and reactive to light.  Neck:     Vascular: No JVD.     Trachea: No tracheal deviation.  Cardiovascular:     Rate and Rhythm: Normal rate and regular rhythm.     Heart sounds: Normal heart sounds. No murmur heard. Pulmonary:     Effort: Pulmonary effort is normal. No tachypnea, accessory muscle usage or respiratory distress.     Breath sounds: No stridor. No wheezing, rhonchi or rales.  Abdominal:     General: There is no distension.     Palpations: Abdomen is soft.     Tenderness: There is no abdominal tenderness.  Musculoskeletal:  General: No tenderness.     Cervical back: Neck supple.  Lymphadenopathy:     Cervical: No cervical adenopathy.  Skin:    General: Skin is warm and dry.     Capillary Refill: Capillary refill takes less than 2 seconds.     Findings: No rash.  Neurological:     Mental Status: She is alert and oriented  to person, place, and time.  Psychiatric:        Behavior: Behavior normal.      Vitals:   01/13/23 1554  BP: 130/68  Pulse: 78  SpO2: 100%  Weight: 173 lb 9.6 oz (78.7 kg)  Height: 5' 9"$  (1.753 m)   100% on RA BMI Readings from Last 3 Encounters:  01/13/23 25.64 kg/m  01/11/23 26.05 kg/m  12/30/22 25.40 kg/m   Wt Readings from Last 3 Encounters:  01/13/23 173 lb 9.6 oz (78.7 kg)  01/11/23 176 lb 6.4 oz (80 kg)  12/30/22 172 lb (78 kg)    CBC    Component Value Date/Time   WBC 5.5 01/11/2023 1216   RBC 4.60 01/11/2023 1216   HGB 14.9 01/11/2023 1216   HCT 44.2 01/11/2023 1216   PLT 246.0 01/11/2023 1216   MCV 96.1 01/11/2023 1216   MCH 31.5 08/20/2022 1434   MCHC 33.7 01/11/2023 1216   RDW 13.2 01/11/2023 1216   LYMPHSABS 2.3 01/11/2023 1216   MONOABS 0.6 01/11/2023 1216   EOSABS 0.0 01/11/2023 1216   BASOSABS 0.0 01/11/2023 1216     Chest Imaging: Low-dose lung cancer screening CT from August 2020. Upper lobe 6.3 mm right apex lung nodule.  Lung cancer screening CT January 2024: Lung RADS 2, evidence of paraseptal and centrilobular eczema. The patient's images have been independently reviewed by me.    Pulmonary Functions Testing Results:    Latest Ref Rng & Units 03/03/2020   11:47 AM 07/05/2017    9:17 AM  PFT Results  FVC-Pre L 3.41  3.42   FVC-Predicted Pre % 93  92   FVC-Post L 3.43  3.56   FVC-Predicted Post % 94  96   Pre FEV1/FVC % % 69  68   Post FEV1/FCV % % 70  68   FEV1-Pre L 2.34  2.33   FEV1-Predicted Pre % 84  82   FEV1-Post L 2.41  2.41   DLCO uncorrected ml/min/mmHg 14.91    DLCO UNC% % 65    DLCO corrected ml/min/mmHg 14.91    DLCO COR %Predicted % 65    DLVA Predicted % 68    TLC L 5.61    TLC % Predicted % 96    RV % Predicted % 85      FeNO: None   Pathology: None   Echocardiogram: None (ordered not completed)   Heart Catheterization: None     Assessment & Plan:     ICD-10-CM   1. COPD GOLD I   J44.9      2. Former smoker  Z87.891     3. Centrilobular emphysema ([place])  J43.2     4. Decreased diffusion capacity  R94.2        Discussion: This is a 72 year old female longstanding history of smoking, pulmonary function test with very mild COPD, halves a mixed pattern with preserved ratio but she does have reduced DLCO.  CT imaging consistent with emphysema as well.  Plan: Start Stiolto. She did not want to be on a ICS with increased risk for bone thinning. We talked about  this even though it is a small dose of steroid. Continue albuterol as needed. Continue annual lung cancer screening CT. ONO on room air  Follow-up with [place] in 6 months or as needed.   Current Outpatient Medications:    acetaminophen (TYLENOL) 325 MG tablet, Take 650 mg by mouth every 6 (six) hours as needed., Disp: , Rfl:    Calcium Carbonate-Vitamin D (CALCIUM + D PO), Take 1 tablet by mouth daily. , Disp: , Rfl:    Estradiol 0.75 MG/1.25 GM (0.06%) topical gel, Place 1.25 g onto the skin daily., Disp: , Rfl:    Fluticasone-Umeclidin-Vilant (TRELEGY ELLIPTA) 100-62.5-25 MCG/ACT AEPB, Inhale 1 puff into the lungs daily., Disp: 90 each, Rfl: 3   levothyroxine (SYNTHROID) 100 MCG tablet, Take 1 tablet (100 mcg total) by mouth daily., Disp: 90 tablet, Rfl: 3   Multiple Vitamins-Minerals (PRESERVISION AREDS 2 PO), Take 1 tablet by mouth 2 (two) times daily., Disp: , Rfl:    Tiotropium Bromide-Olodaterol (STIOLTO RESPIMAT) 2.5-2.5 MCG/ACT AERS, Inhale 2 puffs into the lungs daily., Disp: 4 g, Rfl: 6   Garner Nash, DO [facility] 01/13/2023 4:59 PM

## 2023-01-13 NOTE — Addendum Note (Signed)
Addended by: Alvin Critchley on: 01/13/2023 05:03 PM   Modules accepted: Orders

## 2023-01-17 ENCOUNTER — Other Ambulatory Visit (INDEPENDENT_AMBULATORY_CARE_PROVIDER_SITE_OTHER): Payer: Medicare Other

## 2023-01-17 DIAGNOSIS — R739 Hyperglycemia, unspecified: Secondary | ICD-10-CM | POA: Diagnosis not present

## 2023-01-17 DIAGNOSIS — E039 Hypothyroidism, unspecified: Secondary | ICD-10-CM

## 2023-01-17 LAB — HEMOGLOBIN A1C: Hgb A1c MFr Bld: 5.6 % (ref 4.6–6.5)

## 2023-01-24 DIAGNOSIS — R0683 Snoring: Secondary | ICD-10-CM | POA: Diagnosis not present

## 2023-01-24 DIAGNOSIS — G4730 Sleep apnea, unspecified: Secondary | ICD-10-CM | POA: Diagnosis not present

## 2023-02-08 ENCOUNTER — Telehealth: Payer: Self-pay | Admitting: Pulmonary Disease

## 2023-02-08 ENCOUNTER — Telehealth: Payer: Self-pay

## 2023-02-08 NOTE — Telephone Encounter (Signed)
Spoke to pt about her ONO results. Pt does not need oxygen. Pt verbalized understanding. Nothing further needed.

## 2023-02-08 NOTE — Telephone Encounter (Signed)
Pt called stating that she has been taking Stiolto since last OV. Pt has been having extreme back pain for the past 3 weeks and said that she did look up side effects from the inhaler and said that back pain was listed as one of the side effects to the inhaler.   Due to this, pt cannot continue to take the [facility] and wants to know if she should go back to taking the Trelegy or a different inhaler. She said that the Trelegy did not help her as well as the Stiolto has with her breathing but she cannot continue to have the back pain as bad as she is having it.  Dr. Valeta Harms, please advise.

## 2023-02-08 NOTE — Telephone Encounter (Signed)
Called patient and she states she will be here tomorrow to pick up some samples of breztri. Nothing further needed

## 2023-02-28 ENCOUNTER — Other Ambulatory Visit: Payer: Medicare Other

## 2023-04-14 DIAGNOSIS — Z1231 Encounter for screening mammogram for malignant neoplasm of breast: Secondary | ICD-10-CM | POA: Diagnosis not present

## 2023-04-14 LAB — HM MAMMOGRAPHY

## 2023-04-19 ENCOUNTER — Telehealth: Payer: Self-pay | Admitting: Pulmonary Disease

## 2023-04-19 NOTE — Telephone Encounter (Signed)
PT calling with issues on O2 levels. Upset no appts avail right away w/Dr. Tonia Brooms and NP 'S. Adv we can still set appt for late June and see if anything can be done sooner for her. She already has appt w/Dr. PCP on June 6th because I asked her.  Please call to advise if we can see sooner. TY [phone]

## 2023-04-25 ENCOUNTER — Ambulatory Visit: Payer: Medicare Other | Admitting: Adult Health

## 2023-04-25 ENCOUNTER — Encounter: Payer: Self-pay | Admitting: Adult Health

## 2023-04-25 VITALS — BP 110/70 | HR 87 | Temp 97.4°F | Ht 69.0 in | Wt 173.8 lb

## 2023-04-25 DIAGNOSIS — R0609 Other forms of dyspnea: Secondary | ICD-10-CM | POA: Diagnosis not present

## 2023-04-25 DIAGNOSIS — J449 Chronic obstructive pulmonary disease, unspecified: Secondary | ICD-10-CM

## 2023-04-25 LAB — BASIC METABOLIC PANEL
BUN: 20 mg/dL (ref 7–25)
CO2: 28 mmol/L (ref 20–32)
Calcium: 9.3 mg/dL (ref 8.6–10.4)
Chloride: 105 mmol/L (ref 98–110)
Creat: 0.71 mg/dL (ref 0.60–1.00)
Glucose, Bld: 99 mg/dL (ref 65–99)
Potassium: 4.5 mmol/L (ref 3.5–5.3)
Sodium: 139 mmol/L (ref 135–146)

## 2023-04-25 LAB — D-DIMER, QUANTITATIVE: D-Dimer, Quant: 0.39 mcg/mL FEU (ref <0.50)

## 2023-04-25 LAB — CBC WITH DIFFERENTIAL/PLATELET
Absolute Monocytes: 783 cells/uL (ref 200–950)
Basophils Absolute: 38 cells/uL (ref 0–200)
Basophils Relative: 0.5 %
Eosinophils Absolute: 68 cells/uL (ref 15–500)
Eosinophils Relative: 0.9 %
HCT: 39.7 % (ref 35.0–45.0)
Hemoglobin: 13.2 g/dL (ref 11.7–15.5)
Lymphs Abs: 2402 cells/uL (ref 850–3900)
MCH: 31.1 pg (ref 27.0–33.0)
MCHC: 33.2 g/dL (ref 32.0–36.0)
MCV: 93.4 fL (ref 80.0–100.0)
MPV: 10.9 fL (ref 7.5–12.5)
Monocytes Relative: 10.3 %
Neutro Abs: 4309 cells/uL (ref 1500–7800)
Neutrophils Relative %: 56.7 %
Platelets: 226 10*3/uL (ref 140–400)
RBC: 4.25 10*6/uL (ref 3.80–5.10)
RDW: 12 % (ref 11.0–15.0)
Total Lymphocyte: 31.6 %
WBC: 7.6 10*3/uL (ref 3.8–10.8)

## 2023-04-25 LAB — BRAIN NATRIURETIC PEPTIDE: Brain Natriuretic Peptide: 47 pg/mL (ref <100)

## 2023-04-25 MED ORDER — ALBUTEROL SULFATE HFA 108 (90 BASE) MCG/ACT IN AERS: 1.0000 | INHALATION_SPRAY | Freq: Four times a day (QID) | RESPIRATORY_TRACT | 2 refills | Status: DC | PRN

## 2023-04-25 MED ORDER — BREZTRI AEROSPHERE 160-9-4.8 MCG/ACT IN AERO: 2.0000 | INHALATION_SPRAY | Freq: Two times a day (BID) | RESPIRATORY_TRACT | 5 refills | Status: DC

## 2023-04-25 NOTE — Progress Notes (Unsigned)
@Patient  ID: Candace Frey, female    DOB: Aug 06, 1951, 72 y.o.   MRN: [id number]  Chief Complaint  Patient presents with   Acute Visit    Referring provider: Shelva Majestic, MD  HPI: 72 year old female former smoker followed for COPD Participates in the lung cancer CT screening program  TEST/EVENTS :  01/2023 ONO negative.   CT chest November 30, 2022 mild emphysema, stable pulmonary nodules max measuring 3.9 mm, lung RADS 2  2D echo April 2021 EF 60-65%, right ventricular systolic function normal, mildly elevated pulmonary artery systolic pressure, RV size normal  PFTs March 03, 2020 showed mild airflow obstruction with FEV1 84%, ratio 69, FVC 93%, no significant bronchodilator response, mid flow reversibility, diffusing capacity decreased at 65%.   04/25/2023 Acute OV : Shortness of breath and COPD  Patient presents for an acute office visit.  Patient has underlying mild COPD with emphysema.  Patient complains of Overnight oximetry test in March was reported as negative with no significant nocturnal desaturations.  Patient is on any oxygen at home.  Patient's pulse oximeter showed some desaturations at home at 93%.   O2 saturations today in the office are 97% on room air. Last visit patient was started on Stiolto daily  Sudden onset of doe after trip to [place]. No calf pain or edema . No chest pain .  No cough or wheezing . No hemotpysis  DVT In past . Tx with AC x 6 months .   No Known Allergies  Immunization History  Administered Date(s) Administered   Covid-19, Mrna,Vaccine(Spikevax)87yrs and older 08/27/2022   Fluad Quad(high Dose 65+) 09/06/2020, 10/08/2021   Influenza, High Dose Seasonal PF 08/18/2022   Influenza,inj,Quad PF,6+ Mos 09/08/2019   Moderna Covid-19 Vaccine Bivalent Booster 52yrs & up 07/06/2021   Moderna SARS-COV2 Booster Vaccination 10/01/2020, 03/21/2021   Moderna Sars-Covid-2 Vaccination 12/21/2019, 01/18/2020   Pneumococcal Conjugate-13  07/14/2020   Pneumococcal Polysaccharide-23 07/01/2014, 06/14/2016   Tdap 02/21/2014    Past Medical History:  Diagnosis Date   Acute deep vein thrombosis (DVT) of distal vein of right lower extremity (HCC) 07/22/2016   Acute medial meniscal tear 01/29/2015   Chicken pox    Chronic obstructive lung disease (HCC) 05/23/2019   Colon polyps    Complication of anesthesia    slow to wake up    COPD (chronic obstructive pulmonary disease) (HCC)    Genital warts    Multiple pulmonary nodules determined by computed tomography of lung 07/05/2017   CT 03/03/17  Right lower lobe nodularity described on the prior exam is less apparent today, favored to represent an area of scarring. other tiny nodules described on the prior are not readily apparent. No enlarging or dominant nodule identified > rec continue low dose annual  screening until 2023 per PCP (pt has fm hx lung ca)    Osteopenia    Shortness of breath dyspnea    with exertion was a smoker for many year per patient    Thyroid disease    UTI (urinary tract infection)     Tobacco History: Social History   Tobacco Use  Smoking Status Former   Packs/day: 2.00   Years: 43.00   Additional pack years: 0.00   Total pack years: 86.00   Types: Cigarettes   Quit date: 11/22/2006   Years since quitting: 16.4  Smokeless Tobacco Never   Counseling given: Not Answered   Outpatient Medications Prior to Visit  Medication Sig Dispense Refill   acetaminophen (TYLENOL) 325  MG tablet Take 650 mg by mouth every 6 (six) hours as needed.     Calcium Carbonate-Vitamin D (CALCIUM + D PO) Take 1 tablet by mouth daily.      levothyroxine (SYNTHROID) 100 MCG tablet Take 1 tablet (100 mcg total) by mouth daily. 90 tablet 3   Multiple Vitamins-Minerals (PRESERVISION AREDS 2 PO) Take 1 tablet by mouth 2 (two) times daily.     Estradiol 0.75 MG/1.25 GM (0.06%) topical gel Place 1.25 g onto the skin daily. (Patient not taking: Reported on 04/25/2023)      Fluticasone-Umeclidin-Vilant (TRELEGY ELLIPTA) 100-62.5-25 MCG/ACT AEPB Inhale 1 puff into the lungs daily. (Patient not taking: Reported on 04/25/2023) 90 each 3   Tiotropium Bromide-Olodaterol (STIOLTO RESPIMAT) 2.5-2.5 MCG/ACT AERS Inhale 2 puffs into the lungs daily. (Patient not taking: Reported on 04/25/2023) 4 g 6   No facility-administered medications prior to visit.     Review of Systems:   Constitutional:   No  weight loss, night sweats,  Fevers, chills, fatigue, or  lassitude.  HEENT:   No headaches,  Difficulty swallowing,  Tooth/dental problems, or  Sore throat,                No sneezing, itching, ear ache, nasal congestion, post nasal drip,   CV:  No chest pain,  Orthopnea, PND, swelling in lower extremities, anasarca, dizziness, palpitations, syncope.   GI  No heartburn, indigestion, abdominal pain, nausea, vomiting, diarrhea, change in bowel habits, loss of appetite, bloody stools.   Resp: No shortness of breath with exertion or at rest.  No excess mucus, no productive cough,  No non-productive cough,  No coughing up of blood.  No change in color of mucus.  No wheezing.  No chest wall deformity  Skin: no rash or lesions.  GU: no dysuria, change in color of urine, no urgency or frequency.  No flank pain, no hematuria   MS:  No joint pain or swelling.  No decreased range of motion.  No back pain.    Physical Exam  BP 110/70 (BP Location: Left Arm, Patient Position: Sitting, Cuff Size: Normal)   Pulse 87   Temp (!) 97.4 F (36.3 C) (Oral)   Ht 5\' 9"  (1.753 m)   Wt 173 lb 12.8 oz (78.8 kg)   SpO2 97%   BMI 25.67 kg/m   GEN: A/Ox3; pleasant , NAD, well nourished    HEENT:  Aline/AT,  EACs-clear, TMs-wnl, NOSE-clear, THROAT-clear, no lesions, no postnasal drip or exudate noted.   NECK:  Supple w/ fair ROM; no JVD; normal carotid impulses w/o bruits; no thyromegaly or nodules palpated; no lymphadenopathy.    RESP  Clear  P & A; w/o, wheezes/ rales/ or rhonchi. no  accessory muscle use, no dullness to percussion  CARD:  RRR, no m/r/g, no peripheral edema, pulses intact, no cyanosis or clubbing.  GI:   Soft & nt; nml bowel sounds; no organomegaly or masses detected.   Musco: Warm bil, no deformities or joint swelling noted.   Neuro: alert, no focal deficits noted.    Skin: Warm, no lesions or rashes    Lab Results:  CBC    Component Value Date/Time   WBC 5.5 01/11/2023 1216   RBC 4.60 01/11/2023 1216   HGB 14.9 01/11/2023 1216   HCT 44.2 01/11/2023 1216   PLT 246.0 01/11/2023 1216   MCV 96.1 01/11/2023 1216   MCH 31.5 08/20/2022 1434   MCHC 33.7 01/11/2023 1216   RDW 13.2 01/11/2023  1216   LYMPHSABS 2.3 01/11/2023 1216   MONOABS 0.6 01/11/2023 1216   EOSABS 0.0 01/11/2023 1216   BASOSABS 0.0 01/11/2023 1216    BMET    Component Value Date/Time   NA 138 01/11/2023 1216   K 4.1 01/11/2023 1216   CL 100 01/11/2023 1216   CO2 27 01/11/2023 1216   GLUCOSE 102 (H) 01/11/2023 1216   BUN 9 01/11/2023 1216   CREATININE 0.69 01/11/2023 1216   CREATININE 0.77 07/14/2020 1442   CALCIUM 9.3 01/11/2023 1216   GFRNONAA >60 08/20/2022 1434   GFRAA >60 01/30/2017 2344    BNP No results found for: "BNP"  ProBNP No results found for: "PROBNP"  Imaging: No results found.       Latest Ref Rng & Units 03/03/2020   11:47 AM 07/05/2017    9:17 AM  PFT Results  FVC-Pre L 3.41  3.42   FVC-Predicted Pre % 93  92   FVC-Post L 3.43  3.56   FVC-Predicted Post % 94  96   Pre FEV1/FVC % % 69  68   Post FEV1/FCV % % 70  68   FEV1-Pre L 2.34  2.33   FEV1-Predicted Pre % 84  82   FEV1-Post L 2.41  2.41   DLCO uncorrected ml/min/mmHg 14.91    DLCO UNC% % 65    DLCO corrected ml/min/mmHg 14.91    DLCO COR %Predicted % 65    DLVA Predicted % 68    TLC L 5.61    TLC % Predicted % 96    RV % Predicted % 85      No results found for: "NITRICOXIDE"      Assessment & Plan:   No problem-specific Assessment & Plan notes found for  this encounter.      Rubye Oaks, NP 04/25/2023

## 2023-04-25 NOTE — Patient Instructions (Addendum)
Begin Breztri 2 puffs Twice daily, rinse after use.  Albuterol inhaler 1-2 puffs every 6hrs as needed.  Labs today .  Activity as tolerated.  Follow up in 3 weeks with Dr. Tonia Brooms or Aspynn Clover NP and As needed   Please contact office for sooner follow up if symptoms do not improve or worsen or seek emergency care

## 2023-04-25 NOTE — Telephone Encounter (Signed)
Checked and saw that TP had cancellation today 6/3 at 4pm. Called pt to let her know about this and she stated she would take that appt. Pt's appt has been changed. Nothing further needed.

## 2023-04-26 ENCOUNTER — Ambulatory Visit: Payer: Medicare Other

## 2023-04-26 MED ORDER — BREZTRI AEROSPHERE 160-9-4.8 MCG/ACT IN AERO: 2.0000 | INHALATION_SPRAY | Freq: Two times a day (BID) | RESPIRATORY_TRACT | 0 refills | Status: DC

## 2023-04-26 NOTE — Assessment & Plan Note (Signed)
Check labs today.  If D-dimer is elevated will need to set up for CT chest.  If BNP is elevated consider echo. Trial of triple therapy inhaler.  Begin Breztri twice daily  Plan  Patient Instructions  Begin Breztri 2 puffs Twice daily, rinse after use.  Albuterol inhaler 1-2 puffs every 6hrs as needed.  Labs today .  Activity as tolerated.  Follow up in 3 weeks with Dr. Tonia Brooms or Hesper Venturella NP and As needed   Please contact office for sooner follow up if symptoms do not improve or worsen or seek emergency care

## 2023-04-27 ENCOUNTER — Ambulatory Visit (INDEPENDENT_AMBULATORY_CARE_PROVIDER_SITE_OTHER): Payer: Medicare Other

## 2023-04-27 DIAGNOSIS — R0609 Other forms of dyspnea: Secondary | ICD-10-CM

## 2023-04-27 DIAGNOSIS — J439 Emphysema, unspecified: Secondary | ICD-10-CM | POA: Diagnosis not present

## 2023-04-27 DIAGNOSIS — R0602 Shortness of breath: Secondary | ICD-10-CM | POA: Diagnosis not present

## 2023-04-28 ENCOUNTER — Ambulatory Visit (INDEPENDENT_AMBULATORY_CARE_PROVIDER_SITE_OTHER): Payer: Medicare Other | Admitting: Family Medicine

## 2023-04-28 ENCOUNTER — Encounter: Payer: Self-pay | Admitting: Family Medicine

## 2023-04-28 VITALS — BP 129/78 | HR 69 | Temp 98.2°F | Resp 16 | Ht 69.0 in | Wt 173.6 lb

## 2023-04-28 DIAGNOSIS — J449 Chronic obstructive pulmonary disease, unspecified: Secondary | ICD-10-CM | POA: Diagnosis not present

## 2023-04-28 DIAGNOSIS — E039 Hypothyroidism, unspecified: Secondary | ICD-10-CM | POA: Diagnosis not present

## 2023-04-28 DIAGNOSIS — E785 Hyperlipidemia, unspecified: Secondary | ICD-10-CM

## 2023-04-28 DIAGNOSIS — I700 Atherosclerosis of aorta: Secondary | ICD-10-CM

## 2023-04-28 DIAGNOSIS — R0609 Other forms of dyspnea: Secondary | ICD-10-CM

## 2023-04-28 NOTE — Progress Notes (Signed)
Phone [phone] In person visit   Subjective:   Candace Frey is a 72 y.o. year old very pleasant female patient who presents for/with See problem oriented charting Chief Complaint  Patient presents with   Shortness of Breath    Checking pulse ox at home, dropping into low 90's  Seen at pulmonologist earlier this week, all testing was negative - was not given an explanation as to what is causing her O2 to drop into the low 90's    Joint Swelling    Bilateral ankles, right worst than left On and off for the past several years, has worsened over the past couple weeks   Past Medical History-  Patient Active Problem List   Diagnosis Date Noted   Hyperlipidemia 06/28/2022    Priority: Medium    Aortic atherosclerosis (HCC) 06/18/2021    Priority: Medium    Acquired hypothyroidism 07/17/2020    Priority: Medium    COPD GOLD I  07/05/2017    Priority: Medium    History of DVT (deep vein thrombosis) 07/22/2016    Priority: Low    Medications- reviewed and updated Current Outpatient Medications  Medication Sig Dispense Refill   albuterol (VENTOLIN HFA) 108 (90 Base) MCG/ACT inhaler Inhale 1-2 puffs into the lungs every 6 (six) hours as needed. 8 g 2   Budeson-Glycopyrrol-Formoterol (BREZTRI AEROSPHERE) 160-9-4.8 MCG/ACT AERO Inhale 2 puffs into the lungs in the morning and at bedtime. 1 each 5   Budeson-Glycopyrrol-Formoterol (BREZTRI AEROSPHERE) 160-9-4.8 MCG/ACT AERO Inhale 2 puffs into the lungs in the morning and at bedtime. 5.9 g 0   Calcium Carbonate-Vitamin D (CALCIUM + D PO) Take 1 tablet by mouth daily.      Estradiol 0.75 MG/1.25 GM (0.06%) topical gel Place 1.25 g onto the skin daily.     levothyroxine (SYNTHROID) 100 MCG tablet Take 1 tablet (100 mcg total) by mouth daily. 90 tablet 3   Multiple Vitamins-Minerals (PRESERVISION AREDS 2 PO) Take 1 tablet by mouth 2 (two) times daily.     No current facility-administered medications for this visit.      Objective:  BP 129/78   Pulse 69   Temp 98.2 F (36.8 C) (Temporal)   Resp 16   Ht 5\' 9"  (1.753 m)   Wt 173 lb 9.6 oz (78.7 kg)   SpO2 98%   BMI 25.64 kg/m  Gen: NAD, resting comfortably CV: RRR no murmurs rubs or gallops Lungs: CTAB no crackles, wheeze, rhonchi Abdomen: soft/nontender/nondistended/normal bowel sounds. No rebound or guarding.  Ext: Trace edema right slightly worse than left Skin: warm, dry    Assessment and Plan   # Shortness of breath in patient with COPD S: Review of last visit and pulmonology notes: -She has some baseline shortness of breath that was reported at last visit with no particular worsening other than if anxiety were worse-at last visit her husband had noted more coughing-at that time we opted to restart her Trelegy and refer back to Dr. Tonia Brooms.  She has not seen cardiology since 2022-at that time had cardiopulmonary testing-with possible diastolic dysfunction but the honestly felt like she had excellent functional capacity compared to sedentary norms.  CBC, CMP, TSH largely reassuring at that time -She also had echocardiogram 03/10/2020 with ejection fraction of 60 to 65%, mild LVH -CT of her chest every 08/12/2023 showed mild emphysema, stable pulmonary nodules max measuring 3.9 mm -She previously had overnight oximetry with no significant nocturnal desaturations  At visit with pulmonology yesterday they mentioned  previously changing her to [employer] but did not seem to make any significant difference in breathing and caused some back pain -They checked a D-dimer which was not elevated -Chest x-ray largely reassuring - They checked a BNP which was not elevated-pointing way from heart failure - Advanced her to [facility] and recommended 3-week follow-up  Today reports: She reports was out of town and very active and felt good overall then came home about 3rd of may and with similar activity at home felt winded/fatigued- she opted to chexk her pulse  oximeter at home and notes numbers into the low usually around 93- tested herself for COVID and was negative.  She reports being seen the pulmonologist earlier this week and testing was negative/reassuring but she still did not feel she had a clear explanation as what was causing the drop in oxygen. Some throat clearing in last 48 hours  Walking a mile a day or twice a day- has to slow down and take it easier with heels- feels winded- started in last 2 months- breathing worsened since first of may but does not continue to worsen. Not wheezing.  A/P: Patient with COPD and some baseline shortness of breath but with worsening starting about a month ago-stable since that time but still decreased functional ability from her baseline - D-dimer (Particularly pleased she did this with history of DVT though provoked) was negative pointing way from pulmonary embolism, chest x-ray without pneumonia, BNP pointing away from heart failure -Prior cardiac testing about 2 years ago was largely reassuring - CT of the chest from September 2024 without obvious cause but granted that was before she had the worsening-she has not had any coronary artery calcium noted even though she does have aortic atherosclerosis-I think this lowers her risk of having cardiac disease.  No palpitations to suggest need for cardiac monitor - I think is reasonable to see how she does on breztri and follow up with pulmonary  -Without chest pain I do not feel strongly about starting with cardiology evaluation but we discussed if this develops or if she develops worsening shortness of breath but I would want to certainly reconsider that-she will keep [place] in the loop -There has been mention of possible pulmonary rehab And I think that would be a reasonable step as well -Patient wants to be proactive as possible  # Bilateral edema S: Patient notes welling in bilateral ankles-right is worse than the left - She has noted this for several years but  seems to be worse in the last couple weeks -Venous duplex 02/04/2022 showed no signs of clot in the leg -occasionally wears compression stockings such as on planes, tries to pedal feet A/P: On exam appears very similar to February visit-with reassuring BMP I do not think we need to proceed with echocardiogram at this time-continue to monitor  #hyperlipidemia # Aortic atherosclerosis-we discussed this diagnosis S: Medication: None Lab Results  Component Value Date   CHOL 182 01/11/2023   HDL 87.10 01/11/2023   LDLCALC 78 01/11/2023   TRIG 83.0 01/11/2023   CHOLHDL 2 01/11/2023   A/P: aortic atherosclerosis (presumed stable)- LDL goal ideally <70-very slightly above goal and no coronary artery calcium-we discussed monitoring without medicine for now-she felt better once we discussed the fact that there is no coronary artery calcium noted  #hypothyroidism S: compliant On thyroid medication-levothyroxine 100 mcg Lab Results  Component Value Date   TSH 0.36 01/11/2023    A/P: Thyroid has been well-controlled-I do not think this is  the cause of her symptoms  Recommended follow up: Return for next already scheduled visit or sooner if needed. Future Appointments  Date Time Provider Department Center  05/25/2023  9:30 AM Parrett, Virgel Bouquet, NP [facility] None  07/18/2023 10:20 AM Shelva Majestic, MD [facility]  01/05/2024  3:30 PM [facility] ANNUAL WELLNESS VISIT 1 [facility]    Lab/Order associations:   ICD-10-CM   1. DOE (dyspnea on exertion)  R06.09     2. Acquired hypothyroidism  E03.9     3. Aortic atherosclerosis (HCC)  I70.0     4. COPD GOLD I   J44.9     5. Hyperlipidemia, unspecified hyperlipidemia type  E78.5       No orders of the defined types were placed in this encounter.   Return precautions advised.  Tana Conch, MD

## 2023-04-28 NOTE — Patient Instructions (Addendum)
I agree with seeing how you do on BREZTRI and then consider pulmonary rehab if not improving on that  If symptoms worsen or you get any chest pain- lets have you get checked out immediately and consider cardiology follow up   Do not worry about oxygen levels 92 or above. If you are 88-92 and feel poorly suggest resting until recover. If below 88 seek care  Recommended follow up: Return for next already scheduled visit or sooner if needed.

## 2023-05-02 ENCOUNTER — Ambulatory Visit: Payer: Medicare Other | Admitting: Adult Health

## 2023-05-18 ENCOUNTER — Ambulatory Visit: Payer: Medicare Other | Admitting: Primary Care

## 2023-05-25 ENCOUNTER — Ambulatory Visit: Payer: Medicare Other | Admitting: Adult Health

## 2023-05-25 ENCOUNTER — Encounter: Payer: Self-pay | Admitting: Adult Health

## 2023-05-25 VITALS — BP 98/58 | HR 78 | Temp 97.8°F | Ht 69.0 in | Wt 173.2 lb

## 2023-05-25 DIAGNOSIS — J449 Chronic obstructive pulmonary disease, unspecified: Secondary | ICD-10-CM | POA: Diagnosis not present

## 2023-05-25 NOTE — Progress Notes (Signed)
@Patient  ID: Candace Frey, female    DOB: 1951-04-25, 72 y.o.   MRN: [id number]  Chief Complaint  Patient presents with   Follow-up    Referring provider: Shelva Majestic, MD  HPI: 72 year old female former smoker followed for COPD with emphysema  Patient presents with pains in the lung cancer CT chest screening program History of DVT treated with anticoagulation therapy for 6 months  TEST/EVENTS :  01/2023 ONO negative.    CT chest November 30, 2022 mild emphysema, stable pulmonary nodules max measuring 3.9 mm, lung RADS 2   2D echo April 2021 EF 60-65%, right ventricular systolic function normal, mildly elevated pulmonary artery systolic pressure, RV size normal   PFTs March 03, 2020 showed mild airflow obstruction with FEV1 84%, ratio 69, FVC 93%, no significant bronchodilator response, mid flow reversibility, diffusing capacity decreased at 65%.  05/25/2023 Follow up : COPD  Patient presents for 1 month follow-up.  Patient was seen last visit with increased complaints of shortness of breath with activities.  Felt to be related to her COPD.  Lab work was unrevealing with a normal D-dimer and BNP and CBC.  Chest x-ray was clear.  She had previously been on Stiolto but was unable to tolerate due to back pain.  Patient says Markus Daft did not really change her breathing at all and caused significant hoarseness despite rinsing her mouth out very well.  Since patient says since last visit she is doing okay.  She is trying to be more active.  She is swimming more and staying active at home.  She denies any increased cough or wheezing.  Does use her albuterol on occasion.  She denies any hemoptysis chest pain or orthopnea or edema.   No Known Allergies  Immunization History  Administered Date(s) Administered   Covid-19, Mrna,Vaccine(Spikevax)52yrs and older 08/27/2022   Fluad Quad(high Dose 65+) 09/06/2020, 10/08/2021   Influenza, High Dose Seasonal PF 08/18/2022    Influenza,inj,Quad PF,6+ Mos 09/08/2019   Moderna Covid-19 Vaccine Bivalent Booster 67yrs & up 07/06/2021   Moderna SARS-COV2 Booster Vaccination 10/01/2020, 03/21/2021   Moderna Sars-Covid-2 Vaccination 12/21/2019, 01/18/2020   Pneumococcal Conjugate-13 07/14/2020   Pneumococcal Polysaccharide-23 07/01/2014, 06/14/2016   Tdap 02/21/2014    Past Medical History:  Diagnosis Date   Acute deep vein thrombosis (DVT) of distal vein of right lower extremity (HCC) 07/22/2016   Acute medial meniscal tear 01/29/2015   Chicken pox    Chronic obstructive lung disease (HCC) 05/23/2019   Colon polyps    Complication of anesthesia    slow to wake up    COPD (chronic obstructive pulmonary disease) (HCC)    Genital warts    Multiple pulmonary nodules determined by computed tomography of lung 07/05/2017   CT 03/03/17  Right lower lobe nodularity described on the prior exam is less apparent today, favored to represent an area of scarring. other tiny nodules described on the prior are not readily apparent. No enlarging or dominant nodule identified > rec continue low dose annual  screening until 2023 per PCP (pt has fm hx lung ca)    Osteopenia    Shortness of breath dyspnea    with exertion was a smoker for many year per patient    Thyroid disease    UTI (urinary tract infection)     Tobacco History: Social History   Tobacco Use  Smoking Status Former   Packs/day: 2.00   Years: 43.00   Additional pack years: 0.00   Total pack years:  86.00   Types: Cigarettes   Quit date: 11/22/2006   Years since quitting: 16.5  Smokeless Tobacco Never   Counseling given: Not Answered   Outpatient Medications Prior to Visit  Medication Sig Dispense Refill   albuterol (VENTOLIN HFA) 108 (90 Base) MCG/ACT inhaler Inhale 1-2 puffs into the lungs every 6 (six) hours as needed. 8 g 2   Calcium Carbonate-Vitamin D (CALCIUM + D PO) Take 1 tablet by mouth daily.      Estradiol 0.75 MG/1.25 GM (0.06%) topical gel Place  1.25 g onto the skin daily.     levothyroxine (SYNTHROID) 100 MCG tablet Take 1 tablet (100 mcg total) by mouth daily. 90 tablet 3   Multiple Vitamins-Minerals (PRESERVISION AREDS 2 PO) Take 1 tablet by mouth 2 (two) times daily.     Budeson-Glycopyrrol-Formoterol (BREZTRI AEROSPHERE) 160-9-4.8 MCG/ACT AERO Inhale 2 puffs into the lungs in the morning and at bedtime. (Patient not taking: Reported on 05/25/2023) 1 each 5   Budeson-Glycopyrrol-Formoterol (BREZTRI AEROSPHERE) 160-9-4.8 MCG/ACT AERO Inhale 2 puffs into the lungs in the morning and at bedtime. (Patient not taking: Reported on 05/25/2023) 5.9 g 0   No facility-administered medications prior to visit.     Review of Systems:   Constitutional:   No  weight loss, night sweats,  Fevers, chills,  +fatigue, or  lassitude.  HEENT:   No headaches,  Difficulty swallowing,  Tooth/dental problems, or  Sore throat,                No sneezing, itching, ear ache, nasal congestion, post nasal drip,   CV:  No chest pain,  Orthopnea, PND, swelling in lower extremities, anasarca, dizziness, palpitations, syncope.   GI  No heartburn, indigestion, abdominal pain, nausea, vomiting, diarrhea, change in bowel habits, loss of appetite, bloody stools.   Resp:  No chest wall deformity  Skin: no rash or lesions.  GU: no dysuria, change in color of urine, no urgency or frequency.  No flank pain, no hematuria   MS:  No joint pain or swelling.  No decreased range of motion.  No back pain.    Physical Exam  BP (!) 98/58 (BP Location: Left Arm, Patient Position: Sitting, Cuff Size: Normal)   Pulse 78   Temp 97.8 F (36.6 C) (Oral)   Ht 5\' 9"  (1.753 m)   Wt 173 lb 3.2 oz (78.6 kg)   SpO2 98%   BMI 25.58 kg/m   GEN: A/Ox3; pleasant , NAD, well nourished    HEENT:  [place]/AT,  NOSE-clear, THROAT-clear, no lesions, no postnasal drip or exudate noted.   NECK:  Supple w/ fair ROM; no JVD; normal carotid impulses w/o bruits; no thyromegaly or nodules  palpated; no lymphadenopathy.    RESP  Clear  P & A; w/o, wheezes/ rales/ or rhonchi. no accessory muscle use, no dullness to percussion  CARD:  RRR, no m/r/g, no peripheral edema, pulses intact, no cyanosis or clubbing.  GI:   Soft & nt; nml bowel sounds; no organomegaly or masses detected.   Musco: Warm bil, no deformities or joint swelling noted.   Neuro: alert, no focal deficits noted.    Skin: Warm, no lesions or rashes    Lab Results:  CBC    Component Value Date/Time   WBC 7.6 04/25/2023 1642   RBC 4.25 04/25/2023 1642   HGB 13.2 04/25/2023 1642   HCT 39.7 04/25/2023 1642   PLT 226 04/25/2023 1642   MCV 93.4 04/25/2023 1642   MCH  31.1 04/25/2023 1642   MCHC 33.2 04/25/2023 1642   RDW 12.0 04/25/2023 1642   LYMPHSABS 2,402 04/25/2023 1642   MONOABS 0.6 01/11/2023 1216   EOSABS 68 04/25/2023 1642   BASOSABS 38 04/25/2023 1642    BMET    Component Value Date/Time   NA 139 04/25/2023 1642   K 4.5 04/25/2023 1642   CL 105 04/25/2023 1642   CO2 28 04/25/2023 1642   GLUCOSE 99 04/25/2023 1642   BUN 20 04/25/2023 1642   CREATININE 0.71 04/25/2023 1642   CALCIUM 9.3 04/25/2023 1642   GFRNONAA >60 08/20/2022 1434   GFRAA >60 01/30/2017 2344    BNP    Component Value Date/Time   BNP 47 04/25/2023 1642    ProBNP No results found for: "PROBNP"  Imaging: DG Chest 2 View  Result Date: 04/27/2023 CLINICAL DATA:  Shortness of breath. EXAM: CHEST - 2 VIEW COMPARISON:  01/31/2017, chest CT 11/30/2022 FINDINGS: The cardiomediastinal contours are normal. Emphysematous changes on prior CT are not well-defined by radiograph. Pulmonary vasculature is normal. No consolidation, pleural effusion, or pneumothorax. Thoracic spondylosis with anterior spurring. No acute osseous abnormalities are seen. IMPRESSION: No acute abnormality. Emphysematous changes on prior CT are not well-defined by radiograph. Electronically Signed   By: Narda Rutherford M.D.   On: 04/27/2023 09:50          Latest Ref Rng & Units 03/03/2020   11:47 AM 07/05/2017    9:17 AM  PFT Results  FVC-Pre L 3.41  3.42   FVC-Predicted Pre % 93  92   FVC-Post L 3.43  3.56   FVC-Predicted Post % 94  96   Pre FEV1/FVC % % 69  68   Post FEV1/FCV % % 70  68   FEV1-Pre L 2.34  2.33   FEV1-Predicted Pre % 84  82   FEV1-Post L 2.41  2.41   DLCO uncorrected ml/min/mmHg 14.91    DLCO UNC% % 65    DLCO corrected ml/min/mmHg 14.91    DLCO COR %Predicted % 65    DLVA Predicted % 68    TLC L 5.61    TLC % Predicted % 96    RV % Predicted % 85      No results found for: "NITRICOXIDE"      Assessment & Plan:   COPD GOLD I  Mild COPD-patient has minimum symptom burden.  Did not tolerate Stiolto or Breztri.  Refer to pulmonary rehab.  Albuterol inhaler as needed.  Activity as tolerated.  We discussed immunizations.  She gets these through her primary care provider.  Continue with lung cancer CT chest screening program.  Plan  Patient Instructions  Albuterol inhaler 1-2 puffs every 6hrs as needed.  Refer to Pulmonary Rehab.  Activity as tolerated.  Follow up in 6 months with Dr. Tonia Brooms or Ebelin Dillehay NP and As needed   Please contact office for sooner follow up if symptoms do not improve or worsen or seek emergency care         Rubye Oaks, NP 05/25/2023

## 2023-05-25 NOTE — Patient Instructions (Signed)
Albuterol inhaler 1-2 puffs every 6hrs as needed.  Refer to Pulmonary Rehab.  Activity as tolerated.  Follow up in 6 months with Dr. Tonia Brooms or Khalifa Knecht NP and As needed   Please contact office for sooner follow up if symptoms do not improve or worsen or seek emergency care

## 2023-05-25 NOTE — Assessment & Plan Note (Signed)
Mild COPD-patient has minimum symptom burden.  Did not tolerate Stiolto or Breztri.  Refer to pulmonary rehab.  Albuterol inhaler as needed.  Activity as tolerated.  We discussed immunizations.  She gets these through her primary care provider.  Continue with lung cancer CT chest screening program.  Plan  Patient Instructions  Albuterol inhaler 1-2 puffs every 6hrs as needed.  Refer to Pulmonary Rehab.  Activity as tolerated.  Follow up in 6 months with Dr. Tonia Brooms or Bartosz Luginbill NP and As needed   Please contact office for sooner follow up if symptoms do not improve or worsen or seek emergency care

## 2023-05-27 ENCOUNTER — Telehealth (HOSPITAL_COMMUNITY): Payer: Self-pay

## 2023-05-27 NOTE — Telephone Encounter (Signed)
Received referral from Dr. Tonia Brooms for this pt to participate in Pulmonary Rehab with the diagnosis of COPD I. Clinical review of pt follow up appt on 05/25/2023 Pulmonary office note. Pt appropriate for scheduling for Pulmonary rehab. Will forward to support staff for scheduling and verification of insurance eligibility/benefits with pt consent.   Essie Hart, RN, BSN Cardiac and Pulmonary Rehab

## 2023-05-30 ENCOUNTER — Encounter (HOSPITAL_COMMUNITY): Payer: Self-pay

## 2023-06-03 ENCOUNTER — Telehealth (HOSPITAL_COMMUNITY): Payer: Self-pay

## 2023-06-03 NOTE — Telephone Encounter (Signed)
Called to confirm appt. Pt confirmed appt. Instructed pt on proper footwear. Gave directions along with department number.   

## 2023-06-06 ENCOUNTER — Encounter (HOSPITAL_COMMUNITY)
Admission: RE | Admit: 2023-06-06 | Discharge: 2023-06-06 | Disposition: A | Discharge status: Home / Self Care | Payer: Medicare Other | Source: Ambulatory Visit | Attending: Pulmonary Disease | Admitting: Pulmonary Disease

## 2023-06-06 ENCOUNTER — Encounter (HOSPITAL_COMMUNITY): Payer: Self-pay

## 2023-06-06 VITALS — BP 112/64 | HR 73 | Resp 18 | Ht 69.0 in | Wt 174.8 lb

## 2023-06-06 DIAGNOSIS — J449 Chronic obstructive pulmonary disease, unspecified: Secondary | ICD-10-CM | POA: Diagnosis not present

## 2023-06-06 DIAGNOSIS — T8859XA Other complications of anesthesia, initial encounter: Secondary | ICD-10-CM | POA: Insufficient documentation

## 2023-06-06 NOTE — Progress Notes (Signed)
Candace Frey 72 y.o. female  Initial Psychosocial Assessment  Pt psychosocial assessment reveals pt lives with their spouse. Pt is currently retired. Pt hobbies include spending time with others, gardening, and walking outside . Pt reports her stress level is low. Areas of stress/anxiety include family .  Pt does not exhibit signs of depression. Signs of depression include  stress  and fatigue. Pt shows good  coping skills with positive outlook . Offered emotional support and reassurance. Monitor and evaluate progress toward psychosocial goal(s).  Goal(s): Improved management of stress Improved coping skills Help patient work toward returning to meaningful activities that improve patient's QOL and are attainable with patient's lung disease   06/06/2023 11:23 AM

## 2023-06-06 NOTE — Progress Notes (Signed)
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 (Patient not taking: Reported on 05/25/2023), Disp: 1 each, Rfl: 5   Budeson-Glycopyrrol-Formoterol (BREZTRI AEROSPHERE) 160-9-4.8 MCG/ACT AERO, Inhale 2 puffs into the lungs in the morning and at bedtime. (Patient not taking: Reported on 05/25/2023), Disp: 5.9 g, Rfl: 0   Calcium Carbonate-Vitamin D (CALCIUM + D PO), Take 1 tablet by mouth daily. , Disp: , Rfl:    Estradiol 0.75 MG/1.25 GM (0.06%) topical gel, Place 1.25 g onto the skin daily., Disp: , Rfl:    levothyroxine (SYNTHROID) 100 MCG tablet, Take 1 tablet (100 mcg total) by mouth daily., Disp: 90 tablet, Rfl: 3   Multiple Vitamins-Minerals (PRESERVISION AREDS 2 PO), Take 1 tablet by mouth 2 (two) times daily., Disp: , Rfl:   Past Medical History: Past Medical History:  Diagnosis Date   Acute deep vein thrombosis (DVT) of distal vein of right lower extremity (HCC) 07/22/2016   Acute medial meniscal tear 01/29/2015   Chicken pox    Chronic obstructive lung disease (HCC) 05/23/2019   Colon polyps    Complication of anesthesia    slow to  wake up    COPD (chronic obstructive pulmonary disease) (HCC)    Genital warts    Multiple pulmonary nodules determined by computed tomography of lung 07/05/2017   CT 03/03/17  Right lower lobe nodularity described on the prior exam is less apparent today, favored to represent an area of scarring. other tiny nodules described on the prior are not readily apparent. No enlarging or dominant nodule identified > rec continue low dose annual  screening until 2023 per PCP (pt has fm hx lung ca)    Osteopenia    Shortness of breath dyspnea    with exertion was a smoker for many year per patient    Thyroid disease    UTI (urinary tract infection)     Tobacco Use: Social History   Tobacco Use  Smoking Status Former   Current packs/day: 0.00   Average packs/day: 2.0 packs/day for 43.0 years (86.0 ttl pk-yrs)   Types: Cigarettes   Start date: 11/23/1963   Quit date: 11/22/2006   Years since quitting: 16.5  Smokeless Tobacco Never    Labs: Review Flowsheet       Latest Ref Rng & Units 07/14/2020 12/25/2021 01/11/2023 01/17/2023  Labs for ITP Cardiac and Pulmonary Rehab  Cholestrol 0 - 200 mg/dL 409  811  914  -  LDL (calc) 0 - 99 mg/dL 75  83  78  -  HDL-C >78.29 mg/dL 81  56.21  30.86  -  Trlycerides 0.0 - 149.0 mg/dL 75  578.4  69.6  -  Hemoglobin A1c 4.6 - 6.5 % - - - 5.6     Details            Capillary Blood Glucose: No results found for: "GLUCAP"   Pulmonary Assessment Scores:  Pulmonary Assessment Scores     Row Name 06/06/23 1115         ADL UCSD   SOB Score total 28       CAT Score   CAT Score 8       mMRC Score   mMRC Score 2             UCSD: Self-administered rating of dyspnea associated with activities of daily living (ADLs) 6-point scale (0 = "not at all" to 5 = "maximal or unable to do because of breathlessness")  Scoring Scores range from 0 to 120.  Minimally important difference is 5 units  CAT: CAT can identify the health impairment of COPD  patients and is better correlated with disease progression.  CAT has a scoring range of zero to 40. The CAT score is classified into four groups of low (less than 10), medium (10 - 20), high (21-30) and very high (31-40) based on the impact level of disease on health status. A CAT score over 10 suggests significant symptoms.  A worsening CAT score could be explained by an exacerbation, poor medication adherence, poor inhaler technique, or progression of COPD or comorbid conditions.  CAT MCID is 2 points  mMRC: mMRC (Modified Medical Research Council) Dyspnea Scale is used to assess the degree of baseline functional disability in patients of respiratory disease due to dyspnea. No minimal important difference is established. A decrease in score of 1 point or greater is considered a positive change.   Pulmonary Function Assessment:  Pulmonary Function Assessment - 06/06/23 1100       Breath   Bilateral Breath Sounds Decreased    Shortness of Breath Yes;Fear of Shortness of Breath;Limiting activity             Exercise Target Goals: Exercise Program Goal: Individual exercise prescription set using results from initial 6 min walk test and THRR while considering  patient's activity barriers and safety.   Exercise Prescription Goal: Initial exercise prescription builds to 30-45 minutes a day of aerobic activity, 2-3 days per week.  Home exercise guidelines will be given to patient during program as part of exercise prescription that the participant will acknowledge.  Activity Barriers & Risk Stratification:  Activity Barriers & Cardiac Risk Stratification - 06/06/23 1054       Activity Barriers & Cardiac Risk Stratification   Activity Barriers Other (comment);Right Knee Replacement;Muscular Weakness;Deconditioning;Shortness of Breath    Comments Right hip pain             6 Minute Walk:  6 Minute Walk     Row Name 06/06/23 1212         6 Minute Walk   Phase Initial      Distance 1290 feet     Walk Time 6 minutes     # of Rest Breaks 0     MPH 2.44     METS 3.11     RPE 13     Perceived Dyspnea  2.5     VO2 Peak 10.87     Symptoms No     Resting HR 73 bpm     Resting BP 112/64     Resting Oxygen Saturation  98 %     Exercise Oxygen Saturation  during 6 min walk 93 %     Max Ex. HR 121 bpm     Max Ex. BP 142/68     2 Minute Post BP 120/68       Interval HR   1 Minute HR 101     2 Minute HR 111     3 Minute HR 111     4 Minute HR 121     5 Minute HR 118     6 Minute HR 117     2 Minute Post HR 84     Interval Heart Rate? Yes       Interval Oxygen   Interval Oxygen? Yes     Baseline Oxygen Saturation % 98 %     1 Minute Oxygen Saturation % 97 %     1 Minute Liters of Oxygen 0 L     2 Minute Oxygen Saturation % 96 %     2 Minute Liters of Oxygen 0 L     3 Minute Oxygen Saturation % 95 %     3 Minute Liters of Oxygen 0 L     4 Minute Oxygen Saturation % 93 %     4 Minute Liters of Oxygen 0 L     5 Minute Oxygen Saturation % 94 %     5 Minute Liters of Oxygen 0 L     6 Minute Oxygen Saturation % 97 %     6 Minute Liters of Oxygen 0 L     2 Minute Post Oxygen Saturation % 99 %     2 Minute Post Liters of Oxygen 0 L              Oxygen Initial Assessment:  Oxygen Initial Assessment - 06/06/23 1059       Home Oxygen   Home Oxygen Device None    Sleep Oxygen Prescription None    Home Exercise Oxygen Prescription None    Home Resting Oxygen Prescription None      Initial 6 min Walk   Oxygen Used None      Program Oxygen Prescription   Program Oxygen Prescription None      Intervention   Short Term Goals To learn and understand importance of maintaining oxygen saturations>88%;To learn and understand importance of monitoring SPO2 with pulse oximeter and demonstrate accurate use of the pulse oximeter.;To learn and demonstrate proper pursed lip breathing techniques or other breathing techniques. ;To learn and demonstrate proper  use of respiratory medications    Long  Term Goals Maintenance of O2 saturations>88%;Verbalizes importance of monitoring SPO2 with pulse oximeter and return demonstration;Exhibits proper breathing techniques, such as pursed lip breathing or other method taught during program session             Oxygen Re-Evaluation:   Oxygen Discharge (Final Oxygen Re-Evaluation):   Initial Exercise Prescription:  Initial Exercise Prescription - 06/06/23 1200       Date of Initial Exercise RX and Referring Provider   Date 06/06/23    Referring Provider Icard    Expected Discharge Date 09/01/23      Treadmill   MPH 2    Grade 0    Minutes 15      Recumbant Elliptical   Level 1    Minutes 15    METs 2      Prescription Details   Frequency (times per week) 2    Duration Progress to 30 minutes of continuous aerobic without signs/symptoms of physical  distress      Intensity   THRR 40-80% of Max Heartrate 58-117    Ratings of Perceived Exertion 11-13    Perceived Dyspnea 0-4      Progression   Progression Continue to progress workloads to maintain intensity without signs/symptoms of physical distress.      Resistance Training   Training Prescription Yes    Weight blue bands    Reps 10-15             Perform Capillary Blood Glucose checks as needed.  Exercise Prescription Changes:   Exercise Comments:   Exercise Goals and Review:   Exercise Goals     Row Name 06/06/23 1055             Exercise Goals   Increase Physical Activity Yes       Intervention Provide advice, education, support and counseling about physical activity/exercise needs.;Develop an individualized exercise prescription for aerobic and resistive training based on initial evaluation findings, risk stratification, comorbidities and participant's personal goals.       Expected Outcomes Long Term: Exercising regularly at least 3-5 days a week.;Short Term: Attend rehab on a regular basis to increase  amount of physical activity.;Long Term: Add in home exercise to make exercise part of routine and to increase amount of physical activity.       Increase Strength and Stamina Yes       Intervention Provide advice, education, support and counseling about physical activity/exercise needs.;Develop an individualized exercise prescription for aerobic and resistive training based on initial evaluation findings, risk stratification, comorbidities and participant's personal goals.       Expected Outcomes Short Term: Increase workloads from initial exercise prescription for resistance, speed, and METs.;Short Term: Perform resistance training exercises routinely during rehab and add in resistance training at home;Long Term: Improve cardiorespiratory fitness, muscular endurance and strength as measured by increased METs and functional capacity ( )       Able to understand and use rate of perceived exertion (RPE) scale Yes       Intervention Provide education and explanation on how to use RPE scale       Expected Outcomes Short Term: Able to use RPE daily in rehab to express subjective intensity level;Long Term:  Able to use RPE to guide intensity level when exercising independently       Able to understand and use Dyspnea scale Yes       Intervention Provide education and explanation on how to use Dyspnea scale       Expected Outcomes Short Term: Able to use Dyspnea scale daily in rehab to express subjective sense of shortness of breath during exertion;Long Term: Able to use Dyspnea scale to guide intensity level when exercising independently       Knowledge and understanding of Target Heart Rate Range (THRR) Yes       Intervention Provide education and explanation of THRR including how the numbers were predicted and where they are located for reference       Expected Outcomes Short Term: Able to state/look up THRR;Short Term: Able to use daily as guideline for intensity in rehab;Long Term: Able to use THRR to  govern intensity when exercising independently       Understanding of Exercise Prescription Yes       Intervention Provide education, explanation, and written materials on patient's individual exercise prescription       Expected Outcomes Short Term: Able to explain program exercise prescription;Long Term: Able to explain home  exercise prescription to exercise independently                Exercise Goals Re-Evaluation :   Discharge Exercise Prescription (Final Exercise Prescription Changes):   Nutrition:  Target Goals: Understanding of nutrition guidelines, daily intake of sodium 1500mg , cholesterol 200mg , calories 30% from fat and 7% or less from saturated fats, daily to have 5 or more servings of fruits and vegetables.  Biometrics:  Pre Biometrics - 06/06/23 1041       Pre Biometrics   Grip Strength 22 kg              Nutrition Therapy Plan and Nutrition Goals:   Nutrition Assessments:  MEDIFICTS Score Key: ?70 Need to make dietary changes  40-70 Heart Healthy Diet ? 40 Therapeutic Level Cholesterol Diet   Picture Your Plate Scores: <54 Unhealthy dietary pattern with much room for improvement. 41-50 Dietary pattern unlikely to meet recommendations for good health and room for improvement. 51-60 More healthful dietary pattern, with some room for improvement.  >60 Healthy dietary pattern, although there may be some specific behaviors that could be improved.    Nutrition Goals Re-Evaluation:   Nutrition Goals Discharge (Final Nutrition Goals Re-Evaluation):   Psychosocial: Target Goals: Acknowledge presence or absence of significant depression and/or stress, maximize coping skills, provide positive support system. Participant is able to verbalize types and ability to use techniques and skills needed for reducing stress and depression.  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 Yes    Comments Has good support from daughter      Barriers   Psychosocial barriers to participate in program The patient should benefit from training in stress management and relaxation.      Screening Interventions   Interventions Encouraged to exercise    Expected Outcomes Long Term Goal: Stressors or current issues are controlled or eliminated.;Long Term goal: The participant improves quality of Life and PHQ9 Scores as seen by post scores and/or verbalization of changes             Quality of Life Scores:  Scores of 19 and below usually indicate a poorer quality of life in these areas.  A difference of  2-3 points is a clinically meaningful difference.  A difference of 2-3 points in the total score of the Quality of Life Index has been associated with significant improvement in overall quality of life, self-image, physical symptoms, and general health in studies assessing change in quality of life.  PHQ-9: Review Flowsheet  More data exists      06/06/2023 01/11/2023 12/30/2022 12/25/2021 10/20/2021  Depression screen PHQ 2/9  Decreased Interest 0 0 0 0 0  Down, Depressed, Hopeless 0 0 0 0 0  PHQ - 2 Score 0 0 0 0 0  Altered sleeping 0 0 - 0 -  Tired, decreased energy 1 0 - 0 -  Change in appetite 1 1 - 0 -  Feeling bad or failure about yourself  0 0 - 0 -  Trouble concentrating 0 0 - 0 -  Moving slowly or fidgety/restless 0 0 - 0 -  Suicidal thoughts 0 0 - 0 -  PHQ-9  Score 2 1 - 0 -  Difficult doing work/chores Not difficult at all Not difficult at all - Not difficult at all -    Details           Interpretation of Total Score  Total Score Depression Severity:  1-4 = Minimal depression, 5-9 = Mild depression, 10-14 = Moderate depression, 15-19 = Moderately severe depression,  20-27 = Severe depression   Psychosocial Evaluation and Intervention:  Psychosocial Evaluation - 06/06/23 1101       Psychosocial Evaluation & Interventions   Interventions Relaxation education;Encouraged to exercise with the program and follow exercise prescription    Comments Pt says she sometimes gets stress about taking care of her husband with dementia    Expected Outcomes For Lou to decrease her stress and attend PR without any psychosocial barriers or concerns    Continue Psychosocial Services  No Follow up required             Psychosocial Re-Evaluation:   Psychosocial Discharge (Final Psychosocial Re-Evaluation):   Education: Education Goals: Education classes will be provided on a weekly basis, covering required topics. Participant will state understanding/return demonstration of topics presented.  Learning Barriers/Preferences:   Education Topics: Introduction to Pulmonary Rehab Group instruction provided by PowerPoint, verbal discussion, and written material to support subject matter. Instructor reviews what Pulmonary Rehab is, the purpose of the program, and how patients are referred.     Know Your Numbers Group instruction that is supported by a PowerPoint presentation. Instructor discusses importance of knowing and understanding resting, exercise, and post-exercise oxygen saturation, heart rate, and blood pressure. Oxygen saturation, heart rate, blood pressure, rating of perceived exertion, and dyspnea are reviewed along with a normal range for these values.    Exercise for the Pulmonary Patient Group instruction that is supported by a PowerPoint presentation. Instructor discusses benefits of exercise, core components of exercise, frequency, duration, and intensity of an exercise routine, importance of utilizing pulse oximetry during exercise, safety while exercising, and options of places to exercise outside of rehab.    MET Level  Group instruction  provided by PowerPoint, verbal discussion, and written material to support subject matter. Instructor reviews what METs are and how to increase METs.    Pulmonary Medications Verbally interactive group education provided by instructor with focus on inhaled medications and proper administration.   Anatomy and Physiology of the Respiratory System Group instruction provided by PowerPoint, verbal discussion, and written material to support subject matter. Instructor reviews respiratory cycle and anatomical components of the respiratory system and their functions. Instructor also reviews differences in obstructive and restrictive respiratory diseases with examples of each.    Oxygen Safety Group instruction provided by PowerPoint, verbal discussion, and written material to support subject matter. There is an overview of "What is Oxygen" and "Why do we need it".  Instructor also reviews how to create a safe environment for oxygen use, the importance of using oxygen as prescribed, and the risks of noncompliance. There is a brief discussion on traveling with oxygen and resources the patient may utilize.   Oxygen Use Group instruction provided by PowerPoint, verbal discussion, and written material to discuss how supplemental oxygen is prescribed and different types of oxygen supply systems. Resources for more information are provided.    Breathing Techniques Group instruction that is supported by demonstration and informational handouts. Instructor discusses the benefits of pursed lip and diaphragmatic breathing and detailed demonstration on how to perform both.     Risk Factor Reduction  Group instruction that is supported by a PowerPoint presentation. Instructor discusses the definition of a risk factor, different risk factors for pulmonary disease, and how the heart and lungs work together.   MD Day A group question and answer session with a medical doctor that allows participants to ask  questions that relate to their pulmonary disease state.   Nutrition for the Pulmonary Patient Group instruction provided by PowerPoint slides, verbal discussion, and written materials to support subject matter. The instructor gives an explanation and review of healthy diet recommendations, which includes a discussion on weight management, recommendations for fruit and vegetable consumption, as well as protein, fluid, caffeine, fiber, sodium, sugar, and alcohol. Tips for eating when patients are short of breath are discussed.    Other Education Group or individual verbal, written, or video instructions that support the educational goals of the pulmonary rehab program.    Knowledge Questionnaire Score:  Knowledge Questionnaire Score - 06/06/23 1120       Knowledge Questionnaire Score   Pre Score 16/18             Core Components/Risk Factors/Patient Goals at Admission:  Personal Goals and Risk Factors at Admission - 06/06/23 1101       Core Components/Risk Factors/Patient Goals on Admission    Weight Management Weight Loss    Improve shortness of breath with ADL's Yes    Intervention Provide education, individualized exercise plan and daily activity instruction to help decrease symptoms of SOB with activities of daily living.    Expected Outcomes Long Term: Be able to perform more ADLs without symptoms or delay the onset of symptoms;Short Term: Improve cardiorespiratory fitness to achieve a reduction of symptoms when performing ADLs             Core Components/Risk Factors/Patient Goals Review:    Core Components/Risk Factors/Patient Goals at Discharge (Final Review):    ITP Comments:   Comments: Dr. Mechele Collin is Medical Director for Pulmonary Rehab at [facility].

## 2023-06-06 NOTE — Progress Notes (Signed)
Candace Frey 72 y.o. female  Pulmonary Rehab Orientation Note  This patient who was referred to Pulmonary Rehab by Dr. Tonia Brooms with the diagnosis of COPD 1 arrived today in Cardiac and Pulmonary Rehab. She  arrived ambulatory with normal gait. She  does not carry portable oxygen. Per patient, Candace Frey uses oxygen never. Color good, skin warm and dry. Patient is oriented to time and place. Patient's medical history, psychosocial health, and medications reviewed.   Pt's psychosocial assessment reveals pt lives with their spouse. Pt is currently retired. Pt hobbies include spending time with others, gardening, and walking outside. Pt reports her stress level is low. Areas of stress/anxiety include family. Pt does not exhibit signs of depression. Signs of depression include stress and fatigue. Pt shows good  coping skills with positive outlook. Offered emotional support and reassurance. PHQ2/9 score 0/2. Will continue to monitor and evaluate progress toward psychosocial goal(s) of decreased stress.   Physical assessment reveals heart rate is normal, breath sounds clear to auscultation, no wheezes, rales, or rhonchi. Grip strength equal, strong. Distal pulses present. Candace Frey reports she does take medications as prescribed. Patient states she follows a regular  diet. The patient has been trying to lose weight through a healthy diet and exercise program. Pt's weight will be monitored closely.   Demonstration and practice of PLB using pulse oximeter. Candace Frey able to return demonstration satisfactorily. Safety and hand hygiene in the exercise area reviewed with patient. Candace Frey voices understanding of the information reviewed. Department expectations discussed with patient and achievable goals were set. The patient shows enthusiasm about attending the program and we look forward to working with Candace Frey. Candace Frey completed a 6 min walk test today and is scheduled to begin exercise on 06/14/2023 at 1015.    [id number] Candace Hart, RN, BSN

## 2023-06-14 ENCOUNTER — Encounter (HOSPITAL_COMMUNITY): Payer: Self-pay

## 2023-06-14 ENCOUNTER — Encounter (HOSPITAL_COMMUNITY)
Admission: RE | Admit: 2023-06-14 | Discharge: 2023-06-14 | Disposition: A | Discharge status: Home / Self Care | Payer: Medicare Other | Source: Ambulatory Visit | Attending: Pulmonary Disease | Admitting: Pulmonary Disease

## 2023-06-14 VITALS — Wt 172.8 lb

## 2023-06-14 DIAGNOSIS — J449 Chronic obstructive pulmonary disease, unspecified: Secondary | ICD-10-CM

## 2023-06-14 NOTE — Progress Notes (Signed)
Daily Session Note  Patient Details  Name: KALAYNA NOY MRN: [id number] Date of Birth: [birth year] Referring Provider:   [name] Pulmonary Rehab Walk Test from 06/06/2023 in [facility]  Referring Provider Icard       Encounter Date: 06/14/2023  Check In:  Session Check In - 06/14/23 1004       Check-In   Supervising physician immediately available to respond to emergencies [facility] MD immediately available    Physician(s) Edd Fabian, NP    Location [facility]    Staff Present Essie Hart, RN, Doris Cheadle, MS, ACSM-CEP, Exercise Physiologist;Casey Katrinka Blazing, RT    Virtual Visit No    Medication changes reported     Yes    Comments Reviewed Med List    Fall or balance concerns reported    No    Tobacco Cessation No Change    Warm-up and Cool-down Not performed (comment)   only   Resistance Training Performed No    PAD/SET Patient? No      Pain Assessment   Currently in Pain? No/denies    Multiple Pain Sites No             Capillary Blood Glucose: No results found for this or any previous visit (from the past 24 hour(s)).   Exercise Prescription Changes - 06/14/23 1200       Response to Exercise   Blood Pressure (Admit) 114/60    Blood Pressure (Exercise) 124/66    Blood Pressure (Exit) 122/70    Heart Rate (Admit) 71 bpm    Heart Rate (Exercise) 93 bpm    Heart Rate (Exit) 79 bpm    Oxygen Saturation (Admit) 98 %    Oxygen Saturation (Exercise) 96 %    Oxygen Saturation (Exit) 96 %    Rating of Perceived Exertion (Exercise) 11    Perceived Dyspnea (Exercise) 1    Duration Progress to 30 minutes of  aerobic without signs/symptoms of physical distress    Intensity THRR unchanged      Progression   Progression Continue to progress workloads to maintain intensity without signs/symptoms of physical distress.      Resistance Training   Training Prescription Yes     Weight blue bands    Reps 10-15    Time 10 Minutes      Treadmill   MPH 2    Grade 0    Minutes 15    METs 2.53      Recumbant Elliptical   Level 1    Minutes 15    METs 2.3             Social History   Tobacco Use  Smoking Status Former   Current packs/day: 0.00   Average packs/day: 2.0 packs/day for 43.0 years (86.0 ttl pk-yrs)   Types: Cigarettes   Start date: 11/23/1963   Quit date: 11/22/2006   Years since quitting: 16.5  Smokeless Tobacco Never    Goals Met:  Exercise tolerated well No report of concerns or symptoms today Strength training completed today  Goals Unmet:  Not Applicable  Comments: Service time is from 1027 to 1149    Dr. Mechele Collin is Medical Director for Pulmonary Rehab at [facility].

## 2023-06-16 ENCOUNTER — Encounter (HOSPITAL_COMMUNITY)
Admission: RE | Admit: 2023-06-16 | Discharge: 2023-06-16 | Disposition: A | Discharge status: Home / Self Care | Payer: Medicare Other | Source: Ambulatory Visit | Attending: Pulmonary Disease | Admitting: Pulmonary Disease

## 2023-06-16 DIAGNOSIS — J449 Chronic obstructive pulmonary disease, unspecified: Secondary | ICD-10-CM | POA: Diagnosis not present

## 2023-06-16 NOTE — Progress Notes (Signed)
Daily Session Note  Patient Details  Name: Candace Frey MRN: [id number] Date of Birth: 21-May-1951 Referring Provider:   [name] Pulmonary Rehab Walk Test from 06/06/2023 in [facility], [facility], & [facility]  Referring Provider Icard       Encounter Date: 06/16/2023  Check In:  Session Check In - 06/16/23 1204       Check-In   Supervising physician immediately available to respond to emergencies [facility] immediately available    Physician(s) Eligha Bridegroom, NP    [facility]    Staff Present Raford Pitcher, MS, ACSM-CEP, Exercise Physiologist;Bary Limbach Marcille Buffy, RN, BSN;Jetta Walker BS, ACSM-CEP, Exercise Physiologist    Virtual Visit No    Medication changes reported     No    Fall or balance concerns reported    No    Tobacco Cessation No Change    Warm-up and Cool-down Performed as group-led instruction    Resistance Training Performed Yes    VAD Patient? No    PAD/SET Patient? No      Pain Assessment   Currently in Pain? No/denies    Multiple Pain Sites No             Capillary Blood Glucose: No results found for this or any previous visit (from the past 24 hour(s)).    Social History   Tobacco Use  Smoking Status Former   Current packs/day: 0.00   Average packs/day: 2.0 packs/day for 43.0 years (86.0 ttl pk-yrs)   Types: Cigarettes   Start date: 11/23/1963   Quit date: 11/22/2006   Years since quitting: 16.5  Smokeless Tobacco Never    Goals Met:  Proper associated with RPD/PD & O2 Sat Independence with exercise equipment Exercise tolerated well No report of concerns or symptoms today Strength training completed today  Goals Unmet:  Not Applicable  Comments: Service time is from 1010 to 1146.    Dr. Mechele Collin is Medical Director for Pulmonary Rehab at [facility].

## 2023-06-20 DIAGNOSIS — M546 Pain in thoracic spine: Secondary | ICD-10-CM | POA: Diagnosis not present

## 2023-06-20 DIAGNOSIS — M542 Cervicalgia: Secondary | ICD-10-CM | POA: Diagnosis not present

## 2023-06-21 ENCOUNTER — Encounter (HOSPITAL_COMMUNITY)
Admission: RE | Admit: 2023-06-21 | Discharge: 2023-06-21 | Disposition: A | Discharge status: Home / Self Care | Payer: Medicare Other | Source: Ambulatory Visit | Attending: Pulmonary Disease | Admitting: Pulmonary Disease

## 2023-06-21 DIAGNOSIS — J449 Chronic obstructive pulmonary disease, unspecified: Secondary | ICD-10-CM | POA: Diagnosis not present

## 2023-06-21 NOTE — Progress Notes (Signed)
Daily Session Note  Patient Details  Name: Candace Frey MRN: [id number] Date of Birth: 1951-02-07 Referring Provider:   [name] Pulmonary Rehab Walk Test from 06/06/2023 in [facility], [facility]  Referring Provider Icard       Encounter Date: 06/21/2023  Check In:  Session Check In - 06/21/23 1229       Check-In   Supervising physician immediately available to respond to emergencies [facility] immediately available    Physician(s) Robin Searing, NP    [facility]    Staff Present Durel Salts, Patriciaann Clan, RN, Doris Cheadle, MS, ACSM-CEP, Exercise Physiologist    Virtual Visit No    Medication changes reported     No    Fall or balance concerns reported    No    Tobacco Cessation No Change    Warm-up and Cool-down Performed as group-led instruction    Resistance Training Performed Yes    VAD Patient? No    PAD/SET Patient? No      Pain Assessment   Currently in Pain? No/denies    Multiple Pain Sites No             Capillary Blood Glucose: No results found for this or any previous visit (from the past 24 hour(s)).    Social History   Tobacco Use  Smoking Status Former   Current packs/day: 0.00   Average packs/day: 2.0 packs/day for 43.0 years (86.0 ttl pk-yrs)   Types: Cigarettes   Start date: 11/23/1963   Quit date: 11/22/2006   Years since quitting: 16.5  Smokeless Tobacco Never    Goals Met:  Exercise tolerated well No report of concerns or symptoms today Strength training completed today  Goals Unmet:  Not Applicable  Comments: Service time is from 1015 to 1149    Dr. Mechele Collin is Medical Director for Pulmonary Rehab at [facility].

## 2023-06-22 ENCOUNTER — Encounter (INDEPENDENT_AMBULATORY_CARE_PROVIDER_SITE_OTHER): Payer: Self-pay

## 2023-06-23 ENCOUNTER — Encounter (HOSPITAL_COMMUNITY)
Admission: RE | Admit: 2023-06-23 | Discharge: 2023-06-23 | Disposition: A | Discharge status: Home / Self Care | Payer: Medicare Other | Source: Ambulatory Visit | Attending: Pulmonary Disease | Admitting: Pulmonary Disease

## 2023-06-23 DIAGNOSIS — J449 Chronic obstructive pulmonary disease, unspecified: Secondary | ICD-10-CM | POA: Insufficient documentation

## 2023-06-23 NOTE — Progress Notes (Signed)
Daily Session Note  Patient Details  Name: Candace Frey MRN: [id number] Date of Birth: [birth year] Referring Provider:   [name] Pulmonary Rehab Walk Test from 06/06/2023 in [facility], Vascular, & Lung [facility]  Referring Provider Icard       Encounter Date: 06/23/2023  Check In:  Session Check In - 06/23/23 1146       Check-In   Supervising physician immediately available to respond to emergencies [facility] immediately available    Physician(s) Carlyon Shadow, NP    [facility]    Staff Present Samantha Belarus, RD, LDN;Randi Dionisio Paschal, ACSM-CEP, Exercise Physiologist;Jullie Arps Gerre Scull, RN, BSN;Casey Smith, RT;Jetta Walker BS, ACSM-CEP, Exercise Physiologist    Virtual Visit No    Medication changes reported     No    Fall or balance concerns reported    No    Tobacco Cessation No Change    Warm-up and Cool-down Performed as group-led instruction    Resistance Training Performed Yes    VAD Patient? No    PAD/SET Patient? No      Pain Assessment   Currently in Pain? No/denies    Multiple Pain Sites No             Capillary Blood Glucose: No results found for this or any previous visit (from the past 24 hour(s)).    Social History   Tobacco Use  Smoking Status Former   Current packs/day: 0.00   Average packs/day: 2.0 packs/day for 43.0 years (86.0 ttl pk-yrs)   Types: Cigarettes   Start date: 11/23/1963   Quit date: 11/22/2006   Years since quitting: 16.5  Smokeless Tobacco Never    Goals Met:  Exercise tolerated well No report of concerns or symptoms today Strength training completed today  Goals Unmet:  Not Applicable  Comments: Service time is from 1008 to 1152    Dr. Mechele Collin is Medical Director for Pulmonary Rehab at [facility].

## 2023-06-28 ENCOUNTER — Encounter (HOSPITAL_COMMUNITY)
Admission: RE | Admit: 2023-06-28 | Discharge: 2023-06-28 | Disposition: A | Discharge status: Home / Self Care | Payer: Medicare Other | Source: Ambulatory Visit | Attending: Pulmonary Disease | Admitting: Pulmonary Disease

## 2023-06-28 VITALS — Wt 172.4 lb

## 2023-06-28 DIAGNOSIS — J449 Chronic obstructive pulmonary disease, unspecified: Secondary | ICD-10-CM

## 2023-06-28 NOTE — Progress Notes (Signed)
Daily Session Note  Patient Details  Name: Candace Frey MRN: [id number] Date of Birth: June 30, 1951 Referring Provider:   [name] Pulmonary Rehab Walk Test from 06/06/2023 in [facility], [facility]  Referring Provider Icard       Encounter Date: 06/28/2023  Check In:  Session Check In - 06/28/23 1208       Check-In   Supervising physician immediately available to respond to emergencies [facility] immediately available    Physician(s) Carlyon Shadow, NP    Location [facility]    Staff Present Raford Pitcher, MS, ACSM-CEP, Exercise Physiologist;Randi Dionisio Paschal, ACSM-CEP, Exercise Physiologist;Samantha Belarus, RD, Dutch Gray, RN, BSN;Johnny Porter, MS, Exercise Physiologist; Katrinka Blazing, RT    Virtual Visit No    Medication changes reported     No    Fall or balance concerns reported    No    Tobacco Cessation No Change    Warm-up and Cool-down Performed as group-led instruction    Resistance Training Performed Yes    VAD Patient? No    PAD/SET Patient? No      Pain Assessment   Currently in Pain? No/denies    Multiple Pain Sites No             Capillary Blood Glucose: No results found for this or any previous visit (from the past 24 hour(s)).   Exercise Prescription Changes - 06/28/23 1200       Response to Exercise   Blood Pressure (Admit) 100/58    Blood Pressure (Exercise) 132/68    Blood Pressure (Exit) 96/58    Heart Rate (Admit) 76 bpm    Heart Rate (Exercise) 92 bpm    Heart Rate (Exit) 86 bpm    Oxygen Saturation (Admit) 97 %    Oxygen Saturation (Exercise) 95 %    Oxygen Saturation (Exit) 95 %    Rating of Perceived Exertion (Exercise) 9    Perceived Dyspnea (Exercise) 0    Duration Continue with 30 min of aerobic exercise without signs/symptoms of physical distress.    Intensity THRR unchanged      Progression   Progression Continue to progress workloads to maintain  intensity without signs/symptoms of physical distress.      Resistance Training   Training Prescription Yes    Weight blue bands    Reps 10-15    Time 10 Minutes      Treadmill   MPH 2.8    Grade 3    Minutes 15    METs 4.3      Recumbant Elliptical   Level 2    Minutes 15    METs 3.4             Social History   Tobacco Use  Smoking Status Former   Current packs/day: 0.00   Average packs/day: 2.0 packs/day for 43.0 years (86.0 ttl pk-yrs)   Types: Cigarettes   Start date: 11/23/1963   Quit date: 11/22/2006   Years since quitting: 16.6  Smokeless Tobacco Never    Goals Met:  Proper associated with RPD/PD & O2 Sat Independence with exercise equipment Exercise tolerated well No report of concerns or symptoms today Strength training completed today  Goals Unmet:  Not Applicable  Comments: Service time is from 1004 to 1137.    Dr. Mechele Collin is Medical Director for Pulmonary Rehab at [facility].

## 2023-06-29 DIAGNOSIS — M546 Pain in thoracic spine: Secondary | ICD-10-CM | POA: Diagnosis not present

## 2023-06-29 DIAGNOSIS — M542 Cervicalgia: Secondary | ICD-10-CM | POA: Diagnosis not present

## 2023-06-29 NOTE — Progress Notes (Signed)
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 (Patient not taking: Reported on 05/25/2023), Disp: 1 each, Rfl: 5   Budeson-Glycopyrrol-Formoterol (BREZTRI AEROSPHERE) 160-9-4.8 MCG/ACT AERO, Inhale 2 puffs into the lungs in the morning and at bedtime. (Patient not taking: Reported on 05/25/2023), Disp: 5.9 g, Rfl: 0   Calcium Carbonate-Vitamin D (CALCIUM + D PO), Take 1 tablet by mouth daily. , Disp: , Rfl:    Estradiol 0.75 MG/1.25 GM (0.06%) topical gel, Place 1.25 g onto the skin daily., Disp: , Rfl:    levothyroxine (SYNTHROID) 100 MCG tablet, Take 1 tablet (100 mcg total) by mouth daily., Disp: 90 tablet, Rfl: 3   Multiple Vitamins-Minerals (PRESERVISION AREDS 2 PO), Take 1 tablet by mouth 2 (two) times daily., Disp: , Rfl:   Past Medical History: Past Medical History:  Diagnosis Date   Acute deep vein thrombosis (DVT) of distal vein of right lower extremity (HCC) 07/22/2016   Acute medial meniscal tear 01/29/2015   Chicken pox    Chronic obstructive lung disease (HCC) 05/23/2019   Colon polyps    Complication of anesthesia    slow to  wake up    COPD (chronic obstructive pulmonary disease) (HCC)    Genital warts    Multiple pulmonary nodules determined by computed tomography of lung 07/05/2017   CT 03/03/17  Right lower lobe nodularity described on the prior exam is less apparent today, favored to represent an area of scarring. other tiny nodules described on the prior are not readily apparent. No enlarging or dominant nodule identified > rec continue low dose annual  screening until 2023 per PCP (pt has fm hx lung ca)    Osteopenia    Shortness of breath dyspnea    with exertion was a smoker for many year per patient    Thyroid disease    UTI (urinary tract infection)     Tobacco Use: Social History   Tobacco Use  Smoking Status Former   Current packs/day: 0.00   Average packs/day: 2.0 packs/day for 43.0 years (86.0 ttl pk-yrs)   Types: Cigarettes   Start date: 11/23/1963   Quit date: 11/22/2006   Years since quitting: 16.6  Smokeless Tobacco Never    Labs: Review Flowsheet       Latest Ref Rng & Units 07/14/2020 12/25/2021 01/11/2023 01/17/2023  Labs for ITP Cardiac and Pulmonary Rehab  Cholestrol 0 - 200 mg/dL 696  295  284  -  LDL (calc) 0 - 99 mg/dL 75  83  78  -  HDL-C >13.24 mg/dL 81  40.10  27.25  -  Trlycerides 0.0 - 149.0 mg/dL 75  366.4  40.3  -  Hemoglobin A1c 4.6 - 6.5 % - - - 5.6     Details            Capillary Blood Glucose: No results found for: "GLUCAP"   Pulmonary Assessment Scores:  Pulmonary Assessment Scores     Row Name 06/06/23 1115         ADL UCSD   SOB Score total 28       CAT Score   CAT Score 8       mMRC Score   mMRC Score 2             UCSD: Self-administered rating of dyspnea associated with activities of daily living (ADLs) 6-point scale (0 = "not at all" to 5 = "maximal or unable to do because of breathlessness")  Scoring Scores range from 0 to 120.  Minimally important difference is 5 units  CAT: CAT can identify the health impairment of COPD  patients and is better correlated with disease progression.  CAT has a scoring range of zero to 40. The CAT score is classified into four groups of low (less than 10), medium (10 - 20), high (21-30) and very high (31-40) based on the impact level of disease on health status. A CAT score over 10 suggests significant symptoms.  A worsening CAT score could be explained by an exacerbation, poor medication adherence, poor inhaler technique, or progression of COPD or comorbid conditions.  CAT MCID is 2 points  mMRC: mMRC (Modified Medical Research Council) Dyspnea Scale is used to assess the degree of baseline functional disability in patients of respiratory disease due to dyspnea. No minimal important difference is established. A decrease in score of 1 point or greater is considered a positive change.   Pulmonary Function Assessment:  Pulmonary Function Assessment - 06/06/23 1100       Breath   Bilateral Breath Sounds Decreased    Shortness of Breath Yes;Fear of Shortness of Breath;Limiting activity             Exercise Target Goals: Exercise Program Goal: Individual exercise prescription set using results from initial 6 min walk test and THRR while considering  patient's activity barriers and safety.   Exercise Prescription Goal: Initial exercise prescription builds to 30-45 minutes a day of aerobic activity, 2-3 days per week.  Home exercise guidelines will be given to patient during program as part of exercise prescription that the participant will acknowledge.  Activity Barriers & Risk Stratification:  Activity Barriers & Cardiac Risk Stratification - 06/06/23 1054       Activity Barriers & Cardiac Risk Stratification   Activity Barriers Other (comment);Right Knee Replacement;Muscular Weakness;Deconditioning;Shortness of Breath    Comments Right hip pain             6 Minute Walk:  6 Minute Walk     Row Name 06/06/23 1212         6 Minute Walk   Phase Initial      Distance 1290 feet     Walk Time 6 minutes     # of Rest Breaks 0     MPH 2.44     METS 3.11     RPE 13     Perceived Dyspnea  2.5     VO2 Peak 10.87     Symptoms No     Resting HR 73 bpm     Resting BP 112/64     Resting Oxygen Saturation  98 %     Exercise Oxygen Saturation  during 6 min walk 93 %     Max Ex. HR 121 bpm     Max Ex. BP 142/68     2 Minute Post BP 120/68       Interval HR   1 Minute HR 101     2 Minute HR 111     3 Minute HR 111     4 Minute HR 121     5 Minute HR 118     6 Minute HR 117     2 Minute Post HR 84     Interval Heart Rate? Yes       Interval Oxygen   Interval Oxygen? Yes     Baseline Oxygen Saturation % 98 %     1 Minute Oxygen Saturation % 97 %     1 Minute Liters of Oxygen 0 L     2 Minute Oxygen Saturation % 96 %     2 Minute Liters of Oxygen 0 L     3 Minute Oxygen Saturation % 95 %     3 Minute Liters of Oxygen 0 L     4 Minute Oxygen Saturation % 93 %     4 Minute Liters of Oxygen 0 L     5 Minute Oxygen Saturation % 94 %     5 Minute Liters of Oxygen 0 L     6 Minute Oxygen Saturation % 97 %     6 Minute Liters of Oxygen 0 L     2 Minute Post Oxygen Saturation % 99 %     2 Minute Post Liters of Oxygen 0 L              Oxygen Initial Assessment:  Oxygen Initial Assessment - 06/06/23 1059       Home Oxygen   Home Oxygen Device None    Sleep Oxygen Prescription None    Home Exercise Oxygen Prescription None    Home Resting Oxygen Prescription None      Initial 6 min Walk   Oxygen Used None      Program Oxygen Prescription   Program Oxygen Prescription None      Intervention   Short Term Goals To learn and understand importance of maintaining oxygen saturations>88%;To learn and understand importance of monitoring SPO2 with pulse oximeter and demonstrate accurate use of the pulse oximeter.;To learn and demonstrate proper pursed lip breathing techniques or other breathing techniques. ;To learn and demonstrate proper  use of respiratory medications    Long  Term Goals Maintenance of O2 saturations>88%;Verbalizes importance of monitoring SPO2 with pulse oximeter and return demonstration;Exhibits proper breathing techniques, such as pursed lip breathing or other method taught during program session             Oxygen Re-Evaluation:  Oxygen Re-Evaluation     Row Name 06/17/23 1102             Program Oxygen Prescription   Program Oxygen Prescription None         Home Oxygen   Home Oxygen Device None       Sleep Oxygen Prescription None       Home Exercise Oxygen Prescription None       Home Resting Oxygen Prescription None         Goals/Expected Outcomes   Short Term Goals To learn and understand importance of maintaining oxygen saturations>88%;To learn and understand importance of monitoring SPO2 with pulse oximeter and demonstrate accurate use of the  pulse oximeter.;To learn and demonstrate proper pursed lip breathing techniques or other breathing techniques. ;To learn and demonstrate proper use of respiratory medications       Long  Term Goals Maintenance of O2 saturations>88%;Verbalizes importance of monitoring SPO2 with pulse oximeter and return demonstration;Exhibits proper breathing techniques, such as pursed lip breathing or other method taught during program session       Goals/Expected Outcomes Compliance and understanding of oxygen saturation monitoring and breathing techniques to decrease shortness of breath.                Oxygen Discharge (Final Oxygen Re-Evaluation):  Oxygen Re-Evaluation - 06/17/23 1102       Program Oxygen Prescription   Program Oxygen Prescription None      Home Oxygen   Home Oxygen Device None    Sleep Oxygen Prescription None    Home Exercise Oxygen Prescription None    Home Resting Oxygen Prescription None      Goals/Expected Outcomes   Short Term Goals To learn and understand importance of maintaining oxygen saturations>88%;To learn and  understand importance of monitoring SPO2 with pulse oximeter and demonstrate accurate use of the pulse oximeter.;To learn and demonstrate proper pursed lip breathing techniques or other breathing techniques. ;To learn and demonstrate proper use of respiratory medications    Long  Term Goals Maintenance of O2 saturations>88%;Verbalizes importance of monitoring SPO2 with pulse oximeter and return demonstration;Exhibits proper breathing techniques, such as pursed lip breathing or other method taught during program session    Goals/Expected Outcomes Compliance and understanding of oxygen saturation monitoring and breathing techniques to decrease shortness of breath.             Initial Exercise Prescription:  Initial Exercise Prescription - 06/06/23 1200       Date of Initial Exercise RX and Referring Provider   Date 06/06/23    Referring Provider Icard    Expected Discharge Date 09/01/23      Treadmill   MPH 2    Grade 0    Minutes 15      Recumbant Elliptical   Level 1    Minutes 15    METs 2      Prescription Details   Frequency (times per week) 2    Duration Progress to 30 minutes of continuous aerobic without signs/symptoms of physical distress      Intensity   THRR 40-80% of Max Heartrate 58-117    Ratings of Perceived Exertion 11-13    Perceived Dyspnea 0-4      Progression   Progression Continue to progress workloads to maintain intensity without signs/symptoms of physical distress.      Resistance Training   Training Prescription Yes    Weight blue bands    Reps 10-15             Perform Capillary Blood Glucose checks as needed.  Exercise Prescription Changes:   Exercise Prescription Changes     Row Name 06/14/23 1200 06/28/23 1200           Response to Exercise   Blood Pressure (Admit) 114/60 100/58      Blood Pressure (Exercise) 124/66 132/68      Blood Pressure (Exit) 122/70 96/58      Heart Rate (Admit) 71 bpm 76 bpm      Heart Rate  (Exercise) 93 bpm 92 bpm      Heart Rate (Exit) 79 bpm 86 bpm      Oxygen Saturation (Admit) 98 %  97 %      Oxygen Saturation (Exercise) 96 % 95 %      Oxygen Saturation (Exit) 96 % 95 %      Rating of Perceived Exertion (Exercise) 11 9      Perceived Dyspnea (Exercise) 1 0      Duration Progress to 30 minutes of  aerobic without signs/symptoms of physical distress Continue with 30 min of aerobic exercise without signs/symptoms of physical distress.      Intensity THRR unchanged THRR unchanged        Progression   Progression Continue to progress workloads to maintain intensity without signs/symptoms of physical distress. Continue to progress workloads to maintain intensity without signs/symptoms of physical distress.        Resistance Training   Training Prescription Yes Yes      Weight blue bands blue bands      Reps 10-15 10-15      Time 10 Minutes 10 Minutes        Treadmill   MPH 2 2.8      Grade 0 3      Minutes 15 15      METs 2.53 4.3        Recumbant Elliptical   Level 1 2      Minutes 15 15      METs 2.3 3.4               Exercise Comments:   Exercise Comments     Row Name 06/14/23 1613           Exercise Comments Pt completed first day of exercise. Candace Frey exercised for 15 min on the treadmill and recumbent elliptical. She averaged 2.53 METs at 2.0 mph and 0 incline on the treadmill and 1.7 METs at level 1 on the recumbent elliptical. Candace Frey performed the warmup and cooldown standing without limitations. Discussed METs and how to increase METs.                Exercise Goals and Review:   Exercise Goals     Row Name 06/06/23 1055 06/17/23 1100           Exercise Goals   Increase Physical Activity Yes Yes      Intervention Provide advice, education, support and counseling about physical activity/exercise needs.;Develop an individualized exercise prescription for aerobic and resistive training based on initial evaluation findings, risk stratification,  comorbidities and participant's personal goals. Provide advice, education, support and counseling about physical activity/exercise needs.;Develop an individualized exercise prescription for aerobic and resistive training based on initial evaluation findings, risk stratification, comorbidities and participant's personal goals.      Expected Outcomes Long Term: Exercising regularly at least 3-5 days a week.;Short Term: Attend rehab on a regular basis to increase amount of physical activity.;Long Term: Add in home exercise to make exercise part of routine and to increase amount of physical activity. Long Term: Exercising regularly at least 3-5 days a week.;Short Term: Attend rehab on a regular basis to increase amount of physical activity.;Long Term: Add in home exercise to make exercise part of routine and to increase amount of physical activity.      Increase Strength and Stamina Yes Yes      Intervention Provide advice, education, support and counseling about physical activity/exercise needs.;Develop an individualized exercise prescription for aerobic and resistive training based on initial evaluation findings, risk stratification, comorbidities and participant's personal goals. Provide advice, education, support and counseling about physical activity/exercise needs.;Develop an individualized exercise  prescription for aerobic and resistive training based on initial evaluation findings, risk stratification, comorbidities and participant's personal goals.      Expected Outcomes Short Term: Increase workloads from initial exercise prescription for resistance, speed, and METs.;Short Term: Perform resistance training exercises routinely during rehab and add in resistance training at home;Long Term: Improve cardiorespiratory fitness, muscular endurance and strength as measured by increased METs and functional capacity ( ) Short Term: Increase workloads from initial exercise prescription for resistance, speed, and  METs.;Short Term: Perform resistance training exercises routinely during rehab and add in resistance training at home;Long Term: Improve cardiorespiratory fitness, muscular endurance and strength as measured by increased METs and functional capacity ( )      Able to understand and use rate of perceived exertion (RPE) scale Yes Yes      Intervention Provide education and explanation on how to use RPE scale Provide education and explanation on how to use RPE scale      Expected Outcomes Short Term: Able to use RPE daily in rehab to express subjective intensity level;Long Term:  Able to use RPE to guide intensity level when exercising independently Short Term: Able to use RPE daily in rehab to express subjective intensity level;Long Term:  Able to use RPE to guide intensity level when exercising independently      Able to understand and use Dyspnea scale Yes Yes      Intervention Provide education and explanation on how to use Dyspnea scale Provide education and explanation on how to use Dyspnea scale      Expected Outcomes Short Term: Able to use Dyspnea scale daily in rehab to express subjective sense of shortness of breath during exertion;Long Term: Able to use Dyspnea scale to guide intensity level when exercising independently Short Term: Able to use Dyspnea scale daily in rehab to express subjective sense of shortness of breath during exertion;Long Term: Able to use Dyspnea scale to guide intensity level when exercising independently      Knowledge and understanding of Target Heart Rate Range (THRR) Yes Yes      Intervention Provide education and explanation of THRR including how the numbers were predicted and where they are located for reference Provide education and explanation of THRR including how the numbers were predicted and where they are located for reference      Expected Outcomes Short Term: Able to state/look up THRR;Short Term: Able to use daily as guideline for intensity in rehab;Long  Term: Able to use THRR to govern intensity when exercising independently Short Term: Able to state/look up THRR;Short Term: Able to use daily as guideline for intensity in rehab;Long Term: Able to use THRR to govern intensity when exercising independently      Understanding of Exercise Prescription Yes Yes      Intervention Provide education, explanation, and written materials on patient's individual exercise prescription Provide education, explanation, and written materials on patient's individual exercise prescription      Expected Outcomes Short Term: Able to explain program exercise prescription;Long Term: Able to explain home exercise prescription to exercise independently Short Term: Able to explain program exercise prescription;Long Term: Able to explain home exercise prescription to exercise independently               Exercise Goals Re-Evaluation :  Exercise Goals Re-Evaluation     Row Name 06/17/23 1100             Exercise Goal Re-Evaluation   Exercise Goals Review Increase Physical Activity;Able to understand and use Dyspnea  scale;Understanding of Exercise Prescription;Increase Strength and Stamina;Knowledge and understanding of Target Heart Rate Range (THRR);Able to understand and use rate of perceived exertion (RPE) scale       Comments Candace Frey has completed 2 exercise sessions. She exercises for 15 min on the treadmill and recumbent elliptical. Candace Frey averages 2.53 METs at 2.0 mph and 0 incline on the treadmill and 1.7 METs at level 1 on the recumbent elliptical. She performs the warmup and cooldown standing without limitations. It is too soon to note any discernable progressions. Will continue to monitor and progress as able.       Expected Outcomes Through exercise at rehab and home, the patient will decrease shortness of breath with daily activities and feel confident in carrying out an exercise regimen at home.                Discharge Exercise Prescription (Final Exercise  Prescription Changes):  Exercise Prescription Changes - 06/28/23 1200       Response to Exercise   Blood Pressure (Admit) 100/58    Blood Pressure (Exercise) 132/68    Blood Pressure (Exit) 96/58    Heart Rate (Admit) 76 bpm    Heart Rate (Exercise) 92 bpm    Heart Rate (Exit) 86 bpm    Oxygen Saturation (Admit) 97 %    Oxygen Saturation (Exercise) 95 %    Oxygen Saturation (Exit) 95 %    Rating of Perceived Exertion (Exercise) 9    Perceived Dyspnea (Exercise) 0    Duration Continue with 30 min of aerobic exercise without signs/symptoms of physical distress.    Intensity THRR unchanged      Progression   Progression Continue to progress workloads to maintain intensity without signs/symptoms of physical distress.      Resistance Training   Training Prescription Yes    Weight blue bands    Reps 10-15    Time 10 Minutes      Treadmill   MPH 2.8    Grade 3    Minutes 15    METs 4.3      Recumbant Elliptical   Level 2    Minutes 15    METs 3.4             Nutrition:  Target Goals: Understanding of nutrition guidelines, daily intake of sodium 1500mg , cholesterol 200mg , calories 30% from fat and 7% or less from saturated fats, daily to have 5 or more servings of fruits and vegetables.  Biometrics:  Pre Biometrics - 06/06/23 1041       Pre Biometrics   Grip Strength 22 kg              Nutrition Therapy Plan and Nutrition Goals:  Nutrition Therapy & Goals - 06/14/23 1149       Nutrition Therapy   Diet General Healthy Diet      Personal Nutrition Goals   Nutrition Goal Patient to improve diet quality by using the plate method as a guide for meal planning to include lean protein/plant protein, fruits, vegetables, whole grains, nonfat dairy as part of a well-balanced diet.    Comments Candace Frey reports no nutrition concerns at this time. Candace Frey will continue to benefit from participation in pulmonary rehab for nutrition and exercise support.      Intervention  Plan   Intervention Prescribe, educate and counsel regarding individualized specific dietary modifications aiming towards targeted core components such as weight, hypertension, lipid management, diabetes, heart failure and other comorbidities.;Nutrition handout(s) given to patient.  Expected Outcomes Short Term Goal: Understand basic principles of dietary content, such as calories, fat, sodium, cholesterol and nutrients.;Long Term Goal: Adherence to prescribed nutrition plan.             Nutrition Assessments:  MEDIFICTS Score Key: ?70 Need to make dietary changes  40-70 Heart Healthy Diet ? 40 Therapeutic Level Cholesterol Diet   Picture Your Plate Scores: <02 Unhealthy dietary pattern with much room for improvement. 41-50 Dietary pattern unlikely to meet recommendations for good health and room for improvement. 51-60 More healthful dietary pattern, with some room for improvement.  >60 Healthy dietary pattern, although there may be some specific behaviors that could be improved.    Nutrition Goals Re-Evaluation:  Nutrition Goals Re-Evaluation     Row Name 06/14/23 1149             Goals   Current Weight 172 lb 13.5 oz (78.4 kg)       Comment A1c WNL, lipids WNL       Expected Outcome Candace Frey reports no nutrition concerns at this time. Candace Frey will continue to benefit from participation in pulmonary rehab for nutrition and exercise support.                Nutrition Goals Discharge (Final Nutrition Goals Re-Evaluation):  Nutrition Goals Re-Evaluation - 06/14/23 1149       Goals   Current Weight 172 lb 13.5 oz (78.4 kg)    Comment A1c WNL, lipids WNL    Expected Outcome Candace Frey reports no nutrition concerns at this time. Candace Frey will continue to benefit from participation in pulmonary rehab for nutrition and exercise support.             Psychosocial: Target Goals: Acknowledge presence or absence of significant depression and/or stress, maximize coping skills, provide  positive support system. Participant is able to verbalize types and ability to use techniques and skills needed for reducing stress and depression.  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 Yes    Comments Has good support from daughter      Barriers   Psychosocial barriers to participate in program The patient should benefit from training in stress management and relaxation.      Screening Interventions   Interventions Encouraged to exercise    Expected Outcomes Long Term Goal: Stressors or current issues are controlled or eliminated.;Long Term goal: The participant improves quality of Life and PHQ9 Scores as seen by post scores and/or verbalization of changes             Quality of Life Scores:  Scores of 19 and below usually indicate a poorer quality of life in these areas.  A difference of  2-3 points is a clinically meaningful difference.  A difference of 2-3 points in the total score of the Quality of Life Index has been associated with significant improvement in overall quality of life, self-image, physical symptoms, and general health in studies assessing change in quality of life.  PHQ-9: Review Flowsheet  More data exists      06/06/2023 01/11/2023 12/30/2022 12/25/2021 10/20/2021  Depression screen PHQ 2/9  Decreased Interest 0 0 0 0 0  Down, Depressed,  Hopeless 0 0 0 0 0  PHQ - 2 Score 0 0 0 0 0  Altered sleeping 0 0 - 0 -  Tired, decreased energy 1 0 - 0 -  Change in appetite 1 1 - 0 -  Feeling bad or failure about yourself  0 0 - 0 -  Trouble concentrating 0 0 - 0 -  Moving slowly or fidgety/restless 0 0 - 0 -  Suicidal thoughts 0 0 - 0 -  PHQ-9 Score 2 1 - 0 -  Difficult doing work/chores Not difficult  at all Not difficult at all - Not difficult at all -    Details           Interpretation of Total Score  Total Score Depression Severity:  1-4 = Minimal depression, 5-9 = Mild depression, 10-14 = Moderate depression, 15-19 = Moderately severe depression, 20-27 = Severe depression   Psychosocial Evaluation and Intervention:  Psychosocial Evaluation - 06/06/23 1101       Psychosocial Evaluation & Interventions   Interventions Relaxation education;Encouraged to exercise with the program and follow exercise prescription    Comments Pt says she sometimes gets stress about taking care of her husband with dementia    Expected Outcomes For Candace Frey to decrease her stress and attend PR without any psychosocial barriers or concerns    Continue Psychosocial Services  No Follow up required             Psychosocial Re-Evaluation:  Psychosocial Re-Evaluation     Row Name 06/22/23 1541             Psychosocial Re-Evaluation   Current issues with Current Stress Concerns       Comments Candace Frey is still dealing with the stress that comes with being her husbands caregiver. She has started exercising at a gym on the days she does not have PR. She feels this has helped her stress levels decrease. Staff will educate Candace Frey on healthy ways to cope with stress.       Expected Outcomes For Candace Frey to participate in PR free of any psychosocial barrier or concerns.       Interventions Encouraged to attend Pulmonary Rehabilitation for the exercise;Stress management education       Continue Psychosocial Services  Follow up required by staff                Psychosocial Discharge (Final Psychosocial Re-Evaluation):  Psychosocial Re-Evaluation - 06/22/23 1541       Psychosocial Re-Evaluation   Current issues with Current Stress Concerns    Comments Candace Frey is still dealing with the stress that comes with being her husbands caregiver. She has started exercising at a gym on the days she does not have PR. She feels  this has helped her stress levels decrease. Staff will educate Candace Frey on healthy ways to cope with stress.    Expected Outcomes For Candace Frey to participate in PR free of any psychosocial barrier or concerns.    Interventions Encouraged to attend Pulmonary Rehabilitation for the exercise;Stress management education    Continue Psychosocial Services  Follow up required by staff             Education: Education Goals: Education classes will be provided on a weekly basis, covering required topics. Participant will state understanding/return demonstration of topics presented.  Learning Barriers/Preferences:   Education Topics: Introduction to Pulmonary Rehab Group instruction provided by PowerPoint, verbal discussion, and written material to support subject matter. Instructor reviews what Pulmonary Rehab is,  the purpose of the program, and how patients are referred.     Know Your Numbers Group instruction that is supported by a PowerPoint presentation. Instructor discusses importance of knowing and understanding resting, exercise, and post-exercise oxygen saturation, heart rate, and blood pressure. Oxygen saturation, heart rate, blood pressure, rating of perceived exertion, and dyspnea are reviewed along with a normal range for these values.    Exercise for the Pulmonary Patient Group instruction that is supported by a PowerPoint presentation. Instructor discusses benefits of exercise, core components of exercise, frequency, duration, and intensity of an exercise routine, importance of utilizing pulse oximetry during exercise, safety while exercising, and options of places to exercise outside of rehab.       MET Level  Group instruction provided by PowerPoint, verbal discussion, and written material to support subject matter. Instructor reviews what METs are and how to increase METs.    Pulmonary Medications Verbally interactive group education provided by instructor with focus on inhaled  medications and proper administration.   Anatomy and Physiology of the Respiratory System Group instruction provided by PowerPoint, verbal discussion, and written material to support subject matter. Instructor reviews respiratory cycle and anatomical components of the respiratory system and their functions. Instructor also reviews differences in obstructive and restrictive respiratory diseases with examples of each.    Oxygen Safety Group instruction provided by PowerPoint, verbal discussion, and written material to support subject matter. There is an overview of "What is Oxygen" and "Why do we need it".  Instructor also reviews how to create a safe environment for oxygen use, the importance of using oxygen as prescribed, and the risks of noncompliance. There is a brief discussion on traveling with oxygen and resources the patient may utilize.   Oxygen Use Group instruction provided by PowerPoint, verbal discussion, and written material to discuss how supplemental oxygen is prescribed and different types of oxygen supply systems. Resources for more information are provided.  Flowsheet Row PULMONARY REHAB OTHER RESPIRATORY from 06/16/2023 in [facility], V[facility], & Lung [facility]  Date 06/16/23  Educator RT  Instruction Review Code 1- Verbalizes Understanding       Breathing Techniques Group instruction that is supported by demonstration and informational handouts. Instructor discusses the benefits of pursed lip and diaphragmatic breathing and detailed demonstration on how to perform both.  Flowsheet Row PULMONARY REHAB OTHER RESPIRATORY from 06/23/2023 in [facility] for Heart, Vascular, & Lung Health  Date 06/23/23  Educator RN  Instruction Review Code 1- Verbalizes Understanding        Risk Factor Reduction Group instruction that is supported by a PowerPoint presentation. Instructor discusses the definition of a risk factor,  different risk factors for pulmonary disease, and how the heart and lungs work together.   MD Day A group question and answer session with a medical doctor that allows participants to ask questions that relate to their pulmonary disease state.   Nutrition for the Pulmonary Patient Group instruction provided by PowerPoint slides, verbal discussion, and written materials to support subject matter. The instructor gives an explanation and review of healthy diet recommendations, which includes a discussion on weight management, recommendations for fruit and vegetable consumption, as well as protein, fluid, caffeine, fiber, sodium, sugar, and alcohol. Tips for eating when patients are short of breath are discussed.    Other Education Group or individual verbal, written, or video instructions that support the educational goals of the pulmonary rehab program.    Knowledge Questionnaire  Score:  Knowledge Questionnaire Score - 06/06/23 1120       Knowledge Questionnaire Score   Pre Score 16/18             Core Components/Risk Factors/Patient Goals at Admission:  Personal Goals and Risk Factors at Admission - 06/06/23 1101       Core Components/Risk Factors/Patient Goals on Admission    Weight Management Weight Loss    Improve shortness of breath with ADL's Yes    Intervention Provide education, individualized exercise plan and daily activity instruction to help decrease symptoms of SOB with activities of daily living.    Expected Outcomes Long Term: Be able to perform more ADLs without symptoms or delay the onset of symptoms;Short Term: Improve cardiorespiratory fitness to achieve a reduction of symptoms when performing ADLs             Core Components/Risk Factors/Patient Goals Review:   Goals and Risk Factor Review     Row Name 06/22/23 1545             Core Components/Risk Factors/Patient Goals Review   Personal Goals Review Weight Management/Obesity;Improve shortness  of breath with ADL's;Develop more efficient breathing techniques such as purse lipped breathing and diaphragmatic breathing and practicing self-pacing with activity.       Review Goal progressing for wieght loss. Candace Frey is working with staff dietician to achieve weight loss goals. Goal progressing on improving her shortness of breath with ADLs. Goal progressing on developing more efficient breathing techniques such as purse lipped breathing and diaphragmatic breathing; and practicing self-pacing with activity. Candace Frey has only attended 3 classes. We will continue to monitor her progress.       Expected Outcomes See admission goals                Core Components/Risk Factors/Patient Goals at Discharge (Final Review):   Goals and Risk Factor Review - 06/22/23 1545       Core Components/Risk Factors/Patient Goals Review   Personal Goals Review Weight Management/Obesity;Improve shortness of breath with ADL's;Develop more efficient breathing techniques such as purse lipped breathing and diaphragmatic breathing and practicing self-pacing with activity.    Review Goal progressing for wieght loss. Candace Frey is working with staff dietician to achieve weight loss goals. Goal progressing on improving her shortness of breath with ADLs. Goal progressing on developing more efficient breathing techniques such as purse lipped breathing and diaphragmatic breathing; and practicing self-pacing with activity. Candace Frey has only attended 3 classes. We will continue to monitor her progress.    Expected Outcomes See admission goals             ITP Comments:Pt is making expected progress toward Pulmonary Rehab goals after completing 5 sessions. Recommend continued exercise, life style modification, education, and utilization of breathing techniques to increase stamina and strength, while also decreasing shortness of breath with exertion.  Dr. Mechele Collin is Medical Director for Pulmonary Rehab at [facility].      Comments: Dr. Mechele Collin is Medical Director for Pulmonary Rehab at [facility].

## 2023-06-30 ENCOUNTER — Encounter (HOSPITAL_COMMUNITY): Payer: Medicare Other

## 2023-07-05 ENCOUNTER — Encounter (HOSPITAL_COMMUNITY)
Admission: RE | Admit: 2023-07-05 | Discharge: 2023-07-05 | Disposition: A | Discharge status: Home / Self Care | Payer: Medicare Other | Source: Ambulatory Visit | Attending: Pulmonary Disease | Admitting: Pulmonary Disease

## 2023-07-05 DIAGNOSIS — J449 Chronic obstructive pulmonary disease, unspecified: Secondary | ICD-10-CM | POA: Diagnosis not present

## 2023-07-05 NOTE — Progress Notes (Signed)
Daily Session Note  Patient Details  Name: Candace Frey MRN: [id number] Date of Birth: May 10, 1951 Referring Provider:   [name] Pulmonary Rehab Walk Test from 06/06/2023 in [facility] for Heart, Vascular, & Lung Health  Referring Provider Icard       Encounter Date: 07/05/2023  Check In:  Session Check In - 07/05/23 1202       Check-In   Supervising physician immediately available to respond to emergencies [facility] MD immediately available    Physician(s) Jari Favre PA    Location [facility]    Staff Present Raford Pitcher, MS, ACSM-CEP, Exercise Physiologist; Dionisio Paschal, ACSM-CEP, Exercise Physiologist;Samantha Belarus, RD, Dutch Gray, RN, Fuller Plan, RT    Virtual Visit No    Medication changes reported     No    Fall or balance concerns reported    No    Tobacco Cessation No Change    Warm-up and Cool-down Performed as group-led instruction    Resistance Training Performed Yes    VAD Patient? No    PAD/SET Patient? No      Pain Assessment   Currently in Pain? No/denies             Capillary Blood Glucose: No results found for this or any previous visit (from the past 24 hour(s)).    Social History   Tobacco Use  Smoking Status Former   Current packs/day: 0.00   Average packs/day: 2.0 packs/day for 43.0 years (86.0 ttl pk-yrs)   Types: Cigarettes   Start date: 11/23/1963   Quit date: 11/22/2006   Years since quitting: 16.6  Smokeless Tobacco Never    Goals Met:  Independence with exercise equipment Exercise tolerated well No report of concerns or symptoms today Strength training completed today  Goals Unmet:  Not Applicable  Comments: Service time is from 1013 to 1140.    Dr. Mechele Collin is Medical Director for Pulmonary Rehab at [facility].

## 2023-07-07 ENCOUNTER — Encounter (HOSPITAL_COMMUNITY)
Admission: RE | Admit: 2023-07-07 | Discharge: 2023-07-07 | Disposition: A | Discharge status: Home / Self Care | Payer: Medicare Other | Source: Ambulatory Visit | Attending: Pulmonary Disease | Admitting: Pulmonary Disease

## 2023-07-07 VITALS — Wt 170.0 lb

## 2023-07-07 DIAGNOSIS — J449 Chronic obstructive pulmonary disease, unspecified: Secondary | ICD-10-CM | POA: Diagnosis not present

## 2023-07-07 NOTE — Progress Notes (Signed)
Daily Session Note  Patient Details  Name: Candace Frey MRN: [id number] Date of Birth: 23-Jun-1951 Referring Provider:   [name] Pulmonary Rehab Walk Test from 06/06/2023 in [facility] for Heart, Vascular, & Lung Health  Referring Provider Icard       Encounter Date: 07/07/2023  Check In:  Session Check In - 07/07/23 1213       Check-In   Supervising physician immediately available to respond to emergencies [facility] MD immediately available    Physician(s) Edd Fabian, NP    Location [facility]    Staff Present Raford Pitcher, MS, ACSM-CEP, Exercise Physiologist;Randi Dionisio Paschal, ACSM-CEP, Exercise Physiologist;Samantha Belarus, RD, Dutch Gray, RN, Fuller Plan, RT    Virtual Visit No    Medication changes reported     No    Fall or balance concerns reported    No    Tobacco Cessation No Change    Warm-up and Cool-down Performed as group-led instruction    Resistance Training Performed Yes    VAD Patient? No    PAD/SET Patient? No      Pain Assessment   Currently in Pain? No/denies    Multiple Pain Sites No             Capillary Blood Glucose: No results found for this or any previous visit (from the past 24 hour(s)).    Social History   Tobacco Use  Smoking Status Former   Current packs/day: 0.00   Average packs/day: 2.0 packs/day for 43.0 years (86.0 ttl pk-yrs)   Types: Cigarettes   Start date: 11/23/1963   Quit date: 11/22/2006   Years since quitting: 16.6  Smokeless Tobacco Never    Goals Met:  Proper associated with RPD/PD & O2 Sat Independence with exercise equipment Exercise tolerated well No report of concerns or symptoms today Strength training completed today  Goals Unmet:  Not Applicable  Comments: Service time is from 1014 to 1145.    Dr. Mechele Collin is Medical Director for Pulmonary Rehab at [facility].

## 2023-07-12 ENCOUNTER — Encounter (HOSPITAL_COMMUNITY): Payer: Medicare Other

## 2023-07-12 DIAGNOSIS — M546 Pain in thoracic spine: Secondary | ICD-10-CM | POA: Diagnosis not present

## 2023-07-12 DIAGNOSIS — M542 Cervicalgia: Secondary | ICD-10-CM | POA: Diagnosis not present

## 2023-07-14 ENCOUNTER — Encounter (HOSPITAL_COMMUNITY)
Admission: RE | Admit: 2023-07-14 | Discharge: 2023-07-14 | Disposition: A | Discharge status: Home / Self Care | Payer: Medicare Other | Source: Ambulatory Visit | Attending: Pulmonary Disease | Admitting: Pulmonary Disease

## 2023-07-14 DIAGNOSIS — J449 Chronic obstructive pulmonary disease, unspecified: Secondary | ICD-10-CM

## 2023-07-14 NOTE — Progress Notes (Signed)
Daily Session Note  Patient Details  Name: Candace Frey MRN: [id number] Date of Birth: 1951/03/23 Referring Provider:   [name] Pulmonary Rehab Walk Test from 06/06/2023 in [facility] for Heart, Vascular, & Lung Health  Referring Provider Icard       Encounter Date: 07/14/2023  Check In:  Session Check In - 07/14/23 1051       Check-In   Supervising physician immediately available to respond to emergencies [facility] MD immediately available    Physician(s) Neila Gear, NP    [facility]    Staff Present Raford Pitcher, MS, ACSM-CEP, Exercise Physiologist;Antwyne Pingree Dionisio Paschal, ACSM-CEP, Exercise Physiologist;Samantha Belarus, RD, Dutch Gray, RN, Fuller Plan, RT    Virtual Visit No    Medication changes reported     No    Fall or balance concerns reported    No    Tobacco Cessation No Change    Warm-up and Cool-down Performed as group-led instruction    Resistance Training Performed Yes    VAD Patient? No    PAD/SET Patient? No      Pain Assessment   Currently in Pain? No/denies    Multiple Pain Sites No             Capillary Blood Glucose: No results found for this or any previous visit (from the past 24 hour(s)).    Social History   Tobacco Use  Smoking Status Former   Current packs/day: 0.00   Average packs/day: 2.0 packs/day for 43.0 years (86.0 ttl pk-yrs)   Types: Cigarettes   Start date: 11/23/1963   Quit date: 11/22/2006   Years since quitting: 16.6  Smokeless Tobacco Never    Goals Met:  Proper associated with RPD/PD & O2 Sat Independence with exercise equipment Exercise tolerated well No report of concerns or symptoms today Strength training completed today  Goals Unmet:  Not Applicable  Comments: Service time is from 1010 to 1148.    Dr. Mechele Collin is Medical Director for Pulmonary Rehab at [facility].

## 2023-07-18 ENCOUNTER — Encounter: Payer: Self-pay | Admitting: Family Medicine

## 2023-07-18 ENCOUNTER — Ambulatory Visit: Payer: Medicare Other | Admitting: Family Medicine

## 2023-07-18 VITALS — BP 118/78 | HR 73 | Temp 97.6°F | Ht 69.0 in | Wt 169.0 lb

## 2023-07-18 DIAGNOSIS — E039 Hypothyroidism, unspecified: Secondary | ICD-10-CM | POA: Diagnosis not present

## 2023-07-18 DIAGNOSIS — J449 Chronic obstructive pulmonary disease, unspecified: Secondary | ICD-10-CM | POA: Diagnosis not present

## 2023-07-18 DIAGNOSIS — I700 Atherosclerosis of aorta: Secondary | ICD-10-CM | POA: Diagnosis not present

## 2023-07-18 DIAGNOSIS — E785 Hyperlipidemia, unspecified: Secondary | ICD-10-CM | POA: Diagnosis not present

## 2023-07-18 LAB — COMPREHENSIVE METABOLIC PANEL WITH GFR
ALT: 15 U/L (ref 0–35)
AST: 17 U/L (ref 0–37)
Albumin: 3.9 g/dL (ref 3.5–5.2)
Alkaline Phosphatase: 67 U/L (ref 39–117)
BUN: 8 mg/dL (ref 6–23)
CO2: 27 meq/L (ref 19–32)
Calcium: 8.9 mg/dL (ref 8.4–10.5)
Chloride: 103 meq/L (ref 96–112)
Creatinine, Ser: 0.67 mg/dL (ref 0.40–1.20)
GFR: 86.28 mL/min
Glucose, Bld: 96 mg/dL (ref 70–99)
Potassium: 4.2 meq/L (ref 3.5–5.1)
Sodium: 138 meq/L (ref 135–145)
Total Bilirubin: 0.7 mg/dL (ref 0.2–1.2)
Total Protein: 6.4 g/dL (ref 6.0–8.3)

## 2023-07-18 LAB — TSH: TSH: 0.03 u[IU]/mL — ABNORMAL LOW (ref 0.35–5.50)

## 2023-07-18 NOTE — Patient Instructions (Addendum)
Let us know when you get your flu/COVID vaccine at the pharmacy.  Please stop by lab before you go If you have mychart- we will send your results within 3 business days of [place] receiving them.  If you do not have mychart- we will call you about results within 5 business days of [place] receiving them.  *please also note that you will see labs on mychart as soon as they post. I will later go in and write notes on them- will say "notes from Dr. Durene Cal"   Recommended follow up: Return in about 6 months (around 01/18/2024) for physical or sooner if needed.Schedule b4 you leave.

## 2023-07-18 NOTE — Progress Notes (Signed)
Phone [phone] In person visit   Subjective:   Candace Frey is a 72 y.o. year old very pleasant female patient who presents for/with See problem oriented charting Chief Complaint  Patient presents with   Medical Management of Chronic Issues    Mammogram requested.   ankle edema    Pt c/o right ankle edema    Past Medical History-  Patient Active Problem List   Diagnosis Date Noted   Hyperlipidemia 06/28/2022    Priority: Medium    Aortic atherosclerosis (HCC) 06/18/2021    Priority: Medium    Acquired hypothyroidism 07/17/2020    Priority: Medium    COPD GOLD I  07/05/2017    Priority: Medium    History of DVT (deep vein thrombosis) 07/22/2016    Priority: Low   Complication of anesthesia     Medications- reviewed and updated Current Outpatient Medications  Medication Sig Dispense Refill   albuterol (VENTOLIN HFA) 108 (90 Base) MCG/ACT inhaler Inhale 1-2 puffs into the lungs every 6 (six) hours as needed. 8 g 2   Budeson-Glycopyrrol-Formoterol (BREZTRI AEROSPHERE) 160-9-4.8 MCG/ACT AERO Inhale 2 puffs into the lungs in the morning and at bedtime. 1 each 5   Calcium Carbonate-Vitamin D (CALCIUM + D PO) Take 1 tablet by mouth daily.      Estradiol 0.75 MG/1.25 GM (0.06%) topical gel Place 1.25 g onto the skin daily.     levothyroxine (SYNTHROID) 100 MCG tablet Take 1 tablet (100 mcg total) by mouth daily. 90 tablet 3   Multiple Vitamins-Minerals (PRESERVISION AREDS 2 PO) Take 1 tablet by mouth 2 (two) times daily.     No current facility-administered medications for this visit.     Objective:  BP 118/78   Pulse 73   Temp 97.6 F (36.4 C)   Ht 5\' 9"  (1.753 m)   Wt 169 lb (76.7 kg)   SpO2 96%   BMI 24.96 kg/m  Gen: NAD, resting comfortably CV: RRR no murmurs rubs or gallops Lungs: CTAB no crackles, wheeze, rhonchi Ext: trace edema Right greater than left Skin: warm, dry    Assessment and Plan   # Health maintenance-team requested copy of  mammogram   # Right ankle edema #COPD S: Patient noted this starting prior to June- ongoing issue since prior DVT.   -At June visit we had reviewed her recent D-dimer which was negative.  She also had BNP which pointed away from heart failure.  At that time had some shortness of breath and had a recent change of pulmonary to [facility] with underlying COPD-she did not tolerate this or Stiolto..  She also had swelling in bilateral ankles right worse than left at that time-present for several years but worse for few weeks at time of visit.  Exam was similar to February 2024 visit.  We had opted out of echocardiogram.  She has had multiple visits with pulmonary rehab- she reports this has been extremely helpful- really enjoys program and has learned- breathing is much better even without inhaer  -even with compression stockings only gets some relief- still swells at ankle and this is even with thigh highs and regularly changing out/washing. Also trying to uncross legs A/P: ongoing ankle edema right> left with history DVT- last year 02/04/22 scan showed no DVT. Offered vascular referral- shed like to hold off for now. May try pedals for foot motion and try to keep legs uncrossed- she is usually right over left which may worsen situation. Continue compression  COPD much improved  with pulmonary rehab- she will do full 3 months.   #hyperlipidemia # Aortic atherosclerosis but no coronary artery calcium S: Medication: None Lab Results  Component Value Date   CHOL 182 01/11/2023   HDL 87.10 01/11/2023   LDLCALC 78 01/11/2023   TRIG 83.0 01/11/2023   CHOLHDL 2 01/11/2023   A/P: Mildly elevated-patient wants to work on diet and exercise to try to bring this down- recheck at physical  #hypothyroidism S: compliant On thyroid medication-levothyroxine 100 mcg Lab Results  Component Value Date   TSH 0.36 01/11/2023   A/P:stable- continue current medicines     Recommended follow up: Return in about 6 months  (around 01/18/2024) for physical or sooner if needed.Schedule b4 you leave. Future Appointments  Date Time Provider Department Center  07/19/2023 10:15 AM [facility] None  07/21/2023 10:15 AM [facility] None  07/26/2023 10:15 AM [facility] None  07/28/2023 10:15 AM [facility] None  08/02/2023 10:15 AM [facility] None  08/04/2023 10:15 AM [facility] None  08/09/2023 10:15 AM [facility] None  08/11/2023 10:15 AM [facility] None  08/16/2023 10:15 AM [facility] None  08/18/2023 10:15 AM [facility] None  08/23/2023 10:15 AM [facility] None  08/25/2023 10:15 AM [facility] None  08/30/2023 10:15 AM [facility] None  09/01/2023 10:15 AM [facility] None  01/05/2024  3:30 PM [facility] ANNUAL WELLNESS VISIT 1 [facility] PEC    Lab/Order associations:   ICD-10-CM   1. Acquired hypothyroidism  E03.9 TSH    2. Hyperlipidemia, unspecified hyperlipidemia type  E78.5 Comprehensive metabolic panel    3. Aortic atherosclerosis (HCC)  I70.0     4. COPD GOLD I   J44.9       No orders of the defined types were placed in this encounter.   Return precautions advised.  Tana Conch, MD

## 2023-07-19 ENCOUNTER — Encounter (HOSPITAL_COMMUNITY): Payer: Medicare Other

## 2023-07-19 ENCOUNTER — Other Ambulatory Visit: Payer: Self-pay

## 2023-07-19 DIAGNOSIS — E039 Hypothyroidism, unspecified: Secondary | ICD-10-CM

## 2023-07-19 MED ORDER — LEVOTHYROXINE SODIUM 88 MCG PO TABS: 88.0000 ug | ORAL_TABLET | Freq: Every day | ORAL | 3 refills | Status: DC

## 2023-07-21 ENCOUNTER — Encounter (HOSPITAL_COMMUNITY)
Admission: RE | Admit: 2023-07-21 | Discharge: 2023-07-21 | Disposition: A | Discharge status: Home / Self Care | Payer: Medicare Other | Source: Ambulatory Visit | Attending: Pulmonary Disease | Admitting: Pulmonary Disease

## 2023-07-21 DIAGNOSIS — J449 Chronic obstructive pulmonary disease, unspecified: Secondary | ICD-10-CM | POA: Diagnosis not present

## 2023-07-21 NOTE — Progress Notes (Signed)
Daily Session Note  Patient Details  Name: Candace Frey MRN: [id number] Date of Birth: [birth year] Referring Provider:   [name] Pulmonary Rehab Walk Test from 06/06/2023 in [facility] for Heart, Vascular, & Lung Health  Referring Provider Icard       Encounter Date: 07/21/2023  Check In:  Session Check In - 07/21/23 1204       Check-In   Supervising physician immediately available to respond to emergencies [facility] MD immediately available    Physician(s) Bernadene Person, NP    Location [facility]    Staff Present Elissa Lovett BS, ACSM-CEP, Exercise Physiologist;Kaylee Earlene Plater, MS, ACSM-CEP, Exercise Physiologist;Tawnee Clegg Thedore Mins, RN, BSN    Virtual Visit No    Medication changes reported     No    Fall or balance concerns reported    No    Tobacco Cessation No Change    Warm-up and Cool-down Performed as group-led instruction    Resistance Training Performed Yes    VAD Patient? No    PAD/SET Patient? No      Pain Assessment   Currently in Pain? No/denies    Multiple Pain Sites No             Capillary Blood Glucose: No results found for this or any previous visit (from the past 24 hour(s)).    Social History   Tobacco Use  Smoking Status Former   Current packs/day: 0.00   Average packs/day: 2.0 packs/day for 43.0 years (86.0 ttl pk-yrs)   Types: Cigarettes   Start date: 11/23/1963   Quit date: 11/22/2006   Years since quitting: 16.6  Smokeless Tobacco Never    Goals Met:  Proper associated with RPD/PD & O2 Sat Independence with exercise equipment Exercise tolerated well No report of concerns or symptoms today Strength training completed today  Goals Unmet:  Not Applicable  Comments: Service time is from 1011 to 1140.    Dr. Mechele Collin is Medical Director for Pulmonary Rehab at [facility].

## 2023-07-22 ENCOUNTER — Other Ambulatory Visit: Payer: Self-pay

## 2023-07-26 ENCOUNTER — Encounter (HOSPITAL_COMMUNITY)
Admission: RE | Admit: 2023-07-26 | Discharge: 2023-07-26 | Disposition: A | Discharge status: Home / Self Care | Payer: Medicare Other | Source: Ambulatory Visit | Attending: Pulmonary Disease | Admitting: Pulmonary Disease

## 2023-07-26 VITALS — Wt 170.0 lb

## 2023-07-26 DIAGNOSIS — Z87891 Personal history of nicotine dependence: Secondary | ICD-10-CM | POA: Diagnosis not present

## 2023-07-26 DIAGNOSIS — J449 Chronic obstructive pulmonary disease, unspecified: Secondary | ICD-10-CM | POA: Diagnosis not present

## 2023-07-26 DIAGNOSIS — Z79899 Other long term (current) drug therapy: Secondary | ICD-10-CM | POA: Diagnosis not present

## 2023-07-26 NOTE — Progress Notes (Signed)
Daily Session Note  Patient Details  Name: Candace Frey MRN: [id number] Date of Birth: 09-29-1951 Referring Provider:   [name] Pulmonary Rehab Walk Test from 06/06/2023 in [facility]  Referring Provider Icard       Encounter Date: 07/26/2023  Check In:  Session Check In - 07/26/23 1130       Check-In   Supervising physician immediately available to respond to emergencies [facility] MD immediately available    Physician(s) Bernadene Person, NP    Location [facility]    Staff Present Elissa Lovett BS, ACSM-CEP, Exercise Physiologist;Kaylee Earlene Plater, MS, ACSM-CEP, Exercise Physiologist;Kenyetta Wimbish Hermine Messick Belarus, RD, Dutch Gray, RN, BSN    Virtual Visit No    Medication changes reported     No    Fall or balance concerns reported    No    Tobacco Cessation No Change    Warm-up and Cool-down Performed as group-led instruction    Resistance Training Performed Yes    VAD Patient? No    PAD/SET Patient? No      Pain Assessment   Currently in Pain? No/denies    Multiple Pain Sites No             Capillary Blood Glucose: No results found for this or any previous visit (from the past 24 hour(s)).   Exercise Prescription Changes - 07/26/23 1100       Response to Exercise   Blood Pressure (Admit) 114/62    Blood Pressure (Exercise) 138/62    Blood Pressure (Exit) 68/60    Heart Rate (Admit) 71 bpm    Heart Rate (Exercise) 127 bpm    Heart Rate (Exit) 80 bpm    Oxygen Saturation (Admit) 97 %    Oxygen Saturation (Exercise) 94 %    Oxygen Saturation (Exit) 97 %    Rating of Perceived Exertion (Exercise) 11    Perceived Dyspnea (Exercise) 1    Duration Continue with 30 min of aerobic exercise without signs/symptoms of physical distress.    Intensity THRR unchanged      Progression   Progression Continue to progress workloads to maintain intensity without signs/symptoms of physical  distress.      Resistance Training   Training Prescription Yes    Weight blue bands    Reps 10-15    Time 10 Minutes      Treadmill   MPH 3.5    Grade 3    Minutes 15    METs 5.13      Recumbant Elliptical   Level 5    Minutes 15    METs 3.7             Social History   Tobacco Use  Smoking Status Former   Current packs/day: 0.00   Average packs/day: 2.0 packs/day for 43.0 years (86.0 ttl pk-yrs)   Types: Cigarettes   Start date: 11/23/1963   Quit date: 11/22/2006   Years since quitting: 16.6  Smokeless Tobacco Never    Goals Met:  Proper associated with RPD/PD & O2 Sat Independence with exercise equipment Exercise tolerated well No report of concerns or symptoms today Strength training completed today  Goals Unmet:  Not Applicable  Comments: Service time is from 1022 to 1140.    Dr. Mechele Collin is Medical Director for Pulmonary Rehab at [facility].

## 2023-07-27 NOTE — Progress Notes (Signed)
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 , Disp: , Rfl:    Estradiol 0.75 MG/1.25 GM (0.06%) topical gel, Place 1.25 g onto the skin daily., Disp: , Rfl:    levothyroxine (SYNTHROID) 88 MCG tablet, Take 1 tablet (88 mcg total) by mouth daily., Disp: 90 tablet, Rfl: 3   Multiple Vitamins-Minerals (PRESERVISION AREDS 2 PO), Take 1 tablet by mouth 2 (two) times daily., Disp: , Rfl:   Past Medical History: Past Medical History:  Diagnosis Date   Acute deep vein thrombosis (DVT) of distal vein of right lower extremity (HCC) 07/22/2016   Acute medial meniscal tear 01/29/2015   Chicken pox    Chronic obstructive lung disease (HCC) 05/23/2019   Colon polyps    Complication of anesthesia    slow to wake up    COPD (chronic obstructive pulmonary disease) (HCC)    Genital warts    Multiple pulmonary nodules determined by computed tomography of lung 07/05/2017   CT 03/03/17  Right lower lobe nodularity described on the prior exam is less  apparent today, favored to represent an area of scarring. other tiny nodules described on the prior are not readily apparent. No enlarging or dominant nodule identified > rec continue low dose annual  screening until 2023 per PCP (pt has fm hx lung ca)    Osteopenia    Shortness of breath dyspnea    with exertion was a smoker for many year per patient    Thyroid disease    UTI (urinary tract infection)     Tobacco Use: Social History   Tobacco Use  Smoking Status Former   Current packs/day: 0.00   Average packs/day: 2.0 packs/day for 43.0 years (86.0 ttl pk-yrs)   Types: Cigarettes   Start date: 11/23/1963   Quit date: 11/22/2006   Years since quitting: 16.6  Smokeless Tobacco Never    Labs: Review Flowsheet       Latest Ref Rng & Units 07/14/2020 12/25/2021 01/11/2023 01/17/2023  Labs for ITP Cardiac and Pulmonary Rehab  Cholestrol 0 - 200 mg/dL 951  884  166  -  LDL (calc) 0 - 99 mg/dL 75  83  78  -  HDL-C >06.30 mg/dL 81  16.01  09.32  -  Trlycerides 0.0 - 149.0 mg/dL 75  355.7  32.2  -  Hemoglobin A1c 4.6 - 6.5 % - - - 5.6     Details            Capillary Blood Glucose: No results found for: "GLUCAP"  Pulmonary Assessment Scores:  Pulmonary Assessment Scores     Row Name 06/06/23 1115         ADL UCSD   SOB Score total 28       CAT Score   CAT Score 8       mMRC Score   mMRC Score 2             UCSD: Self-administered rating of dyspnea associated with activities of daily living (ADLs) 6-point scale (0 = "not at all" to 5 = "maximal or unable to do because of breathlessness")  Scoring Scores range from 0 to 120.  Minimally important difference is 5 units  CAT: CAT can identify the health impairment of COPD patients and is better correlated with disease progression.  CAT has a scoring range of zero to 40. The CAT score is classified into four groups of low (less than 10), medium (10 - 20), high (21-30) and very high (31-40) based on the impact level  of disease on health status. A CAT score over 10 suggests significant symptoms.  A worsening CAT score could be explained by an exacerbation, poor medication adherence, poor inhaler technique, or progression of COPD or comorbid conditions.  CAT MCID is 2 points  mMRC: mMRC (Modified Medical Research Council) Dyspnea Scale is used to assess the degree of baseline functional disability in patients of respiratory disease due to dyspnea. No minimal important difference is established. A decrease in score of 1 point or greater is considered a positive change.   Pulmonary Function Assessment:  Pulmonary Function Assessment - 06/06/23 1100       Breath   Bilateral Breath Sounds Decreased    Shortness of Breath Yes;Fear of Shortness of Breath;Limiting activity             Exercise Target Goals: Exercise Program Goal: Individual exercise prescription set using results from initial 6 min walk test and THRR while considering  patient's activity barriers and safety.   Exercise Prescription Goal: Initial exercise prescription builds to 30-45 minutes a day of aerobic activity, 2-3 days per week.  Home exercise guidelines will be given to patient during program as part of exercise prescription that the participant will acknowledge.  Activity Barriers & Risk Stratification:  Activity Barriers & Cardiac Risk Stratification - 06/06/23 1054       Activity Barriers & Cardiac Risk Stratification   Activity Barriers Other (comment);Right Knee Replacement;Muscular Weakness;Deconditioning;Shortness of Breath    Comments Right hip pain             6 Minute Walk:  6 Minute Walk     Row Name 06/06/23 1212         6 Minute Walk   Phase Initial     Distance 1290 feet     Walk Time 6 minutes     # of Rest Breaks 0     MPH 2.44     METS 3.11     RPE 13     Perceived Dyspnea  2.5     VO2 Peak 10.87     Symptoms No     Resting HR 73 bpm     Resting BP 112/64     Resting Oxygen Saturation   98 %     Exercise Oxygen Saturation  during 6 min walk 93 %     Max Ex. HR 121 bpm     Max Ex. BP 142/68     2 Minute Post BP 120/68  Interval HR   1 Minute HR 101     2 Minute HR 111     3 Minute HR 111     4 Minute HR 121     5 Minute HR 118     6 Minute HR 117     2 Minute Post HR 84     Interval Heart Rate? Yes       Interval Oxygen   Interval Oxygen? Yes     Baseline Oxygen Saturation % 98 %     1 Minute Oxygen Saturation % 97 %     1 Minute Liters of Oxygen 0 L     2 Minute Oxygen Saturation % 96 %     2 Minute Liters of Oxygen 0 L     3 Minute Oxygen Saturation % 95 %     3 Minute Liters of Oxygen 0 L     4 Minute Oxygen Saturation % 93 %     4 Minute Liters of Oxygen 0 L     5 Minute Oxygen Saturation % 94 %     5 Minute Liters of Oxygen 0 L     6 Minute Oxygen Saturation % 97 %     6 Minute Liters of Oxygen 0 L     2 Minute Post Oxygen Saturation % 99 %     2 Minute Post Liters of Oxygen 0 L              Oxygen Initial Assessment:  Oxygen Initial Assessment - 06/06/23 1059       Home Oxygen   Home Oxygen Device None    Sleep Oxygen Prescription None    Home Exercise Oxygen Prescription None    Home Resting Oxygen Prescription None      Initial 6 min Walk   Oxygen Used None      Program Oxygen Prescription   Program Oxygen Prescription None      Intervention   Short Term Goals To learn and understand importance of maintaining oxygen saturations>88%;To learn and understand importance of monitoring SPO2 with pulse oximeter and demonstrate accurate use of the pulse oximeter.;To learn and demonstrate proper pursed lip breathing techniques or other breathing techniques. ;To learn and demonstrate proper use of respiratory medications    Long  Term Goals Maintenance of O2 saturations>88%;Verbalizes importance of monitoring SPO2 with pulse oximeter and return demonstration;Exhibits proper breathing techniques, such as pursed lip breathing or other  method taught during program session             Oxygen Re-Evaluation:  Oxygen Re-Evaluation     Row Name 06/17/23 1102 07/21/23 1611           Program Oxygen Prescription   Program Oxygen Prescription None None        Home Oxygen   Home Oxygen Device None None      Sleep Oxygen Prescription None None      Home Exercise Oxygen Prescription None None      Home Resting Oxygen Prescription None None        Goals/Expected Outcomes   Short Term Goals To learn and understand importance of maintaining oxygen saturations>88%;To learn and understand importance of monitoring SPO2 with pulse oximeter and demonstrate accurate use of the pulse oximeter.;To learn and demonstrate proper pursed lip breathing techniques or other breathing techniques. ;To learn and demonstrate proper use of respiratory medications To learn and understand importance of maintaining oxygen saturations>88%;To learn and understand importance of monitoring  SPO2 with pulse oximeter and demonstrate accurate use of the pulse oximeter.;To learn and demonstrate proper pursed lip breathing techniques or other breathing techniques. ;To learn and demonstrate proper use of respiratory medications      Long  Term Goals Maintenance of O2 saturations>88%;Verbalizes importance of monitoring SPO2 with pulse oximeter and return demonstration;Exhibits proper breathing techniques, such as pursed lip breathing or other method taught during program session Maintenance of O2 saturations>88%;Verbalizes importance of monitoring SPO2 with pulse oximeter and return demonstration;Exhibits proper breathing techniques, such as pursed lip breathing or other method taught during program session      Goals/Expected Outcomes Compliance and understanding of oxygen saturation monitoring and breathing techniques to decrease shortness of breath. Compliance and understanding of oxygen saturation monitoring and breathing techniques to decrease shortness of breath.                Oxygen Discharge (Final Oxygen Re-Evaluation):  Oxygen Re-Evaluation - 07/21/23 1611       Program Oxygen Prescription   Program Oxygen Prescription None      Home Oxygen   Home Oxygen Device None    Sleep Oxygen Prescription None    Home Exercise Oxygen Prescription None    Home Resting Oxygen Prescription None      Goals/Expected Outcomes   Short Term Goals To learn and understand importance of maintaining oxygen saturations>88%;To learn and understand importance of monitoring SPO2 with pulse oximeter and demonstrate accurate use of the pulse oximeter.;To learn and demonstrate proper pursed lip breathing techniques or other breathing techniques. ;To learn and demonstrate proper use of respiratory medications    Long  Term Goals Maintenance of O2 saturations>88%;Verbalizes importance of monitoring SPO2 with pulse oximeter and return demonstration;Exhibits proper breathing techniques, such as pursed lip breathing or other method taught during program session    Goals/Expected Outcomes Compliance and understanding of oxygen saturation monitoring and breathing techniques to decrease shortness of breath.             Initial Exercise Prescription:  Initial Exercise Prescription - 06/06/23 1200       Date of Initial Exercise RX and Referring Provider   Date 06/06/23    Referring Provider Icard    Expected Discharge Date 09/01/23      Treadmill   MPH 2    Grade 0    Minutes 15      Recumbant Elliptical   Level 1    Minutes 15    METs 2      Prescription Details   Frequency (times per week) 2    Duration Progress to 30 minutes of continuous aerobic without signs/symptoms of physical distress      Intensity   THRR 40-80% of Max Heartrate 58-117    Ratings of Perceived Exertion 11-13    Perceived Dyspnea 0-4      Progression   Progression Continue to progress workloads to maintain intensity without signs/symptoms of physical distress.      Resistance  Training   Training Prescription Yes    Weight blue bands    Reps 10-15             Perform Capillary Blood Glucose checks as needed.  Exercise Prescription Changes:   Exercise Prescription Changes     Row Name 06/14/23 1200 06/28/23 1200 07/07/23 1213 07/26/23 1100       Response to Exercise   Blood Pressure (Admit) 114/60 100/58 102/58 114/62    Blood Pressure (Exercise) 124/66 132/68 -- 138/62  Blood Pressure (Exit) 122/70 96/58 94/54  68/60    Heart Rate (Admit) 71 bpm 76 bpm 75 bpm 71 bpm    Heart Rate (Exercise) 93 bpm 92 bpm 90 bpm 127 bpm    Heart Rate (Exit) 79 bpm 86 bpm 88 bpm 80 bpm    Oxygen Saturation (Admit) 98 % 97 % 97 % 97 %    Oxygen Saturation (Exercise) 96 % 95 % 97 % 94 %    Oxygen Saturation (Exit) 96 % 95 % 95 % 97 %    Rating of Perceived Exertion (Exercise) 11 9 11 11     Perceived Dyspnea (Exercise) 1 0 1 1    Duration Progress to 30 minutes of  aerobic without signs/symptoms of physical distress Continue with 30 min of aerobic exercise without signs/symptoms of physical distress. Continue with 30 min of aerobic exercise without signs/symptoms of physical distress. Continue with 30 min of aerobic exercise without signs/symptoms of physical distress.    Intensity THRR unchanged THRR unchanged THRR unchanged THRR unchanged      Progression   Progression Continue to progress workloads to maintain intensity without signs/symptoms of physical distress. Continue to progress workloads to maintain intensity without signs/symptoms of physical distress. Continue to progress workloads to maintain intensity without signs/symptoms of physical distress. Continue to progress workloads to maintain intensity without signs/symptoms of physical distress.      Resistance Training   Training Prescription Yes Yes Yes Yes    Weight blue bands blue bands blue bands blue bands    Reps 10-15 10-15 10-15 10-15    Time 10 Minutes 10 Minutes 10 Minutes 10 Minutes       Treadmill   MPH 2 2.8 3 3.5    Grade 0 3 4 3     Minutes 15 15 15 15     METs 2.53 4.3 4.95 5.13      Recumbant Elliptical   Level 1 2 3 5     Minutes 15 15 15 15     METs 2.3 3.4 4.2 3.7             Exercise Comments:   Exercise Comments     Row Name 06/14/23 1613           Exercise Comments Pt completed first day of exercise. Candace Frey exercised for 15 min on the treadmill and recumbent elliptical. She averaged 2.53 METs at 2.0 mph and 0 incline on the treadmill and 1.7 METs at level 1 on the recumbent elliptical. Candace Frey performed the warmup and cooldown standing without limitations. Discussed METs and how to increase METs.                Exercise Goals and Review:   Exercise Goals     Row Name 06/06/23 1055 06/17/23 1100 07/21/23 1609         Exercise Goals   Increase Physical Activity Yes Yes Yes     Intervention Provide advice, education, support and counseling about physical activity/exercise needs.;Develop an individualized exercise prescription for aerobic and resistive training based on initial evaluation findings, risk stratification, comorbidities and participant's personal goals. Provide advice, education, support and counseling about physical activity/exercise needs.;Develop an individualized exercise prescription for aerobic and resistive training based on initial evaluation findings, risk stratification, comorbidities and participant's personal goals. Provide advice, education, support and counseling about physical activity/exercise needs.;Develop an individualized exercise prescription for aerobic and resistive training based on initial evaluation findings, risk stratification, comorbidities and participant's personal goals.     Expected Outcomes  Long Term: Exercising regularly at least 3-5 days a week.;Short Term: Attend rehab on a regular basis to increase amount of physical activity.;Long Term: Add in home exercise to make exercise part of routine and to increase  amount of physical activity. Long Term: Exercising regularly at least 3-5 days a week.;Short Term: Attend rehab on a regular basis to increase amount of physical activity.;Long Term: Add in home exercise to make exercise part of routine and to increase amount of physical activity. Long Term: Exercising regularly at least 3-5 days a week.;Short Term: Attend rehab on a regular basis to increase amount of physical activity.;Long Term: Add in home exercise to make exercise part of routine and to increase amount of physical activity.     Increase Strength and Stamina Yes Yes Yes     Intervention Provide advice, education, support and counseling about physical activity/exercise needs.;Develop an individualized exercise prescription for aerobic and resistive training based on initial evaluation findings, risk stratification, comorbidities and participant's personal goals. Provide advice, education, support and counseling about physical activity/exercise needs.;Develop an individualized exercise prescription for aerobic and resistive training based on initial evaluation findings, risk stratification, comorbidities and participant's personal goals. Provide advice, education, support and counseling about physical activity/exercise needs.;Develop an individualized exercise prescription for aerobic and resistive training based on initial evaluation findings, risk stratification, comorbidities and participant's personal goals.     Expected Outcomes Short Term: Increase workloads from initial exercise prescription for resistance, speed, and METs.;Short Term: Perform resistance training exercises routinely during rehab and add in resistance training at home;Long Term: Improve cardiorespiratory fitness, muscular endurance and strength as measured by increased METs and functional capacity ( ) Short Term: Increase workloads from initial exercise prescription for resistance, speed, and METs.;Short Term: Perform resistance  training exercises routinely during rehab and add in resistance training at home;Long Term: Improve cardiorespiratory fitness, muscular endurance and strength as measured by increased METs and functional capacity ( ) Short Term: Increase workloads from initial exercise prescription for resistance, speed, and METs.;Short Term: Perform resistance training exercises routinely during rehab and add in resistance training at home;Long Term: Improve cardiorespiratory fitness, muscular endurance and strength as measured by increased METs and functional capacity ( )     Able to understand and use rate of perceived exertion (RPE) scale Yes Yes Yes     Intervention Provide education and explanation on how to use RPE scale Provide education and explanation on how to use RPE scale Provide education and explanation on how to use RPE scale     Expected Outcomes Short Term: Able to use RPE daily in rehab to express subjective intensity level;Long Term:  Able to use RPE to guide intensity level when exercising independently Short Term: Able to use RPE daily in rehab to express subjective intensity level;Long Term:  Able to use RPE to guide intensity level when exercising independently Short Term: Able to use RPE daily in rehab to express subjective intensity level;Long Term:  Able to use RPE to guide intensity level when exercising independently     Able to understand and use Dyspnea scale Yes Yes Yes     Intervention Provide education and explanation on how to use Dyspnea scale Provide education and explanation on how to use Dyspnea scale Provide education and explanation on how to use Dyspnea scale     Expected Outcomes Short Term: Able to use Dyspnea scale daily in rehab to express subjective sense of shortness of breath during exertion;Long Term: Able to use Dyspnea scale to guide intensity level  when exercising independently Short Term: Able to use Dyspnea scale daily in rehab to express subjective sense of  shortness of breath during exertion;Long Term: Able to use Dyspnea scale to guide intensity level when exercising independently Short Term: Able to use Dyspnea scale daily in rehab to express subjective sense of shortness of breath during exertion;Long Term: Able to use Dyspnea scale to guide intensity level when exercising independently     Knowledge and understanding of Target Heart Rate Range (THRR) Yes Yes Yes     Intervention Provide education and explanation of THRR including how the numbers were predicted and where they are located for reference Provide education and explanation of THRR including how the numbers were predicted and where they are located for reference Provide education and explanation of THRR including how the numbers were predicted and where they are located for reference     Expected Outcomes Short Term: Able to state/look up THRR;Short Term: Able to use daily as guideline for intensity in rehab;Long Term: Able to use THRR to govern intensity when exercising independently Short Term: Able to state/look up THRR;Short Term: Able to use daily as guideline for intensity in rehab;Long Term: Able to use THRR to govern intensity when exercising independently Short Term: Able to state/look up THRR;Short Term: Able to use daily as guideline for intensity in rehab;Long Term: Able to use THRR to govern intensity when exercising independently     Understanding of Exercise Prescription Yes Yes Yes     Intervention Provide education, explanation, and written materials on patient's individual exercise prescription Provide education, explanation, and written materials on patient's individual exercise prescription Provide education, explanation, and written materials on patient's individual exercise prescription     Expected Outcomes Short Term: Able to explain program exercise prescription;Long Term: Able to explain home exercise prescription to exercise independently Short Term: Able to explain  program exercise prescription;Long Term: Able to explain home exercise prescription to exercise independently Short Term: Able to explain program exercise prescription;Long Term: Able to explain home exercise prescription to exercise independently              Exercise Goals Re-Evaluation :  Exercise Goals Re-Evaluation     Row Name 06/17/23 1100 07/21/23 1609           Exercise Goal Re-Evaluation   Exercise Goals Review Increase Physical Activity;Able to understand and use Dyspnea scale;Understanding of Exercise Prescription;Increase Strength and Stamina;Knowledge and understanding of Target Heart Rate Range (THRR);Able to understand and use rate of perceived exertion (RPE) scale Increase Physical Activity;Able to understand and use Dyspnea scale;Understanding of Exercise Prescription;Increase Strength and Stamina;Knowledge and understanding of Target Heart Rate Range (THRR);Able to understand and use rate of perceived exertion (RPE) scale      Comments Candace Frey has completed 2 exercise sessions. She exercises for 15 min on the treadmill and recumbent elliptical. Candace Frey averages 2.53 METs at 2.0 mph and 0 incline on the treadmill and 1.7 METs at level 1 on the recumbent elliptical. She performs the warmup and cooldown standing without limitations. It is too soon to note any discernable progressions. Will continue to monitor and progress as able. Candace Frey has completed 9 exercise sessions. She exercises for 15 min on the treadmill and recumbent elliptical. Candace Frey averages 3.296 METs at 2.5 mph and 1 incline on the treadmill and 4.6 METs at level 3 on the recumbent elliptical. She performs the warmup and cooldown standing without limitations. Candace Frey has increased her workload for both exercise modes as she tolerates progressions  well. She is very motivated to exercise and is trying to understand her limits. Candace Frey feels she has benefitted thus far from PR. Will continue to monitor and progress as able.      Expected  Outcomes Through exercise at rehab and home, the patient will decrease shortness of breath with daily activities and feel confident in carrying out an exercise regimen at home. Through exercise at rehab and home, the patient will decrease shortness of breath with daily activities and feel confident in carrying out an exercise regimen at home.               Discharge Exercise Prescription (Final Exercise Prescription Changes):  Exercise Prescription Changes - 07/26/23 1100       Response to Exercise   Blood Pressure (Admit) 114/62    Blood Pressure (Exercise) 138/62    Blood Pressure (Exit) 68/60    Heart Rate (Admit) 71 bpm    Heart Rate (Exercise) 127 bpm    Heart Rate (Exit) 80 bpm    Oxygen Saturation (Admit) 97 %    Oxygen Saturation (Exercise) 94 %    Oxygen Saturation (Exit) 97 %    Rating of Perceived Exertion (Exercise) 11    Perceived Dyspnea (Exercise) 1    Duration Continue with 30 min of aerobic exercise without signs/symptoms of physical distress.    Intensity [facility] unchanged      Progression   Progression Continue to progress workloads to maintain intensity without signs/symptoms of physical distress.      Resistance Training   Training Prescription Yes    Weight blue bands    Reps 10-15    Time 10 Minutes      Treadmill   MPH 3.5    Grade 3    Minutes 15    METs 5.13      Recumbant Elliptical   Level 5    Minutes 15    METs 3.7             Nutrition:  Target Goals: Understanding of nutrition guidelines, daily intake of sodium 1500mg , cholesterol 200mg , calories 30% from fat and 7% or less from saturated fats, daily to have 5 or more servings of fruits and vegetables.  Biometrics:  Pre Biometrics - 06/06/23 1041       Pre Biometrics   Grip Strength 22 kg              Nutrition Therapy Plan and Nutrition Goals:  Nutrition Therapy & Goals - 07/19/23 1546       Nutrition Therapy   Diet General Healthy Diet      Personal  Nutrition Goals   Nutrition Goal Patient to improve diet quality by using the plate method as a guide for meal planning to include lean protein/plant protein, fruits, vegetables, whole grains, nonfat dairy as part of a well-balanced diet.    Comments Candace Frey reports no nutrition concerns at this time. Candace Frey is down ~4.8# since starting with our program; most recent TSH was low and medication was adjusted per documentation 07/18/23. BMI remains appropriate. Candace Frey will continue to benefit from participation in pulmonary rehab for nutrition and exercise support.      Intervention Plan   Intervention Prescribe, educate and counsel regarding individualized specific dietary modifications aiming towards targeted core components such as weight, hypertension, lipid management, diabetes, heart failure and other comorbidities.;Nutrition handout(s) given to patient.    Expected Outcomes Short Term Goal: Understand basic principles of dietary content, such as calories, fat, sodium,  cholesterol and nutrients.;Long Term Goal: Adherence to prescribed nutrition plan.             Nutrition Assessments:  MEDIFICTS Score Key: ?70 Need to make dietary changes  40-70 Heart Healthy Diet ? 40 Therapeutic Level Cholesterol Diet   Picture Your Plate Scores: <16 Unhealthy dietary pattern with much room for improvement. 41-50 Dietary pattern unlikely to meet recommendations for good health and room for improvement. 51-60 More healthful dietary pattern, with some room for improvement.  >60 Healthy dietary pattern, although there may be some specific behaviors that could be improved.    Nutrition Goals Re-Evaluation:  Nutrition Goals Re-Evaluation     Row Name 06/14/23 1149 07/19/23 1546           Goals   Current Weight 172 lb 13.5 oz (78.4 kg) 169 lb 15.6 oz (77.1 kg)      Comment A1c WNL, lipids WNL A1c WNL, lipids WNL      Expected Outcome Candace Frey reports no nutrition concerns at this time. Candace Frey will continue to  benefit from participation in pulmonary rehab for nutrition and exercise support. Candace Frey reports no nutrition concerns at this time. Candace Frey is down ~4.8# since starting with our program; most recent TSH was low and medication was adjusted per documentation 07/18/23. BMI remains appropriate. Candace Frey will continue to benefit from participation in pulmonary rehab for nutrition and exercise support.               Nutrition Goals Discharge (Final Nutrition Goals Re-Evaluation):  Nutrition Goals Re-Evaluation - 07/19/23 1546       Goals   Current Weight 169 lb 15.6 oz (77.1 kg)    Comment A1c WNL, lipids WNL    Expected Outcome Candace Frey reports no nutrition concerns at this time. Candace Frey is down ~4.8# since starting with our program; most recent TSH was low and medication was adjusted per documentation 07/18/23. BMI remains appropriate. Candace Frey will continue to benefit from participation in pulmonary rehab for nutrition and exercise support.             Psychosocial: Target Goals: Acknowledge presence or absence of significant depression and/or stress, maximize coping skills, provide positive support system. Participant is able to verbalize types and ability to use techniques and skills needed for reducing stress and depression.  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 Yes    Comments Has good support from daughter      Barriers   Psychosocial barriers to participate in program The patient should benefit from training in stress management and relaxation.      Screening Interventions   Interventions Encouraged to exercise    Expected Outcomes Long Term Goal: Stressors or current issues are controlled or eliminated.;Long Term goal: The participant  improves quality of Life and PHQ9 Scores as seen by post scores and/or verbalization of changes             Quality of Life Scores:  Scores of 19 and below usually indicate a poorer quality of life in these areas.  A difference of  2-3 points is a clinically meaningful difference.  A difference of 2-3 points in the total score of the  Quality of Life Index has been associated with significant improvement in overall quality of life, self-image, physical symptoms, and general health in studies assessing change in quality of life.  PHQ-9: Review Flowsheet  More data exists      07/18/2023 06/06/2023 01/11/2023 12/30/2022 12/25/2021  Depression screen PHQ 2/9  Decreased Interest 0 0 0 0 0  Down, Depressed, Hopeless 0 0 0 0 0  PHQ - 2 Score 0 0 0 0 0  Altered sleeping 0 0 0 - 0  Tired, decreased energy 1 1 0 - 0  Change in appetite 1 1 1  - 0  Feeling bad or failure about yourself  0 0 0 - 0  Trouble concentrating 0 0 0 - 0  Moving slowly or fidgety/restless 0 0 0 - 0  Suicidal thoughts 0 0 0 - 0  PHQ-9 Score 2 2 1  - 0  Difficult doing work/chores Not difficult at all Not difficult at all Not difficult at all - Not difficult at all    Details           Interpretation of Total Score  Total Score Depression Severity:  1-4 = Minimal depression, 5-9 = Mild depression, 10-14 = Moderate depression, 15-19 = Moderately severe depression, 20-27 = Severe depression   Psychosocial Evaluation and Intervention:  Psychosocial Evaluation - 06/06/23 1101       Psychosocial Evaluation & Interventions   Interventions Relaxation education;Encouraged to exercise with the program and follow exercise prescription    Comments Pt says she sometimes gets stress about taking care of her husband with dementia    Expected Outcomes For Candace Frey to decrease her stress and attend PR without any psychosocial barriers or concerns    Continue Psychosocial Services  No Follow up required             Psychosocial  Re-Evaluation:  Psychosocial Re-Evaluation     Row Name 06/22/23 1541 07/18/23 1437           Psychosocial Re-Evaluation   Current issues with Current Stress Concerns Current Stress Concerns      Comments Candace Frey is still dealing with the stress that comes with being her husbands caregiver. She has started exercising at a gym on the days she does not have PR. She feels this has helped her stress levels decrease. Staff will educate Candace Frey on healthy ways to cope with stress. Candace Frey is still dealing with the stress of her husband's declining health and cognition. Her husband also had a recent fall that has brought on some added stress. Candace Frey has been educated on healthy ways to manage her stress and has implemented them into her daily life.      Expected Outcomes For Candace Frey to participate in PR free of any psychosocial barrier or concerns. For Candace Frey to participate in PR free of any psychosocial barrier or concerns.      Interventions Encouraged to attend Pulmonary Rehabilitation for the exercise;Stress management education Encouraged to attend Pulmonary Rehabilitation for the exercise      Continue Psychosocial Services  Follow up required by staff Follow up required by staff               Psychosocial Discharge (Final Psychosocial Re-Evaluation):  Psychosocial Re-Evaluation - 07/18/23 1437       Psychosocial Re-Evaluation   Current issues with Current Stress Concerns    Comments Candace Frey is still dealing with the stress of her husband's declining health and cognition. Her husband also had a recent fall that has  brought on some added stress. Candace Frey has been educated on healthy ways to manage her stress and has implemented them into her daily life.    Expected Outcomes For Candace Frey to participate in PR free of any psychosocial barrier or concerns.    Interventions Encouraged to attend Pulmonary Rehabilitation for the exercise    Continue Psychosocial Services  Follow up required by staff              Education: Education Goals: Education classes will be provided on a weekly basis, covering required topics. Participant will state understanding/return demonstration of topics presented.  Learning Barriers/Preferences:   Education Topics: Introduction to Pulmonary Rehab Group instruction provided by PowerPoint, verbal discussion, and written material to support subject matter. Instructor reviews what Pulmonary Rehab is, the purpose of the program, and how patients are referred.     Know Your Numbers Group instruction that is supported by a PowerPoint presentation. Instructor discusses importance of knowing and understanding resting, exercise, and post-exercise oxygen saturation, heart rate, and blood pressure. Oxygen saturation, heart rate, blood pressure, rating of perceived exertion, and dyspnea are reviewed along with a normal range for these values.    Exercise for the Pulmonary Patient Group instruction that is supported by a PowerPoint presentation. Instructor discusses benefits of exercise, core components of exercise, frequency, duration, and intensity of an exercise routine, importance of utilizing pulse oximetry during exercise, safety while exercising, and options of places to exercise outside of rehab.       MET Level  Group instruction provided by PowerPoint, verbal discussion, and written material to support subject matter. Instructor reviews what METs are and how to increase METs.  Flowsheet Row PULMONARY REHAB OTHER RESPIRATORY from 07/21/2023 in [facility] for Heart, Vascular, & Lung Health  Date 07/21/23  Educator EP  Instruction Review Code 1- Verbalizes Understanding       Pulmonary Medications Verbally interactive group education provided by instructor with focus on inhaled medications and proper administration.   Anatomy and Physiology of the Respiratory System Group instruction provided by PowerPoint, verbal discussion, and  written material to support subject matter. Instructor reviews respiratory cycle and anatomical components of the respiratory system and their functions. Instructor also reviews differences in obstructive and restrictive respiratory diseases with examples of each.    Oxygen Safety Group instruction provided by PowerPoint, verbal discussion, and written material to support subject matter. There is an overview of "What is Oxygen" and "Why do we need it".  Instructor also reviews how to create a safe environment for oxygen use, the importance of using oxygen as prescribed, and the risks of noncompliance. There is a brief discussion on traveling with oxygen and resources the patient may utilize.   Oxygen Use Group instruction provided by PowerPoint, verbal discussion, and written material to discuss how supplemental oxygen is prescribed and different types of oxygen supply systems. Resources for more information are provided.  Flowsheet Row PULMONARY REHAB OTHER RESPIRATORY from 06/16/2023 in [facility] for Heart, Vascular, & Lung Health  Date 06/16/23  Educator RT  Instruction Review Code 1- Verbalizes Understanding       Breathing Techniques Group instruction that is supported by demonstration and informational handouts. Instructor discusses the benefits of pursed lip and diaphragmatic breathing and detailed demonstration on how to perform both.  Flowsheet Row PULMONARY REHAB OTHER RESPIRATORY from 06/23/2023 in [facility] for Heart, Vascular, & Lung Health  Date 06/23/23  Educator RN  Instruction  Review Code 1- Verbalizes Understanding        Risk Factor Reduction Group instruction that is supported by a PowerPoint presentation. Instructor discusses the definition of a risk factor, different risk factors for pulmonary disease, and how the heart and lungs work together. Flowsheet Row PULMONARY REHAB OTHER RESPIRATORY from 07/14/2023 in [facility] for Heart, Vascular, & Lung Health  Date 07/14/23  Educator EP  Instruction Review Code 1- Verbalizes Understanding       MD Day A group question and answer session with a medical doctor that allows participants to ask questions that relate to their pulmonary disease state.   Nutrition for the Pulmonary Patient Group instruction provided by PowerPoint slides, verbal discussion, and written materials to support subject matter. The instructor gives an explanation and review of healthy diet recommendations, which includes a discussion on weight management, recommendations for fruit and vegetable consumption, as well as protein, fluid, caffeine, fiber, sodium, sugar, and alcohol. Tips for eating when patients are short of breath are discussed.    Other Education Group or individual verbal, written, or video instructions that support the educational goals of the pulmonary rehab program. Flowsheet Row PULMONARY REHAB OTHER RESPIRATORY from 07/07/2023 in [facility] for Heart, Vascular, & Lung Health  Date 07/07/23  Educator RN  Instruction Review Code 1- Verbalizes Understanding        Knowledge Questionnaire Score:  Knowledge Questionnaire Score - 06/06/23 1120       Knowledge Questionnaire Score   Pre Score 16/18             Core Components/Risk Factors/Patient Goals at Admission:  Personal Goals and Risk Factors at Admission - 06/06/23 1101       Core Components/Risk Factors/Patient Goals on Admission    Weight Management Weight Loss    Improve shortness of breath with ADL's Yes    Intervention Provide education, individualized exercise plan and daily activity instruction to help decrease symptoms of SOB with activities of daily living.    Expected Outcomes Long Term: Be able to perform more ADLs without symptoms or delay the onset of symptoms;Short Term: Improve cardiorespiratory fitness to achieve a reduction of  symptoms when performing ADLs             Core Components/Risk Factors/Patient Goals Review:   Goals and Risk Factor Review     Row Name 06/22/23 1545 07/18/23 1444           Core Components/Risk Factors/Patient Goals Review   Personal Goals Review Weight Management/Obesity;Improve shortness of breath with ADL's;Develop more efficient breathing techniques such as purse lipped breathing and diaphragmatic breathing and practicing self-pacing with activity. Weight Management/Obesity;Improve shortness of breath with ADL's      Review Goal progressing for wieght loss. Candace Frey is working with staff dietician to achieve weight loss goals. Goal progressing on improving her shortness of breath with ADLs. Goal progressing on developing more efficient breathing techniques such as purse lipped breathing and diaphragmatic breathing; and practicing self-pacing with activity. Candace Frey has only attended 3 classes. We will continue to monitor her progress. Goal progressing for wieght loss. Candace Frey is working with staff dietician to achieve weight loss goals. Goal progressing on improving her shortness of breath with ADLs. Goal met on developing more efficient breathing techniques such as purse lipped breathing and diaphragmatic breathing; and practicing self-pacing with activity. Candace Frey is able to demonstrate purse lip breathing when she gets short of breath. We will continue to monitor  her progress.      Expected Outcomes See admission goals See admission goals               Core Components/Risk Factors/Patient Goals at Discharge (Final Review):   Goals and Risk Factor Review - 07/18/23 1444       Core Components/Risk Factors/Patient Goals Review   Personal Goals Review Weight Management/Obesity;Improve shortness of breath with ADL's    Review Goal progressing for wieght loss. Candace Frey is working with staff dietician to achieve weight loss goals. Goal progressing on improving her shortness of breath with ADLs. Goal met  on developing more efficient breathing techniques such as purse lipped breathing and diaphragmatic breathing; and practicing self-pacing with activity. Candace Frey is able to demonstrate purse lip breathing when she gets short of breath. We will continue to monitor her progress.    Expected Outcomes See admission goals             ITP Comments:Pt is making expected progress toward Pulmonary Rehab goals after completing 10 sessions. Recommend continued exercise, life style modification, education, and utilization of breathing techniques to increase stamina and strength, while also decreasing shortness of breath with exertion.  Dr. Mechele Collin is Medical Director for Pulmonary Rehab at [facility].     Comments: Dr. Mechele Collin is Medical Director for Pulmonary Rehab at [facility].

## 2023-07-28 ENCOUNTER — Encounter (HOSPITAL_COMMUNITY)
Admission: RE | Admit: 2023-07-28 | Discharge: 2023-07-28 | Disposition: A | Discharge status: Home / Self Care | Payer: Medicare Other | Source: Ambulatory Visit | Attending: Pulmonary Disease | Admitting: Pulmonary Disease

## 2023-07-28 DIAGNOSIS — Z87891 Personal history of nicotine dependence: Secondary | ICD-10-CM | POA: Diagnosis not present

## 2023-07-28 DIAGNOSIS — J449 Chronic obstructive pulmonary disease, unspecified: Secondary | ICD-10-CM

## 2023-07-28 DIAGNOSIS — Z79899 Other long term (current) drug therapy: Secondary | ICD-10-CM | POA: Diagnosis not present

## 2023-07-28 NOTE — Progress Notes (Signed)
Daily Session Note  Patient Details  Name: Candace Frey MRN: [id number] Date of Birth: 21-Sep-1951 Referring Provider:   [name] Pulmonary Rehab Walk Test from 06/06/2023 in [facility] for Heart, Vascular, & Lung Health  Referring Provider Icard       Encounter Date: 07/28/2023  Check In:  Session Check In - 07/28/23 1144       Check-In   Supervising physician immediately available to respond to emergencies [facility] MD immediately available    Physician(s) Elizabeth Sauer, NP    Location [facility]    Staff Present Elissa Lovett BS, ACSM-CEP, Exercise Physiologist;Kaylee Earlene Plater, MS, ACSM-CEP, Exercise Physiologist;Zeb Rawl Hermine Messick Bel[place], RD, Dutch Gray, RN, BSN    Virtual Visit No    Medication changes reported     No    Fall or balance concerns reported    No    Tobacco Cessation No Change    Warm-up and Cool-down Performed as group-led instruction    Resistance Training Performed Yes    VAD Patient? No    PAD/SET Patient? No      Pain Assessment   Currently in Pain? No/denies             Capillary Blood Glucose: No results found for this or any previous visit (from the past 24 hour(s)).    Social History   Tobacco Use  Smoking Status Former   Current packs/day: 0.00   Average packs/day: 2.0 packs/day for 43.0 years (86.0 ttl pk-yrs)   Types: Cigarettes   Start date: 11/23/1963   Quit date: 11/22/2006   Years since quitting: 16.6  Smokeless Tobacco Never    Goals Met:  Proper associated with RPD/PD & O2 Sat Independence with exercise equipment Exercise tolerated well No report of concerns or symptoms today Strength training completed today  Goals Unmet:  Not Applicable  Comments: Service time is from 1013 to 1149.    Dr. Mechele Collin is Medical Director for Pulmonary Rehab at [facility].

## 2023-08-02 ENCOUNTER — Encounter (HOSPITAL_COMMUNITY)
Admission: RE | Admit: 2023-08-02 | Discharge: 2023-08-02 | Disposition: A | Discharge status: Home / Self Care | Payer: Medicare Other | Source: Ambulatory Visit | Attending: Pulmonary Disease | Admitting: Pulmonary Disease

## 2023-08-02 ENCOUNTER — Telehealth (HOSPITAL_COMMUNITY): Payer: Self-pay

## 2023-08-02 VITALS — Wt 168.2 lb

## 2023-08-02 DIAGNOSIS — J449 Chronic obstructive pulmonary disease, unspecified: Secondary | ICD-10-CM | POA: Diagnosis not present

## 2023-08-02 DIAGNOSIS — Z87891 Personal history of nicotine dependence: Secondary | ICD-10-CM | POA: Diagnosis not present

## 2023-08-02 DIAGNOSIS — Z79899 Other long term (current) drug therapy: Secondary | ICD-10-CM | POA: Diagnosis not present

## 2023-08-02 NOTE — Progress Notes (Signed)
Daily Session Note  Patient Details  Name: Candace Frey MRN: [id number] Date of Birth: [birth year] Referring Provider:   [name] Pulmonary Rehab Walk Test from 06/06/2023 in [facility] for Heart, Vascular, & Lung Health  Referring Provider Icard       Encounter Date: 08/02/2023  Check In:  Session Check In - 08/02/23 1214       Check-In   Supervising physician immediately available to respond to emergencies [facility] MD immediately available    Physician(s) Elizabeth Sauer, NP    Location [facility]    Staff Present Elissa Lovett BS, ACSM-CEP, Exercise Physiologist;Kaylee Earlene Plater, MS, ACSM-CEP, Exercise Physiologist;Casey Hermine Messick [place], RD, Dutch Gray, RN, BSN    Virtual Visit No    Medication changes reported     No    Tobacco Cessation No Change    Warm-up and Cool-down Performed as group-led instruction    Resistance Training Performed Yes    VAD Patient? No    PAD/SET Patient? No      Pain Assessment   Currently in Pain? No/denies             Capillary Blood Glucose: No results found for this or any previous visit (from the past 24 hour(s)).    Social History   Tobacco Use  Smoking Status Former   Current packs/day: 0.00   Average packs/day: 2.0 packs/day for 43.0 years (86.0 ttl pk-yrs)   Types: Cigarettes   Start date: 11/23/1963   Quit date: 11/22/2006   Years since quitting: 16.7  Smokeless Tobacco Never    Goals Met:  Independence with exercise equipment Exercise tolerated well No report of concerns or symptoms today Strength training completed today  Goals Unmet:  Not Applicable  Comments: Service time is from 1021 to 1149    Dr. Mechele Collin is Medical Director for Pulmonary Rehab at [facility].

## 2023-08-02 NOTE — Telephone Encounter (Signed)
Dr. Tonia Brooms, Can we have an increase in Lou's target heart rate to 130 for exercise in PR?

## 2023-08-04 ENCOUNTER — Encounter (HOSPITAL_COMMUNITY): Payer: Medicare Other

## 2023-08-09 ENCOUNTER — Encounter (HOSPITAL_COMMUNITY): Payer: Medicare Other

## 2023-08-10 ENCOUNTER — Telehealth (HOSPITAL_COMMUNITY): Payer: Self-pay
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
## 2023-08-11 ENCOUNTER — Encounter (HOSPITAL_COMMUNITY): Payer: Medicare Other

## 2023-08-16 ENCOUNTER — Encounter (HOSPITAL_COMMUNITY)
Admission: RE | Admit: 2023-08-16 | Discharge: 2023-08-16 | Disposition: A | Discharge status: Home / Self Care | Payer: Medicare Other | Source: Ambulatory Visit | Attending: Pulmonary Disease

## 2023-08-16 DIAGNOSIS — Z79899 Other long term (current) drug therapy: Secondary | ICD-10-CM | POA: Diagnosis not present

## 2023-08-16 DIAGNOSIS — J449 Chronic obstructive pulmonary disease, unspecified: Secondary | ICD-10-CM

## 2023-08-16 DIAGNOSIS — Z87891 Personal history of nicotine dependence: Secondary | ICD-10-CM | POA: Diagnosis not present

## 2023-08-16 NOTE — Progress Notes (Signed)
Daily Session Note  Patient Details  Name: Candace Frey MRN: [id number] Date of Birth: 22-Apr-1951 Referring Provider:   [name] Pulmonary Rehab Walk Test from 06/06/2023 in [facility] for Heart, Vascular, & Lung Health  Referring Provider Icard       Encounter Date: 08/16/2023  Check In:  Session Check In - 08/16/23 1203       Check-In   Supervising physician immediately available to respond to emergencies [facility] MD immediately available    Physician(s) Carlos Levering, NP    [facility]    Staff Present Elissa Lovett BS, ACSM-CEP, Exercise Physiologist;Kaylee Earlene Plater, MS, ACSM-CEP, Exercise Physiologist;Liora Myles Synthia Innocent, RN, BSN    Virtual Visit No    Medication changes reported     No    Tobacco Cessation No Change    Warm-up and Cool-down Performed as group-led instruction    Resistance Training Performed Yes    VAD Patient? No    PAD/SET Patient? No      Pain Assessment   Currently in Pain? No/denies    Multiple Pain Sites No             Capillary Blood Glucose: No results found for this or any previous visit (from the past 24 hour(s)).    Social History   Tobacco Use  Smoking Status Former   Current packs/day: 0.00   Average packs/day: 2.0 packs/day for 43.0 years (86.0 ttl pk-yrs)   Types: Cigarettes   Start date: 11/23/1963   Quit date: 11/22/2006   Years since quitting: 16.7  Smokeless Tobacco Never    Goals Met:  Proper associated with RPD/PD & O2 Sat Independence with exercise equipment Exercise tolerated well No report of concerns or symptoms today Strength training completed today  Goals Unmet:  Not Applicable  Comments: Service time is from 1015 to 1140.    Dr. Mechele Collin is Medical Director for Pulmonary Rehab at [facility].

## 2023-08-18 ENCOUNTER — Encounter (HOSPITAL_COMMUNITY)
Admission: RE | Admit: 2023-08-18 | Discharge: 2023-08-18 | Disposition: A | Discharge status: Home / Self Care | Payer: Medicare Other | Source: Ambulatory Visit | Attending: Pulmonary Disease | Admitting: Pulmonary Disease

## 2023-08-18 DIAGNOSIS — J449 Chronic obstructive pulmonary disease, unspecified: Secondary | ICD-10-CM | POA: Diagnosis not present

## 2023-08-18 DIAGNOSIS — Z79899 Other long term (current) drug therapy: Secondary | ICD-10-CM | POA: Diagnosis not present

## 2023-08-18 DIAGNOSIS — Z87891 Personal history of nicotine dependence: Secondary | ICD-10-CM | POA: Diagnosis not present

## 2023-08-18 NOTE — Progress Notes (Signed)
Daily Session Note  Patient Details  Name: Candace Frey MRN: [id number] Date of Birth: 14-Mar-1951 Referring Provider:   [name] Pulmonary Rehab Walk Test from 06/06/2023 in [facility]  Referring Provider Icard       Encounter Date: 08/18/2023  Check In:  Session Check In - 08/18/23 1039       Check-In   Supervising physician immediately available to respond to emergencies [facility] MD immediately available    Physician(s) Edd Fabian, NP    Location [facility]    Staff Present Elissa Lovett BS, ACSM-CEP, Exercise Physiologist;Kaylee Earlene Plater, MS, ACSM-CEP, Exercise Physiologist;Landri Dorsainvil Synthia Innocent, RN, BSN;Samantha Belarus, RD, LDN    Virtual Visit No    Medication changes reported     No    Fall or balance concerns reported    No    Tobacco Cessation No Change    Warm-up and Cool-down Performed as group-led instruction    Resistance Training Performed Yes    VAD Patient? No    PAD/SET Patient? No      Pain Assessment   Currently in Pain? No/denies    Multiple Pain Sites No             Capillary Blood Glucose: No results found for this or any previous visit (from the past 24 hour(s)).    Social History   Tobacco Use  Smoking Status Former   Current packs/day: 0.00   Average packs/day: 2.0 packs/day for 43.0 years (86.0 ttl pk-yrs)   Types: Cigarettes   Start date: 11/23/1963   Quit date: 11/22/2006   Years since quitting: 16.7  Smokeless Tobacco Never    Goals Met:  Proper associated with RPD/PD & O2 Sat Independence with exercise equipment Exercise tolerated well No report of concerns or symptoms today Strength training completed today  Goals Unmet:  Not Applicable  Comments: Service time is from 1020 to 1150.    Dr. Mechele Collin is Medical Director for Pulmonary Rehab at [facility].

## 2023-08-23 ENCOUNTER — Encounter (HOSPITAL_COMMUNITY)
Admission: RE | Admit: 2023-08-23 | Discharge: 2023-08-23 | Disposition: A | Discharge status: Home / Self Care | Payer: Medicare Other | Source: Ambulatory Visit | Attending: Pulmonary Disease | Admitting: Pulmonary Disease

## 2023-08-23 VITALS — Wt 165.6 lb

## 2023-08-23 DIAGNOSIS — J449 Chronic obstructive pulmonary disease, unspecified: Secondary | ICD-10-CM | POA: Insufficient documentation

## 2023-08-23 NOTE — Progress Notes (Signed)
Daily Session Note  Patient Details  Name: Candace Frey MRN: [id number] Date of Birth: 05/02/1951 Referring Provider:   [name] Pulmonary Rehab Walk Test from 06/06/2023 in [facility]  Referring Provider Icard       Encounter Date: 08/23/2023  Check In:  Session Check In - 08/23/23 1128       Check-In   Supervising physician immediately available to respond to emergencies [facility] MD immediately available    Physician(s) Robin Searing, NP    Location [facility]    Staff Present Elissa Lovett BS, ACSM-CEP, Exercise Physiologist;Challis Crill Earlene Plater, MS, ACSM-CEP, Exercise Physiologist;Casey Synthia Innocent, RN, BSN;Samantha Belarus, RD, LDN    Virtual Visit No    Medication changes reported     No    Fall or balance concerns reported    No    Tobacco Cessation No Change    Warm-up and Cool-down Performed as group-led instruction    Resistance Training Performed Yes    VAD Patient? No    PAD/SET Patient? No      Pain Assessment   Currently in Pain? No/denies    Multiple Pain Sites No             Capillary Blood Glucose: No results found for this or any previous visit (from the past 24 hour(s)).   Exercise Prescription Changes - 08/23/23 1200       Response to Exercise   Blood Pressure (Admit) 116/54    Blood Pressure (Exercise) 130/50    Blood Pressure (Exit) 114/62    Heart Rate (Admit) 74 bpm    Heart Rate (Exercise) 97 bpm    Heart Rate (Exit) 96 bpm    Oxygen Saturation (Admit) 97 %    Oxygen Saturation (Exercise) 94 %    Oxygen Saturation (Exit) 95 %    Rating of Perceived Exertion (Exercise) 13    Perceived Dyspnea (Exercise) 2    Duration Continue with 30 min of aerobic exercise without signs/symptoms of physical distress.    Intensity THRR New      Progression   Progression Continue to progress workloads to maintain intensity without signs/symptoms of physical distress.       Resistance Training   Training Prescription Yes    Weight black bands    Reps 10-15    Time 10 Minutes      Interval Training   Interval Training Yes    Equipment Treadmill;Recumbant Elliptical    Comments Treadmill: 3.5 and 3.2 for 2 min, Recumbent elliptical: L8 and L6 for 2 min      Treadmill   MPH 3.5    Grade 0    Minutes 15    METs 3.68      Recumbant Elliptical   Level 8    Minutes 15    METs 4.8             Social History   Tobacco Use  Smoking Status Former   Current packs/day: 0.00   Average packs/day: 2.0 packs/day for 43.0 years (86.0 ttl pk-yrs)   Types: Cigarettes   Start date: 11/23/1963   Quit date: 11/22/2006   Years since quitting: 16.7  Smokeless Tobacco Never    Goals Met:  Proper associated with RPD/PD & O2 Sat Independence with exercise equipment Exercise tolerated well No report of concerns or symptoms today Strength training completed today  Goals Unmet:  Not Applicable  Comments: Service time is  from 1009 to 1150.    Dr. Mechele Collin is Medical Director for Pulmonary Rehab at [facility].

## 2023-08-24 NOTE — Progress Notes (Signed)
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 , Disp: , Rfl:    Estradiol 0.75 MG/1.25 GM (0.06%) topical gel, Place 1.25 g onto the skin daily., Disp: , Rfl:    levothyroxine (SYNTHROID) 88 MCG tablet, Take 1 tablet (88 mcg total) by mouth daily., Disp: 90 tablet, Rfl: 3   Multiple Vitamins-Minerals (PRESERVISION AREDS 2 PO), Take 1 tablet by mouth 2 (two) times daily., Disp: , Rfl:   Past Medical History: Past Medical History:  Diagnosis Date   Acute deep vein thrombosis (DVT) of distal vein of right lower extremity (HCC) 07/22/2016   Acute medial meniscal tear 01/29/2015   Chicken pox    Chronic obstructive lung disease (HCC) 05/23/2019   Colon polyps    Complication of anesthesia    slow to wake up    COPD (chronic obstructive pulmonary disease) (HCC)    Genital warts    Multiple pulmonary nodules determined by computed tomography of lung 07/05/2017   CT 03/03/17  Right lower lobe nodularity described on the prior exam is less  apparent today, favored to represent an area of scarring. other tiny nodules described on the prior are not readily apparent. No enlarging or dominant nodule identified > rec continue low dose annual  screening until 2023 per PCP (pt has fm hx lung ca)    Osteopenia    Shortness of breath dyspnea    with exertion was a smoker for many year per patient    Thyroid disease    UTI (urinary tract infection)     Tobacco Use: Social History   Tobacco Use  Smoking Status Former   Current packs/day: 0.00   Average packs/day: 2.0 packs/day for 43.0 years (86.0 ttl pk-yrs)   Types: Cigarettes   Start date: 11/23/1963   Quit date: 11/22/2006   Years since quitting: 16.7  Smokeless Tobacco Never    Labs: Review Flowsheet       Latest Ref Rng & Units 07/14/2020 12/25/2021 01/11/2023 01/17/2023  Labs for ITP Cardiac and Pulmonary Rehab  Cholestrol 0 - 200 mg/dL 010  272  536  -  LDL (calc) 0 - 99 mg/dL 75  83  78  -  HDL-C >64.40 mg/dL 81  34.74  25.95  -  Trlycerides 0.0 - 149.0 mg/dL 75  638.7  56.4  -  Hemoglobin A1c 4.6 - 6.5 % - - - 5.6     Details            Capillary Blood Glucose: No results found for: "GLUCAP"  Pulmonary Assessment Scores:  Pulmonary Assessment Scores     Row Name 06/06/23 1115         ADL UCSD   SOB Score total 28       CAT Score   CAT Score 8       mMRC Score   mMRC Score 2             UCSD: Self-administered rating of dyspnea associated with activities of daily living (ADLs) 6-point scale (0 = "not at all" to 5 = "maximal or unable to do because of breathlessness")  Scoring Scores range from 0 to 120.  Minimally important difference is 5 units  CAT: CAT can identify the health impairment of COPD patients and is better correlated with disease progression.  CAT has a scoring range of zero to 40. The CAT score is classified into four groups of low (less than 10), medium (10 - 20), high (21-30) and very high (31-40) based on the impact level  of disease on health status. A CAT score over 10 suggests significant symptoms.  A worsening CAT score could be explained by an exacerbation, poor medication adherence, poor inhaler technique, or progression of COPD or comorbid conditions.  CAT MCID is 2 points  mMRC: mMRC (Modified Medical Research Council) Dyspnea Scale is used to assess the degree of baseline functional disability in patients of respiratory disease due to dyspnea. No minimal important difference is established. A decrease in score of 1 point or greater is considered a positive change.   Pulmonary Function Assessment:  Pulmonary Function Assessment - 06/06/23 1100       Breath   Bilateral Breath Sounds Decreased    Shortness of Breath Yes;Fear of Shortness of Breath;Limiting activity             Exercise Target Goals: Exercise Program Goal: Individual exercise prescription set using results from initial 6 min walk test and THRR while considering  patient's activity barriers and safety.   Exercise Prescription Goal: Initial exercise prescription builds to 30-45 minutes a day of aerobic activity, 2-3 days per week.  Home exercise guidelines will be given to patient during program as part of exercise prescription that the participant will acknowledge.  Activity Barriers & Risk Stratification:  Activity Barriers & Cardiac Risk Stratification - 06/06/23 1054       Activity Barriers & Cardiac Risk Stratification   Activity Barriers Other (comment);Right Knee Replacement;Muscular Weakness;Deconditioning;Shortness of Breath    Comments Right hip pain             6 Minute Walk:  6 Minute Walk     Row Name 06/06/23 1212         6 Minute Walk   Phase Initial     Distance 1290 feet     Walk Time 6 minutes     # of Rest Breaks 0     MPH 2.44     METS 3.11     RPE 13     Perceived Dyspnea  2.5     VO2 Peak 10.87     Symptoms No     Resting HR 73 bpm     Resting BP 112/64     Resting Oxygen Saturation   98 %     Exercise Oxygen Saturation  during 6 min walk 93 %     Max Ex. HR 121 bpm     Max Ex. BP 142/68     2 Minute Post BP 120/68  Interval HR   1 Minute HR 101     2 Minute HR 111     3 Minute HR 111     4 Minute HR 121     5 Minute HR 118     6 Minute HR 117     2 Minute Post HR 84     Interval Heart Rate? Yes       Interval Oxygen   Interval Oxygen? Yes     Baseline Oxygen Saturation % 98 %     1 Minute Oxygen Saturation % 97 %     1 Minute Liters of Oxygen 0 L     2 Minute Oxygen Saturation % 96 %     2 Minute Liters of Oxygen 0 L     3 Minute Oxygen Saturation % 95 %     3 Minute Liters of Oxygen 0 L     4 Minute Oxygen Saturation % 93 %     4 Minute Liters of Oxygen 0 L     5 Minute Oxygen Saturation % 94 %     5 Minute Liters of Oxygen 0 L     6 Minute Oxygen Saturation % 97 %     6 Minute Liters of Oxygen 0 L     2 Minute Post Oxygen Saturation % 99 %     2 Minute Post Liters of Oxygen 0 L              Oxygen Initial Assessment:  Oxygen Initial Assessment - 06/06/23 1059       Home Oxygen   Home Oxygen Device None    Sleep Oxygen Prescription None    Home Exercise Oxygen Prescription None    Home Resting Oxygen Prescription None      Initial 6 min Walk   Oxygen Used None      Program Oxygen Prescription   Program Oxygen Prescription None      Intervention   Short Term Goals To learn and understand importance of maintaining oxygen saturations>88%;To learn and understand importance of monitoring SPO2 with pulse oximeter and demonstrate accurate use of the pulse oximeter.;To learn and demonstrate proper pursed lip breathing techniques or other breathing techniques. ;To learn and demonstrate proper use of respiratory medications    Long  Term Goals Maintenance of O2 saturations>88%;Verbalizes importance of monitoring SPO2 with pulse oximeter and return demonstration;Exhibits proper breathing techniques, such as pursed lip breathing or other  method taught during program session             Oxygen Re-Evaluation:  Oxygen Re-Evaluation     Row Name 06/17/23 1102 07/21/23 1611 08/18/23 0837         Program Oxygen Prescription   Program Oxygen Prescription None None None       Home Oxygen   Home Oxygen Device None None None     Sleep Oxygen Prescription None None None     Home Exercise Oxygen Prescription None None None     Home Resting Oxygen Prescription None None None       Goals/Expected Outcomes   Short Term Goals To learn and understand importance of maintaining oxygen saturations>88%;To learn and understand importance of monitoring SPO2 with pulse oximeter and demonstrate accurate use of the pulse oximeter.;To learn and demonstrate proper pursed lip breathing techniques or other breathing techniques. ;To learn and demonstrate proper use of respiratory medications To learn and understand importance of maintaining oxygen saturations>88%;To learn and understand importance of monitoring  SPO2 with pulse oximeter and demonstrate accurate use of the pulse oximeter.;To learn and demonstrate proper pursed lip breathing techniques or other breathing techniques. ;To learn and demonstrate proper use of respiratory medications To learn and understand importance of maintaining oxygen saturations>88%;To learn and understand importance of monitoring SPO2 with pulse oximeter and demonstrate accurate use of the pulse oximeter.;To learn and demonstrate proper pursed lip breathing techniques or other breathing techniques. ;To learn and demonstrate proper use of respiratory medications     Long  Term Goals Maintenance of O2 saturations>88%;Verbalizes importance of monitoring SPO2 with pulse oximeter and return demonstration;Exhibits proper breathing techniques, such as pursed lip breathing or other method taught during program session Maintenance of O2 saturations>88%;Verbalizes importance of monitoring SPO2 with pulse oximeter and return  demonstration;Exhibits proper breathing techniques, such as pursed lip breathing or other method taught during program session Maintenance of O2 saturations>88%;Verbalizes importance of monitoring SPO2 with pulse oximeter and return demonstration;Exhibits proper breathing techniques, such as pursed lip breathing or other method taught during program session     Goals/Expected Outcomes Compliance and understanding of oxygen saturation monitoring and breathing techniques to decrease shortness of breath. Compliance and understanding of oxygen saturation monitoring and breathing techniques to decrease shortness of breath. Compliance and understanding of oxygen saturation monitoring and breathing techniques to decrease shortness of breath.              Oxygen Discharge (Final Oxygen Re-Evaluation):  Oxygen Re-Evaluation - 08/18/23 0837       Program Oxygen Prescription   Program Oxygen Prescription None      Home Oxygen   Home Oxygen Device None    Sleep Oxygen Prescription None    Home Exercise Oxygen Prescription None    Home Resting Oxygen Prescription None      Goals/Expected Outcomes   Short Term Goals To learn and understand importance of maintaining oxygen saturations>88%;To learn and understand importance of monitoring SPO2 with pulse oximeter and demonstrate accurate use of the pulse oximeter.;To learn and demonstrate proper pursed lip breathing techniques or other breathing techniques. ;To learn and demonstrate proper use of respiratory medications    Long  Term Goals Maintenance of O2 saturations>88%;Verbalizes importance of monitoring SPO2 with pulse oximeter and return demonstration;Exhibits proper breathing techniques, such as pursed lip breathing or other method taught during program session    Goals/Expected Outcomes Compliance and understanding of oxygen saturation monitoring and breathing techniques to decrease shortness of breath.             Initial Exercise  Prescription:  Initial Exercise Prescription - 06/06/23 1200       Date of Initial Exercise RX and Referring Provider   Date 06/06/23    Referring Provider Icard    Expected Discharge Date 09/01/23      Treadmill   MPH 2    Grade 0    Minutes 15      Recumbant Elliptical   Level 1    Minutes 15    METs 2      Prescription Details   Frequency (times per week) 2    Duration Progress to 30 minutes of continuous aerobic without signs/symptoms of physical distress      Intensity   THRR 40-80% of Max Heartrate 58-117    Ratings of Perceived Exertion 11-13    Perceived Dyspnea 0-4      Progression   Progression Continue to progress workloads to maintain intensity without signs/symptoms of physical distress.      Resistance Training  Training Prescription Yes    Weight blue bands    Reps 10-15             Perform Capillary Blood Glucose checks as needed.  Exercise Prescription Changes:   Exercise Prescription Changes     Row Name 06/14/23 1200 06/28/23 1200 07/07/23 1213 07/26/23 1100 08/02/23 1155     Response to Exercise   Blood Pressure (Admit) 114/60 100/58 102/58 114/62 100/70   Blood Pressure (Exercise) 124/66 132/68 -- 138/62 --   Blood Pressure (Exit) 122/70 96/58 94/54  68/60 120/64   Heart Rate (Admit) 71 bpm 76 bpm 75 bpm 71 bpm 70 bpm   Heart Rate (Exercise) 93 bpm 92 bpm 90 bpm 127 bpm 123 bpm   Heart Rate (Exit) 79 bpm 86 bpm 88 bpm 80 bpm 81 bpm   Oxygen Saturation (Admit) 98 % 97 % 97 % 97 % 98 %   Oxygen Saturation (Exercise) 96 % 95 % 97 % 94 % 95 %   Oxygen Saturation (Exit) 96 % 95 % 95 % 97 % 95 %   Rating of Perceived Exertion (Exercise) 11 9 11 11 13    Perceived Dyspnea (Exercise) 1 0 1 1 2    Duration Progress to 30 minutes of  aerobic without signs/symptoms of physical distress Continue with 30 min of aerobic exercise without signs/symptoms of physical distress. Continue with 30 min of aerobic exercise without signs/symptoms of physical  distress. Continue with 30 min of aerobic exercise without signs/symptoms of physical distress. Continue with 30 min of aerobic exercise without signs/symptoms of physical distress.   Intensity THRR unchanged THRR unchanged THRR unchanged THRR unchanged THRR unchanged     Progression   Progression Continue to progress workloads to maintain intensity without signs/symptoms of physical distress. Continue to progress workloads to maintain intensity without signs/symptoms of physical distress. Continue to progress workloads to maintain intensity without signs/symptoms of physical distress. Continue to progress workloads to maintain intensity without signs/symptoms of physical distress. Continue to progress workloads to maintain intensity without signs/symptoms of physical distress.   Average METs -- -- -- -- 4.5     Resistance Training   Training Prescription Yes Yes Yes Yes Yes   Weight blue bands blue bands blue bands blue bands black bands   Reps 10-15 10-15 10-15 10-15 10-15   Time 10 Minutes 10 Minutes 10 Minutes 10 Minutes 10 Minutes     Interval Training   Interval Training -- -- -- Yes Yes   Equipment -- -- -- Treadmill;Recumbant Elliptical Treadmill;Recumbant Elliptical   Comments -- -- -- Treadmill: 3 @ 2.5% for 1 min, 3.5 @ 3% for 2 min. Octane level 8 for 2 min, level 4 for 1 min Treadmill: 3.2 @ 4% for 1 min, 3.0 @ 3% for 2 min. Octane level 8 for 2 min, level 5 for 1 min     Treadmill   MPH 2 2.8 3 3.5 3.2   Grade 0 3 4 3 4    Minutes 15 15 15 15 15    METs 2.53 4.3 4.95 5.13 5.2     Recumbant Elliptical   Level 1 2 3 5 5    Minutes 15 15 15 15 15    METs 2.3 3.4 4.2 3.7 4.5    Row Name 08/23/23 1200             Response to Exercise   Blood Pressure (Admit) 116/54       Blood Pressure (Exercise) 130/50  Blood Pressure (Exit) 114/62       Heart Rate (Admit) 74 bpm       Heart Rate (Exercise) 97 bpm       Heart Rate (Exit) 96 bpm       Oxygen Saturation (Admit) 97 %        Oxygen Saturation (Exercise) 94 %       Oxygen Saturation (Exit) 95 %       Rating of Perceived Exertion (Exercise) 13       Perceived Dyspnea (Exercise) 2       Duration Continue with 30 min of aerobic exercise without signs/symptoms of physical distress.       Intensity THRR New         Progression   Progression Continue to progress workloads to maintain intensity without signs/symptoms of physical distress.         Resistance Training   Training Prescription Yes       Weight black bands       Reps 10-15       Time 10 Minutes         Interval Training   Interval Training Yes       Equipment Treadmill;Recumbant Elliptical       Comments Treadmill: 3.5 and 3.2 for 2 min, Recumbent elliptical: L8 and L6 for 2 min         Treadmill   MPH 3.5       Grade 0       Minutes 15       METs 3.68         Recumbant Elliptical   Level 8       Minutes 15       METs 4.8                Exercise Comments:   Exercise Comments     Row Name 06/14/23 1613           Exercise Comments Pt completed first day of exercise. Candace Frey exercised for 15 min on the treadmill and recumbent elliptical. She averaged 2.53 METs at 2.0 mph and 0 incline on the treadmill and 1.7 METs at level 1 on the recumbent elliptical. Candace Frey performed the warmup and cooldown standing without limitations. Discussed METs and how to increase METs.                Exercise Goals and Review:   Exercise Goals     Row Name 06/06/23 1055 06/17/23 1100 07/21/23 1609         Exercise Goals   Increase Physical Activity Yes Yes Yes     Intervention Provide advice, education, support and counseling about physical activity/exercise needs.;Develop an individualized exercise prescription for aerobic and resistive training based on initial evaluation findings, risk stratification, comorbidities and participant's personal goals. Provide advice, education, support and counseling about physical activity/exercise  needs.;Develop an individualized exercise prescription for aerobic and resistive training based on initial evaluation findings, risk stratification, comorbidities and participant's personal goals. Provide advice, education, support and counseling about physical activity/exercise needs.;Develop an individualized exercise prescription for aerobic and resistive training based on initial evaluation findings, risk stratification, comorbidities and participant's personal goals.     Expected Outcomes Long Term: Exercising regularly at least 3-5 days a week.;Short Term: Attend rehab on a regular basis to increase amount of physical activity.;Long Term: Add in home exercise to make exercise part of routine and to increase amount of physical activity. Long Term: Exercising regularly at  least 3-5 days a week.;Short Term: Attend rehab on a regular basis to increase amount of physical activity.;Long Term: Add in home exercise to make exercise part of routine and to increase amount of physical activity. Long Term: Exercising regularly at least 3-5 days a week.;Short Term: Attend rehab on a regular basis to increase amount of physical activity.;Long Term: Add in home exercise to make exercise part of routine and to increase amount of physical activity.     Increase Strength and Stamina Yes Yes Yes     Intervention Provide advice, education, support and counseling about physical activity/exercise needs.;Develop an individualized exercise prescription for aerobic and resistive training based on initial evaluation findings, risk stratification, comorbidities and participant's personal goals. Provide advice, education, support and counseling about physical activity/exercise needs.;Develop an individualized exercise prescription for aerobic and resistive training based on initial evaluation findings, risk stratification, comorbidities and participant's personal goals. Provide advice, education, support and counseling about physical  activity/exercise needs.;Develop an individualized exercise prescription for aerobic and resistive training based on initial evaluation findings, risk stratification, comorbidities and participant's personal goals.     Expected Outcomes Short Term: Increase workloads from initial exercise prescription for resistance, speed, and METs.;Short Term: Perform resistance training exercises routinely during rehab and add in resistance training at home;Long Term: Improve cardiorespiratory fitness, muscular endurance and strength as measured by increased METs and functional capacity ( ) Short Term: Increase workloads from initial exercise prescription for resistance, speed, and METs.;Short Term: Perform resistance training exercises routinely during rehab and add in resistance training at home;Long Term: Improve cardiorespiratory fitness, muscular endurance and strength as measured by increased METs and functional capacity ( ) Short Term: Increase workloads from initial exercise prescription for resistance, speed, and METs.;Short Term: Perform resistance training exercises routinely during rehab and add in resistance training at home;Long Term: Improve cardiorespiratory fitness, muscular endurance and strength as measured by increased METs and functional capacity ( )     Able to understand and use rate of perceived exertion (RPE) scale Yes Yes Yes     Intervention Provide education and explanation on how to use RPE scale Provide education and explanation on how to use RPE scale Provide education and explanation on how to use RPE scale     Expected Outcomes Short Term: Able to use RPE daily in rehab to express subjective intensity level;Long Term:  Able to use RPE to guide intensity level when exercising independently Short Term: Able to use RPE daily in rehab to express subjective intensity level;Long Term:  Able to use RPE to guide intensity level when exercising independently Short Term: Able to use RPE daily  in rehab to express subjective intensity level;Long Term:  Able to use RPE to guide intensity level when exercising independently     Able to understand and use Dyspnea scale Yes Yes Yes     Intervention Provide education and explanation on how to use Dyspnea scale Provide education and explanation on how to use Dyspnea scale Provide education and explanation on how to use Dyspnea scale     Expected Outcomes Short Term: Able to use Dyspnea scale daily in rehab to express subjective sense of shortness of breath during exertion;Long Term: Able to use Dyspnea scale to guide intensity level when exercising independently Short Term: Able to use Dyspnea scale daily in rehab to express subjective sense of shortness of breath during exertion;Long Term: Able to use Dyspnea scale to guide intensity level when exercising independently Short Term: Able to use Dyspnea scale daily in rehab  to express subjective sense of shortness of breath during exertion;Long Term: Able to use Dyspnea scale to guide intensity level when exercising independently     Knowledge and understanding of Target Heart Rate Range (THRR) Yes Yes Yes     Intervention Provide education and explanation of THRR including how the numbers were predicted and where they are located for reference Provide education and explanation of THRR including how the numbers were predicted and where they are located for reference Provide education and explanation of THRR including how the numbers were predicted and where they are located for reference     Expected Outcomes Short Term: Able to state/look up THRR;Short Term: Able to use daily as guideline for intensity in rehab;Long Term: Able to use THRR to govern intensity when exercising independently Short Term: Able to state/look up THRR;Short Term: Able to use daily as guideline for intensity in rehab;Long Term: Able to use THRR to govern intensity when exercising independently Short Term: Able to state/look up  THRR;Short Term: Able to use daily as guideline for intensity in rehab;Long Term: Able to use THRR to govern intensity when exercising independently     Understanding of Exercise Prescription Yes Yes Yes     Intervention Provide education, explanation, and written materials on patient's individual exercise prescription Provide education, explanation, and written materials on patient's individual exercise prescription Provide education, explanation, and written materials on patient's individual exercise prescription     Expected Outcomes Short Term: Able to explain program exercise prescription;Long Term: Able to explain home exercise prescription to exercise independently Short Term: Able to explain program exercise prescription;Long Term: Able to explain home exercise prescription to exercise independently Short Term: Able to explain program exercise prescription;Long Term: Able to explain home exercise prescription to exercise independently              Exercise Goals Re-Evaluation :  Exercise Goals Re-Evaluation     Row Name 06/17/23 1100 07/21/23 1609 08/18/23 0834         Exercise Goal Re-Evaluation   Exercise Goals Review Increase Physical Activity;Able to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

## 2023-08-25 ENCOUNTER — Encounter (HOSPITAL_COMMUNITY)
Admission: RE | Admit: 2023-08-25 | Discharge: 2023-08-25 | Disposition: A | Discharge status: Home / Self Care | Payer: Medicare Other | Source: Ambulatory Visit | Attending: Pulmonary Disease | Admitting: Pulmonary Disease

## 2023-08-25 VITALS — Wt 167.1 lb

## 2023-08-25 DIAGNOSIS — J449 Chronic obstructive pulmonary disease, unspecified: Secondary | ICD-10-CM

## 2023-08-25 NOTE — Progress Notes (Signed)
Daily Session Note  Patient Details  Name: Candace Frey MRN: [id number] Date of Birth: Jul 28, 1951 Referring Provider:   [name] Pulmonary Rehab Walk Test from 06/06/2023 in [facility] for Heart, Vascular, & Lung Health  Referring Provider Icard       Encounter Date: 08/25/2023  Check In:  Session Check In - 08/25/23 1140       Check-In   Supervising physician immediately available to respond to emergencies [facility] MD immediately available    Physician(s) Joni Reining, NP    Location [facility]    Staff Present Elissa Lovett BS, ACSM-CEP, Exercise Physiologist;Kaylee Earlene Plater, MS, ACSM-CEP, Exercise Physiologist;Jadin Creque Synthia Innocent, RN, BSN    Virtual Visit No    Medication changes reported     No    Fall or balance concerns reported    No    Tobacco Cessation No Change    Warm-up and Cool-down Performed as group-led instruction    Resistance Training Performed Yes    VAD Patient? No    PAD/SET Patient? No      Pain Assessment   Currently in Pain? No/denies    Multiple Pain Sites No             Capillary Blood Glucose: No results found for this or any previous visit (from the past 24 hour(s)).    Social History   Tobacco Use  Smoking Status Former   Current packs/day: 0.00   Average packs/day: 2.0 packs/day for 43.0 years (86.0 ttl pk-yrs)   Types: Cigarettes   Start date: 11/23/1963   Quit date: 11/22/2006   Years since quitting: 16.7  Smokeless Tobacco Never    Goals Met:  Proper associated with RPD/PD & O2 Sat Independence with exercise equipment Exercise tolerated well No report of concerns or symptoms today Strength training completed today  Goals Unmet:  Not Applicable  Comments: Service time is from 1011 to 1158.    Dr. Mechele Collin is Medical Director for Pulmonary Rehab at [facility].

## 2023-08-30 ENCOUNTER — Encounter (HOSPITAL_COMMUNITY): Payer: Medicare Other

## 2023-08-31 ENCOUNTER — Other Ambulatory Visit: Payer: Medicare Other

## 2023-09-01 ENCOUNTER — Encounter (HOSPITAL_COMMUNITY): Payer: Medicare Other

## 2023-09-06 ENCOUNTER — Encounter (HOSPITAL_COMMUNITY): Payer: Medicare Other

## 2023-09-08 ENCOUNTER — Encounter (HOSPITAL_COMMUNITY)
Admission: RE | Admit: 2023-09-08 | Discharge: 2023-09-08 | Disposition: A | Discharge status: Home / Self Care | Payer: Medicare Other | Source: Ambulatory Visit | Attending: Pulmonary Disease | Admitting: Pulmonary Disease

## 2023-09-08 VITALS — Wt 165.1 lb

## 2023-09-08 DIAGNOSIS — J449 Chronic obstructive pulmonary disease, unspecified: Secondary | ICD-10-CM | POA: Diagnosis not present

## 2023-09-08 NOTE — Progress Notes (Signed)
Daily Session Note  Patient Details  Name: Candace Frey MRN: [id number] Date of Birth: July 19, 1951 Referring Provider:   [name] Pulmonary Rehab Walk Test from 06/06/2023 in [facility]  Referring Provider Icard       Encounter Date: 09/08/2023  Check In:  Session Check In - 09/08/23 4098       Check-In   Supervising physician immediately available to respond to emergencies [facility] MD immediately available    Physician(s) Jari Favre, NP    Location [facility]    Staff Present Elissa Lovett BS, ACSM-CEP, Exercise Physiologist;Rodney Yera Earlene Plater, MS, ACSM-CEP, Exercise Physiologist;Casey Synthia Innocent, RN, BSN;Samantha Belarus, RD, LDN    Virtual Visit No    Medication changes reported     No    Fall or balance concerns reported    No    Tobacco Cessation No Change    Warm-up and Cool-down Performed as group-led instruction    Resistance Training Performed Yes    VAD Patient? No    PAD/SET Patient? No      Pain Assessment   Currently in Pain? No/denies    Multiple Pain Sites No             Capillary Blood Glucose: No results found for this or any previous visit (from the past 24 hour(s)).    Social History   Tobacco Use  Smoking Status Former   Current packs/day: 0.00   Average packs/day: 2.0 packs/day for 43.0 years (86.0 ttl pk-yrs)   Types: Cigarettes   Start date: 11/23/1963   Quit date: 11/22/2006   Years since quitting: 16.8  Smokeless Tobacco Never    Goals Met:  Proper associated with RPD/PD & O2 Sat Independence with exercise equipment Exercise tolerated well No report of concerns or symptoms today Strength training completed today  Goals Unmet:  Not Applicable  Comments: Service time is from 0808 to 0928.

## 2023-09-13 ENCOUNTER — Encounter (HOSPITAL_COMMUNITY): Payer: Self-pay | Admitting: *Deleted

## 2023-09-13 ENCOUNTER — Encounter (HOSPITAL_COMMUNITY): Payer: Medicare Other

## 2023-09-13 DIAGNOSIS — R1031 Right lower quadrant pain: Secondary | ICD-10-CM | POA: Diagnosis not present

## 2023-09-13 NOTE — Progress Notes (Signed)
Candace Frey arrived today for pulmonary rehab and was concerned whether or not she should exercise today. [name] has a pain to her right groin area and is unsure of what may be the cause.  Admits to pushing herself on Sunday when she was walking the neighbors dog.  Spent all day yesterday on the couch because of the discomfort.  This is very unlike her as she likes to be very active. Took Tylenol with some relief and she also applied ice.  The right groin is slightly swollen in comparison to the left.  No visible bruising, bulging or hematoma.  Tender to touch. Advised not to exercise today and to get in contact with her PCP for evaluation and follow up.  Alvina verbalized understanding and will contact her PCP.  Appreciative of the advice. Pulmonary rehab staff made aware. Alanson Aly, BSN Cardiac and Emergency planning/management officer

## 2023-09-14 ENCOUNTER — Ambulatory Visit: Payer: Medicare Other | Admitting: Family Medicine

## 2023-09-15 ENCOUNTER — Telehealth (HOSPITAL_COMMUNITY): Payer: Self-pay

## 2023-09-15 NOTE — Telephone Encounter (Signed)
Candace Frey is hoping to be back on Tuesday next week. After she left early Tuesday 10/22 she has been in contact with her Dr. She has a doctor appt this afternoon to see if it is a pulled muscle or a hernia.

## 2023-09-19 DIAGNOSIS — L738 Other specified follicular disorders: Secondary | ICD-10-CM | POA: Diagnosis not present

## 2023-09-19 DIAGNOSIS — L821 Other seborrheic keratosis: Secondary | ICD-10-CM | POA: Diagnosis not present

## 2023-09-19 DIAGNOSIS — L80 Vitiligo: Secondary | ICD-10-CM | POA: Diagnosis not present

## 2023-09-19 DIAGNOSIS — D2272 Melanocytic nevi of left lower limb, including hip: Secondary | ICD-10-CM | POA: Diagnosis not present

## 2023-09-19 DIAGNOSIS — D2271 Melanocytic nevi of right lower limb, including hip: Secondary | ICD-10-CM | POA: Diagnosis not present

## 2023-09-20 ENCOUNTER — Encounter (HOSPITAL_COMMUNITY): Payer: Medicare Other

## 2023-09-21 NOTE — Progress Notes (Signed)
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 , Disp: , Rfl:    Estradiol 0.75 MG/1.25 GM (0.06%) topical gel, Place 1.25 g onto the skin daily., Disp: , Rfl:    levothyroxine (SYNTHROID) 88 MCG tablet, Take 1 tablet (88 mcg total) by mouth daily., Disp: 90 tablet, Rfl: 3   Multiple Vitamins-Minerals (PRESERVISION AREDS 2 PO), Take 1 tablet by mouth 2 (two) times daily., Disp: , Rfl:   Past Medical History: Past Medical History:  Diagnosis Date   Acute deep vein thrombosis (DVT) of distal vein of right lower extremity (HCC) 07/22/2016   Acute medial meniscal tear 01/29/2015   Chicken pox    Chronic obstructive lung disease (HCC) 05/23/2019   Colon polyps    Complication of anesthesia    slow to wake up    COPD (chronic obstructive pulmonary disease) (HCC)    Genital warts    Multiple pulmonary nodules determined by computed tomography of lung 07/05/2017   CT 03/03/17  Right lower lobe nodularity described on the prior exam is less  apparent today, favored to represent an area of scarring. other tiny nodules described on the prior are not readily apparent. No enlarging or dominant nodule identified > rec continue low dose annual  screening until 2023 per PCP (pt has fm hx lung ca)    Osteopenia    Shortness of breath dyspnea    with exertion was a smoker for many year per patient    Thyroid disease    UTI (urinary tract infection)     Tobacco Use: Social History   Tobacco Use  Smoking Status Former   Current packs/day: 0.00   Average packs/day: 2.0 packs/day for 43.0 years (86.0 ttl pk-yrs)   Types: Cigarettes   Start date: 11/23/1963   Quit date: 11/22/2006   Years since quitting: 16.8  Smokeless Tobacco Never    Labs: Review Flowsheet       Latest Ref Rng & Units 07/14/2020 12/25/2021 01/11/2023 01/17/2023  Labs for ITP Cardiac and Pulmonary Rehab  Cholestrol 0 - 200 mg/dL 086  578  469  -  LDL (calc) 0 - 99 mg/dL 75  83  78  -  HDL-C >62.95 mg/dL 81  28.41  32.44  -  Trlycerides 0.0 - 149.0 mg/dL 75  010.2  72.5  -  Hemoglobin A1c 4.6 - 6.5 % - - - 5.6     Details            Capillary Blood Glucose: No results found for: "GLUCAP"  Pulmonary Assessment Scores:  Pulmonary Assessment Scores     Row Name 06/06/23 1115         ADL UCSD   SOB Score total 28       CAT Score   CAT Score 8       mMRC Score   mMRC Score 2             UCSD: Self-administered rating of dyspnea associated with activities of daily living (ADLs) 6-point scale (0 = "not at all" to 5 = "maximal or unable to do because of breathlessness")  Scoring Scores range from 0 to 120.  Minimally important difference is 5 units  CAT: CAT can identify the health impairment of COPD patients and is better correlated with disease progression.  CAT has a scoring range of zero to 40. The CAT score is classified into four groups of low (less than 10), medium (10 - 20), high (21-30) and very high (31-40) based on the impact level  of disease on health status. A CAT score over 10 suggests significant symptoms.  A worsening CAT score could be explained by an exacerbation, poor medication adherence, poor inhaler technique, or progression of COPD or comorbid conditions.  CAT MCID is 2 points  mMRC: mMRC (Modified Medical Research Council) Dyspnea Scale is used to assess the degree of baseline functional disability in patients of respiratory disease due to dyspnea. No minimal important difference is established. A decrease in score of 1 point or greater is considered a positive change.   Pulmonary Function Assessment:  Pulmonary Function Assessment - 06/06/23 1100       Breath   Bilateral Breath Sounds Decreased    Shortness of Breath Yes;Fear of Shortness of Breath;Limiting activity             Exercise Target Goals: Exercise Program Goal: Individual exercise prescription set using results from initial 6 min walk test and THRR while considering  patient's activity barriers and safety.   Exercise Prescription Goal: Initial exercise prescription builds to 30-45 minutes a day of aerobic activity, 2-3 days per week.  Home exercise guidelines will be given to patient during program as part of exercise prescription that the participant will acknowledge.  Activity Barriers & Risk Stratification:  Activity Barriers & Cardiac Risk Stratification - 06/06/23 1054       Activity Barriers & Cardiac Risk Stratification   Activity Barriers Other (comment);Right Knee Replacement;Muscular Weakness;Deconditioning;Shortness of Breath    Comments Right hip pain             6 Minute Walk:  6 Minute Walk     Row Name 06/06/23 1212         6 Minute Walk   Phase Initial     Distance 1290 feet     Walk Time 6 minutes     # of Rest Breaks 0     MPH 2.44     METS 3.11     RPE 13     Perceived Dyspnea  2.5     VO2 Peak 10.87     Symptoms No     Resting HR 73 bpm     Resting BP 112/64     Resting Oxygen Saturation   98 %     Exercise Oxygen Saturation  during 6 min walk 93 %     Max Ex. HR 121 bpm     Max Ex. BP 142/68     2 Minute Post BP 120/68  Interval HR   1 Minute HR 101     2 Minute HR 111     3 Minute HR 111     4 Minute HR 121     5 Minute HR 118     6 Minute HR 117     2 Minute Post HR 84     Interval Heart Rate? Yes       Interval Oxygen   Interval Oxygen? Yes     Baseline Oxygen Saturation % 98 %     1 Minute Oxygen Saturation % 97 %     1 Minute Liters of Oxygen 0 L     2 Minute Oxygen Saturation % 96 %     2 Minute Liters of Oxygen 0 L     3 Minute Oxygen Saturation % 95 %     3 Minute Liters of Oxygen 0 L     4 Minute Oxygen Saturation % 93 %     4 Minute Liters of Oxygen 0 L     5 Minute Oxygen Saturation % 94 %     5 Minute Liters of Oxygen 0 L     6 Minute Oxygen Saturation % 97 %     6 Minute Liters of Oxygen 0 L     2 Minute Post Oxygen Saturation % 99 %     2 Minute Post Liters of Oxygen 0 L              Oxygen Initial Assessment:  Oxygen Initial Assessment - 06/06/23 1059       Home Oxygen   Home Oxygen Device None    Sleep Oxygen Prescription None    Home Exercise Oxygen Prescription None    Home Resting Oxygen Prescription None      Initial 6 min Walk   Oxygen Used None      Program Oxygen Prescription   Program Oxygen Prescription None      Intervention   Short Term Goals To learn and understand importance of maintaining oxygen saturations>88%;To learn and understand importance of monitoring SPO2 with pulse oximeter and demonstrate accurate use of the pulse oximeter.;To learn and demonstrate proper pursed lip breathing techniques or other breathing techniques. ;To learn and demonstrate proper use of respiratory medications    Long  Term Goals Maintenance of O2 saturations>88%;Verbalizes importance of monitoring SPO2 with pulse oximeter and return demonstration;Exhibits proper breathing techniques, such as pursed lip breathing or other  method taught during program session             Oxygen Re-Evaluation:  Oxygen Re-Evaluation     Row Name 06/17/23 1102 07/21/23 1611 08/18/23 0837 09/15/23 1631       Program Oxygen Prescription   Program Oxygen Prescription None None None None      Home Oxygen   Home Oxygen Device None None None None    Sleep Oxygen Prescription None None None None    Home Exercise Oxygen Prescription None None None None    Home Resting Oxygen Prescription None None None None      Goals/Expected Outcomes   Short Term Goals To learn and understand importance of maintaining oxygen saturations>88%;To learn and understand importance of monitoring SPO2 with pulse oximeter and demonstrate accurate use of the pulse oximeter.;To learn and demonstrate proper pursed lip breathing techniques or other breathing techniques. ;To learn and demonstrate proper use of respiratory medications To learn and understand importance of maintaining oxygen saturations>88%;To learn and understand importance of monitoring  SPO2 with pulse oximeter and demonstrate accurate use of the pulse oximeter.;To learn and demonstrate proper pursed lip breathing techniques or other breathing techniques. ;To learn and demonstrate proper use of respiratory medications To learn and understand importance of maintaining oxygen saturations>88%;To learn and understand importance of monitoring SPO2 with pulse oximeter and demonstrate accurate use of the pulse oximeter.;To learn and demonstrate proper pursed lip breathing techniques or other breathing techniques. ;To learn and demonstrate proper use of respiratory medications To learn and understand importance of maintaining oxygen saturations>88%;To learn and understand importance of monitoring SPO2 with pulse oximeter and demonstrate accurate use of the pulse oximeter.;To learn and demonstrate proper pursed lip breathing techniques or other breathing techniques. ;To learn and demonstrate proper use of  respiratory medications    Long  Term Goals Maintenance of O2 saturations>88%;Verbalizes importance of monitoring SPO2 with pulse oximeter and return demonstration;Exhibits proper breathing techniques, such as pursed lip breathing or other method taught during program session Maintenance of O2 saturations>88%;Verbalizes importance of monitoring SPO2 with pulse oximeter and return demonstration;Exhibits proper breathing techniques, such as pursed lip breathing or other method taught during program session Maintenance of O2 saturations>88%;Verbalizes importance of monitoring SPO2 with pulse oximeter and return demonstration;Exhibits proper breathing techniques, such as pursed lip breathing or other method taught during program session Maintenance of O2 saturations>88%;Verbalizes importance of monitoring SPO2 with pulse oximeter and return demonstration;Exhibits proper breathing techniques, such as pursed lip breathing or other method taught during program session    Goals/Expected Outcomes Compliance and understanding of oxygen saturation monitoring and breathing techniques to decrease shortness of breath. Compliance and understanding of oxygen saturation monitoring and breathing techniques to decrease shortness of breath. Compliance and understanding of oxygen saturation monitoring and breathing techniques to decrease shortness of breath. Compliance and understanding of oxygen saturation monitoring and breathing techniques to decrease shortness of breath.             Oxygen Discharge (Final Oxygen Re-Evaluation):  Oxygen Re-Evaluation - 09/15/23 1631       Program Oxygen Prescription   Program Oxygen Prescription None      Home Oxygen   Home Oxygen Device None    Sleep Oxygen Prescription None    Home Exercise Oxygen Prescription None    Home Resting Oxygen Prescription None      Goals/Expected Outcomes   Short Term Goals To learn and understand importance of maintaining oxygen  saturations>88%;To learn and understand importance of monitoring SPO2 with pulse oximeter and demonstrate accurate use of the pulse oximeter.;To learn and demonstrate proper pursed lip breathing techniques or other breathing techniques. ;To learn and demonstrate proper use of respiratory medications    Long  Term Goals Maintenance of O2 saturations>88%;Verbalizes importance of monitoring SPO2 with pulse oximeter and return demonstration;Exhibits proper breathing techniques, such as pursed lip breathing or other method taught during program session    Goals/Expected Outcomes Compliance and understanding of oxygen saturation monitoring and breathing techniques to decrease shortness of breath.             Initial Exercise Prescription:  Initial Exercise Prescription - 06/06/23 1200       Date of Initial Exercise RX and Referring Provider   Date 06/06/23    Referring Provider Icard    Expected Discharge Date 09/01/23      Treadmill   MPH 2    Grade 0    Minutes 15      Recumbant Elliptical   Level 1    Minutes 15  METs 2      Prescription Details   Frequency (times per week) 2    Duration Progress to 30 minutes of continuous aerobic without signs/symptoms of physical distress      Intensity   THRR 40-80% of Max Heartrate 58-117    Ratings of Perceived Exertion 11-13    Perceived Dyspnea 0-4      Progression   Progression Continue to progress workloads to maintain intensity without signs/symptoms of physical distress.      Resistance Training   Training Prescription Yes    Weight blue bands    Reps 10-15             Perform Capillary Blood Glucose checks as needed.  Exercise Prescription Changes:   Exercise Prescription Changes     Row Name 06/14/23 1200 06/28/23 1200 07/07/23 1213 07/26/23 1100 08/02/23 1155     Response to Exercise   Blood Pressure (Admit) 114/60 100/58 102/58 114/62 100/70   Blood Pressure (Exercise) 124/66 132/68 -- 138/62 --   Blood  Pressure (Exit) 122/70 96/58 94/54  68/60 120/64   Heart Rate (Admit) 71 bpm 76 bpm 75 bpm 71 bpm 70 bpm   Heart Rate (Exercise) 93 bpm 92 bpm 90 bpm 127 bpm 123 bpm   Heart Rate (Exit) 79 bpm 86 bpm 88 bpm 80 bpm 81 bpm   Oxygen Saturation (Admit) 98 % 97 % 97 % 97 % 98 %   Oxygen Saturation (Exercise) 96 % 95 % 97 % 94 % 95 %   Oxygen Saturation (Exit) 96 % 95 % 95 % 97 % 95 %   Rating of Perceived Exertion (Exercise) 11 9 11 11 13    Perceived Dyspnea (Exercise) 1 0 1 1 2    Duration Progress to 30 minutes of  aerobic without signs/symptoms of physical distress Continue with 30 min of aerobic exercise without signs/symptoms of physical distress. Continue with 30 min of aerobic exercise without signs/symptoms of physical distress. Continue with 30 min of aerobic exercise without signs/symptoms of physical distress. Continue with 30 min of aerobic exercise without signs/symptoms of physical distress.   Intensity THRR unchanged THRR unchanged THRR unchanged THRR unchanged THRR unchanged     Progression   Progression Continue to progress workloads to maintain intensity without signs/symptoms of physical distress. Continue to progress workloads to maintain intensity without signs/symptoms of physical distress. Continue to progress workloads to maintain intensity without signs/symptoms of physical distress. Continue to progress workloads to maintain intensity without signs/symptoms of physical distress. Continue to progress workloads to maintain intensity without signs/symptoms of physical distress.   Average METs -- -- -- -- 4.5     Resistance Training   Training Prescription Yes Yes Yes Yes Yes   Weight blue bands blue bands blue bands blue bands black bands   Reps 10-15 10-15 10-15 10-15 10-15   Time 10 Minutes 10 Minutes 10 Minutes 10 Minutes 10 Minutes     Interval Training   Interval Training -- -- -- Yes Yes   Equipment -- -- -- Treadmill;Recumbant Elliptical Treadmill;Recumbant Elliptical    Comments -- -- -- Treadmill: 3 @ 2.5% for 1 min, 3.5 @ 3% for 2 min. Octane level 8 for 2 min, level 4 for 1 min Treadmill: 3.2 @ 4% for 1 min, 3.0 @ 3% for 2 min. Octane level 8 for 2 min, level 5 for 1 min     Treadmill   MPH 2 2.8 3 3.5 3.2   Grade 0 3 4 3  4   Minutes 15 15 15 15 15    METs 2.53 4.3 4.95 5.13 5.2     Recumbant Elliptical   Level 1 2 3 5 5    Minutes 15 15 15 15 15    METs 2.3 3.4 4.2 3.7 4.5    Row Name 08/23/23 1200 08/25/23 1004 09/08/23 0930         Response to Exercise   Blood Pressure (Admit) 116/54 96/60 118/68     Blood Pressure (Exercise) 130/50 -- --     Blood Pressure (Exit) 114/62 98/52 100/54     Heart Rate (Admit) 74 bpm 72 bpm 95 bpm     Heart Rate (Exercise) 97 bpm 117 bpm 110 bpm     Heart Rate (Exit) 96 bpm 86 bpm 80 bpm     Oxygen Saturation (Admit) 97 % 97 % 97 %     Oxygen Saturation (Exercise) 94 % 94 % 95 %     Oxygen Saturation (Exit) 95 % 95 % 96 %     Rating of Perceived Exertion (Exercise) 13 13 11      Perceived Dyspnea (Exercise) 2 2 2      Duration Continue with 30 min of aerobic exercise without signs/symptoms of physical distress. Continue with 30 min of aerobic exercise without signs/symptoms of physical distress. Continue with 30 min of aerobic exercise without signs/symptoms of physical distress.     Intensity THRR New THRR New THRR unchanged       Progression   Progression Continue to progress workloads to maintain intensity without signs/symptoms of physical distress. Continue to progress workloads to maintain intensity without signs/symptoms of physical distress. Continue to progress workloads to maintain intensity without signs/symptoms of physical distress.       Resistance Training   Training Prescription Yes Yes Yes     Weight black bands black bands black bands     Reps 10-15 10-15 10-15     Time 10 Minutes 10 Minutes 10 Minutes       Interval Training   Interval Training Yes -- --     Equipment Treadmill;Recumbant  Elliptical -- --     Comments Treadmill: 3.5 and 3.2 for 2 min, Recumbent elliptical: L8 and L6 for 2 min -- --       Treadmill   MPH 3.5 3.2 3     Grade 0 3 3     Minutes 15 15 15      METs 3.68 4.77 4.4       Recumbant Elliptical   Level 8 8 6      Minutes 15 15 15      METs 4.8 4.4 4.4              Exercise Comments:   Exercise Comments     Row Name 06/14/23 1613           Exercise Comments Pt completed first day of exercise. Candace Frey exercised for 15 min on the treadmill and recumbent elliptical. She averaged 2.53 METs at 2.0 mph and 0 incline on the treadmill and 1.7 METs at level 1 on the recumbent elliptical. Candace Frey performed the warmup and cooldown standing without limitations. Discussed METs and how to increase METs.                Exercise Goals and Review:   Exercise Goals     Row Name 06/06/23 1055 06/17/23 1100 07/21/23 1609         Exercise Goals   Increase Physical Activity Yes  Yes Yes     Intervention Provide advice, education, support and counseling about physical activity/exercise needs.;Develop an individualized exercise prescription for aerobic and resistive training based on initial evaluation findings, risk stratification, comorbidities and participant's personal goals. Provide advice, education, support and counseling about physical activity/exercise needs.;Develop an individualized exercise prescription for aerobic and resistive training based on initial evaluation findings, risk stratification, comorbidities and participant's personal goals. Provide advice, education, support and counseling about physical activity/exercise needs.;Develop an individualized exercise prescription for aerobic and resistive training based on initial evaluation findings, risk stratification, comorbidities and participant's personal goals.     Expected Outcomes Long Term: Exercising regularly at least 3-5 days a week.;Short Term: Attend rehab on a regular basis to increase  amount of physical activity.;Long Term: Add in home exercise to make exercise part of routine and to increase amount of physical activity. Long Term: Exercising regularly at least 3-5 days a week.;Short Term: Attend rehab on a regular basis to increase amount of physical activity.;Long Term: Add in home exercise to make exercise part of routine and to increase amount of physical activity. Long Term: Exercising regularly at least 3-5 days a week.;Short Term: Attend rehab on a regular basis to increase amount of physical activity.;Long Term: Add in home exercise to make exercise part of routine and to increase amount of physical activity.     Increase Strength and Stamina Yes Yes Yes     Intervention Provide advice, education, support and counseling about physical activity/exercise needs.;Develop an individualized exercise prescription for aerobic and resistive training based on initial evaluation findings, risk stratification, comorbidities and participant's personal goals. Provide advice, education, support and counseling about physical activity/exercise needs.;Develop an individualized exercise prescription for aerobic and resistive training based on initial evaluation findings, risk stratification, comorbidities and participant's personal goals. Provide advice, education, support and counseling about physical activity/exercise needs.;Develop an individualized exercise prescription for aerobic and resistive training based on initial evaluation findings, risk stratification, comorbidities and participant's personal goals.     Expected Outcomes Short Term: Increase workloads from initial exercise prescription for resistance, speed, and METs.;Short Term: Perform resistance training exercises routinely during rehab and add in resistance training at home;Long Term: Improve cardiorespiratory fitness, muscular endurance and strength as measured by increased METs and functional capacity ( ) Short Term: Increase  workloads from initial exercise prescription for resistance, speed, and METs.;Short Term: Perform resistance training exercises routinely during rehab and add in resistance training at home;Long Term: Improve cardiorespiratory fitness, muscular endurance and strength as measured by increased METs and functional capacity ( ) Short Term: Increase workloads from initial exercise prescription for resistance, speed, and METs.;Short Term: Perform resistance training exercises routinely during rehab and add in resistance training at home;Long Term: Improve cardiorespiratory fitness, muscular endurance and strength as measured by increased METs and functional capacity ( )     Able to understand and use rate of perceived exertion (RPE) scale Yes Yes Yes     Intervention Provide education and explanation on how to use RPE scale Provide education and explanation on how to use RPE scale Provide education and explanation on how to use RPE scale     Expected Outcomes Short Term: Able to use RPE daily in rehab to express subjective intensity level;Long Term:  Able to use RPE to guide intensity level when exercising independently Short Term: Able to use RPE daily in rehab to express subjective intensity level;Long Term:  Able to use RPE to guide intensity level when exercising independently Short Term: Able to use RPE  daily in rehab to express subjective intensity level;Long Term:  Able to use RPE to guide intensity level when exercising independently     Able to understand and use Dyspnea scale Yes Yes Yes     Intervention Provide education and explanation on how to use Dyspnea scale Provide education and explanation on how to use Dyspnea scale Provide education and explanation on how to use Dyspnea scale     Expected Outcomes Short Term: Able to use Dyspnea scale daily in rehab to express subjective sense of shortness of breath during exertion;Long Term: Able to use Dyspnea scale to guide intensity level when  exercising independently Short Term: Able to use Dyspnea scale daily in rehab to express subjective sense of shortness of breath during exertion;Long Term: Able to use Dyspnea scale to guide intensity level when exercising independently Short Term: Able to use Dyspnea scale daily in rehab to express subjective sense of shortness of breath during exertion;Long Term: Able to use Dyspnea scale to guide intensity level when exercising independently     Knowledge and understanding of Target Heart Rate Range (THRR) Yes Yes Yes     Intervention Provide education and explanation of THRR including how the numbers were predicted and where they are located for reference Provide education and explanation of THRR including how the numbers were predicted and where they are located for reference Provide education and explanation of THRR including how the numbers were predicted and where they are located for reference     Expected Outcomes Short Term: Able to state/look up THRR;Short Term: Able to use daily as guideline for intensity in rehab;Long Term: Able to use THRR to govern intensity when exercising independently Short Term: Able to state/look up THRR;Short Term: Able to use daily as guideline for intensity in rehab;Long Term: Able to use THRR to govern intensity when exercising independently Short Term: Able to state/look up THRR;Short Term: Able to use daily as guideline for intensity in rehab;Long Term: Able to use THRR to govern intensity when exercising independently     Understanding of Exercise Prescription Yes Yes Yes     Intervention Provide education, explanation, and written materials on patient's individual exercise prescription Provide education, explanation, and written materials on patient's individual exercise prescription Provide education, explanation, and written materials on patient's individual exercise prescription     Expected Outcomes Short Term: Able to explain program exercise prescription;Long  Term: Able to explain home exercise prescription to exercise independently Short Term: Able to explain program exercise prescription;Long Term: Able to explain home exercise prescription to exercise independently Short Term: Able to explain program exercise prescription;Long Term: Able to explain home exercise prescription to exercise independently              Exercise Goals Re-Evaluation :  Exercise Goals Re-Evaluation     Row Name 06/17/23 1100 07/21/23 1609 08/18/23 0834 09/15/23 1628       Exercise Goal Re-Evaluation   Exercise Goals Review Increase Physical Activity;Able to understand and use Dyspnea scale;Understanding of Exercise Prescription;Increase Strength and Stamina;Knowledge and understanding of Target Heart Rate Range (THRR);Able to understand and use rate of perceived exertion (RPE) scale Increase Physical Activity;Able to understand and use Dyspnea scale;Understanding of Exercise Prescription;Increase Strength and Stamina;Knowledge and understanding of Target Heart Rate Range (THRR);Able to understand and use rate of perceived exertion (RPE) scale Increase Physical Activity;Able to understand and use Dyspnea scale;Understanding of Exercise Prescription;Increase Strength and Stamina;Knowledge and understanding of Target Heart Rate Range (THRR);Able to understand and use rate  of perceived exertion (RPE) scale Increase Physical Activity;Able to understand and use Dyspnea scale;Understanding of Exercise Prescription;Increase Strength and Stamina;Knowledge and understanding of Target Heart Rate Range (THRR);Able to understand and use rate of perceived exertion (RPE) scale    Comments Candace Frey has completed 2 exercise sessions. She exercises for 15 min on the treadmill and recumbent elliptical. Candace Frey averages 2.53 METs at 2.0 mph and 0 incline on the treadmill and 1.7 METs at level 1 on the recumbent elliptical. She performs the warmup and cooldown standing without limitations. It is too soon to  note any discernable progressions. Will continue to monitor and progress as able. Candace Frey has completed 9 exercise sessions. She exercises for 15 min on the treadmill and recumbent elliptical. Candace Frey averages 3.296 METs at 2.5 mph and 1 incline on the treadmill and 4.6 METs at level 3 on the recumbent elliptical. She performs the warmup and cooldown standing without limitations. Candace Frey has increased her workload for both exercise modes as she tolerates progressions well. She is very motivated to exercise and is trying to understand her limits. Candace Frey feels she has benefitted thus far from PR. Will continue to monitor and progress as able. Candace Frey has completed 13 exercise sessions. She exercises for 15 min on the treadmill and recumbent elliptical. Candace Frey averages 3.71 METs at 3.0 mph and 1% incline on the treadmill and 4.7 METs at level 6 on the recumbent elliptical. She performs the warmup and cooldown standing without limitations. Candace Frey is still increasing her workloads for both exercise modes. I am unsure what her exact METs as she has progressed to interval training. Candace Frey tolerates interval training well and understands her limits. Candace Frey remains motivated to exercise. Will discuss home exercise soon. Will continue to monitor and progress as able. Candace Frey has completed 17 exercise sessions. She exercises for 15 min on the treadmill and recumbent elliptical. Candace Frey averages 4.4 METs at 3.0 mph and 3% incline on the treadmill and 4.4 METs at level 6 on the recumbent elliptical. She performs the warmup and cooldown standing without limitations. Candace Frey has somewhat increased her workload for the treadmill as METs have also increased. She is also doing interval training on the recumbent elliptical. She has a better understanding of setting up her exercise equipment. She was unable to do interval training last exercise session due to fatigue, and is currently be evaluated for a strained muscle or hernia. Will continue to monitor and progress as able.     Expected Outcomes Through exercise at rehab and home, the patient will decrease shortness of breath with daily activities and feel confident in carrying out an exercise regimen at home. Through exercise at rehab and home, the patient will decrease shortness of breath with daily activities and feel confident in carrying out an exercise regimen at home. Through exercise at rehab and home, the patient will decrease shortness of breath with daily activities and feel confident in carrying out an exercise regimen at home. Through exercise at rehab and home, the patient will decrease shortness of breath with daily activities and feel confident in carrying out an exercise regimen at home.             Discharge Exercise Prescription (Final Exercise Prescription Changes):  Exercise Prescription Changes - 09/08/23 0930       Response to Exercise   Blood Pressure (Admit) 118/68    Blood Pressure (Exit) 100/54    Heart Rate (Admit) 95 bpm    Heart Rate (Exercise) 110 bpm    Heart  Rate (Exit) 80 bpm    Oxygen Saturation (Admit) 97 %    Oxygen Saturation (Exercise) 95 %    Oxygen Saturation (Exit) 96 %    Rating of Perceived Exertion (Exercise) 11    Perceived Dyspnea (Exercise) 2    Duration Continue with 30 min of aerobic exercise without signs/symptoms of physical distress.    Intensity THRR unchanged      Progression   Progression Continue to progress workloads to maintain intensity without signs/symptoms of physical distress.      Resistance Training   Training Prescription Yes    Weight black bands    Reps 10-15    Time 10 Minutes      Treadmill   MPH 3    Grade 3    Minutes 15    METs 4.4      Recumbant Elliptical   Level 6    Minutes 15    METs 4.4             Nutrition:  Target Goals: Understanding of nutrition guidelines, daily intake of sodium 1500mg , cholesterol 200mg , calories 30% from fat and 7% or less from saturated fats, daily to have 5 or more servings of  fruits and vegetables.  Biometrics:  Pre Biometrics - 06/06/23 1041       Pre Biometrics   Grip Strength 22 kg              Nutrition Therapy Plan and Nutrition Goals:  Nutrition Therapy & Goals - 09/15/23 1135       Nutrition Therapy   Diet General Healthy Diet      Personal Nutrition Goals   Nutrition Goal Patient to improve diet quality by using the plate method as a guide for meal planning to include lean protein/plant protein, fruits, vegetables, whole grains, nonfat dairy as part of a well-balanced diet.   goal in action.   Comments Goals in action. Candace Frey reports no nutrition concerns at this time. Candace Frey is down ~9.7# since starting with our program; most recent TSH was low and medication was adjusted per documentation 07/18/23. BMI remains appropriate. We have discussed protein needs/ protein intake and supplements. Candace Frey will continue to benefit from participation in pulmonary rehab for nutrition and exercise support.      Intervention Plan   Intervention Prescribe, educate and counsel regarding individualized specific dietary modifications aiming towards targeted core components such as weight, hypertension, lipid management, diabetes, heart failure and other comorbidities.;Nutrition handout(s) given to patient.    Expected Outcomes Short Term Goal: Understand basic principles of dietary content, such as calories, fat, sodium, cholesterol and nutrients.;Long Term Goal: Adherence to prescribed nutrition plan.             Nutrition Assessments:  MEDIFICTS Score Key: >=70 Need to make dietary changes  40-70 Heart Healthy Diet <= 40 Therapeutic Level Cholesterol Diet   Picture Your Plate Scores: <70 Unhealthy dietary pattern with much room for improvement. 41-50 Dietary pattern unlikely to meet recommendations for good health and room for improvement. 51-60 More healthful dietary pattern, with some room for improvement.  >60 Healthy dietary pattern, although there may  be some specific behaviors that could be improved.    Nutrition Goals Re-Evaluation:  Nutrition Goals Re-Evaluation     Row Name 06/14/23 1149 07/19/23 1546 08/16/23 1143 09/15/23 1135       Goals   Current Weight 172 lb 13.5 oz (78.4 kg) 169 lb 15.6 oz (77.1 kg) 167 lb 1.7 oz (75.8 kg)  165 lb 2 oz (74.9 kg)    Comment A1c WNL, lipids WNL A1c WNL, lipids WNL A1c WNL, lipids WNL A1c WNL, lipids WNL    Expected Outcome Candace Frey reports no nutrition concerns at this time. Candace Frey will continue to benefit from participation in pulmonary rehab for nutrition and exercise support. Candace Frey reports no nutrition concerns at this time. Candace Frey is down ~4.8# since starting with our program; most recent TSH was low and medication was adjusted per documentation 07/18/23. BMI remains appropriate. Candace Frey will continue to benefit from participation in pulmonary rehab for nutrition and exercise support. Candace Frey reports no nutrition concerns at this time. Candace Frey is down ~7.7# since starting with our program; most recent TSH was low and medication was adjusted per documentation 07/18/23. BMI remains appropriate. Candace Frey will continue to benefit from participation in pulmonary rehab for nutrition and exercise support. Goals in action. Candace Frey reports no nutrition concerns at this time. Candace Frey is down ~9.7# since starting with our program; most recent TSH was low and medication was adjusted per documentation 07/18/23. BMI remains appropriate. We have discussed protein needs/ protein intake and supplements. Candace Frey will continue to benefit from participation in pulmonary rehab for nutrition and exercise support.             Nutrition Goals Discharge (Final Nutrition Goals Re-Evaluation):  Nutrition Goals Re-Evaluation - 09/15/23 1135       Goals   Current Weight 165 lb 2 oz (74.9 kg)    Comment A1c WNL, lipids WNL    Expected Outcome Goals in action. Candace Frey reports no nutrition concerns at this time. Candace Frey is down ~9.7# since starting with our program; most  recent TSH was low and medication was adjusted per documentation 07/18/23. BMI remains appropriate. We have discussed protein needs/ protein intake and supplements. Candace Frey will continue to benefit from participation in pulmonary rehab for nutrition and exercise support.             Psychosocial: Target Goals: Acknowledge presence or absence of significant depression and/or stress, maximize coping skills, provide positive support system. Participant is able to verbalize types and ability to use techniques and skills needed for reducing stress and depression.  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 Yes    Comments Has good support from daughter      Barriers   Psychosocial barriers to participate in program The patient should benefit from training in stress management and relaxation.      Screening Interventions   Interventions Encouraged to exercise    Expected Outcomes Long Term Goal: Stressors or current issues are controlled or eliminated.;Long Term goal: The participant improves quality of Life and PHQ9 Scores as seen by post scores and/or verbalization of changes             Quality of Life Scores:  Scores of 19 and below usually indicate a poorer quality of life in these areas.  A difference of  2-3 points is a clinically meaningful difference.  A difference of 2-3 points in the total score of the Quality of Life Index has been associated with significant improvement in overall quality of life, self-image, physical symptoms, and general health in  studies assessing change in quality of life.  PHQ-9: Review Flowsheet  More data exists      07/18/2023 06/06/2023 01/11/2023 12/30/2022 12/25/2021  Depression screen PHQ 2/9   Decreased Interest 0 0 0 0 0  Down, Depressed, Hopeless 0 0 0 0 0  PHQ - 2 Score 0 0 0 0 0  Altered sleeping 0 0 0 - 0  Tired, decreased energy 1 1 0 - 0  Change in appetite 1 1 1  - 0  Feeling bad or failure about yourself  0 0 0 - 0  Trouble concentrating 0 0 0 - 0  Moving slowly or fidgety/restless 0 0 0 - 0  Suicidal thoughts 0 0 0 - 0  PHQ-9 Score 2 2 1  - 0  Difficult doing work/chores Not difficult at all Not difficult at all Not difficult at all - Not difficult at all    Details           Interpretation of Total Score  Total Score Depression Severity:  1-4 = Minimal depression, 5-9 = Mild depression, 10-14 = Moderate depression, 15-19 = Moderately severe depression, 20-27 = Severe depression   Psychosocial Evaluation and Intervention:  Psychosocial Evaluation - 06/06/23 1101       Psychosocial Evaluation & Interventions   Interventions Relaxation education;Encouraged to exercise with the program and follow exercise prescription    Comments Pt says she sometimes gets stress about taking care of her husband with dementia    Expected Outcomes For Candace Frey to decrease her stress and attend PR without any psychosocial barriers or concerns    Continue Psychosocial Services  No Follow up required             Psychosocial Re-Evaluation:  Psychosocial Re-Evaluation     Row Name 06/22/23 1541 07/18/23 1437 08/15/23 1525 09/12/23 1050       Psychosocial Re-Evaluation   Current issues with Current Stress Concerns Current Stress Concerns Current Stress Concerns Current Stress Concerns    Comments Candace Frey is still dealing with the stress that comes with being her husbands caregiver. She has started exercising at a gym on the days she does not have PR. She feels this has helped her stress levels decrease. Staff will educate Candace Frey on healthy ways to cope with stress. Candace Frey is still dealing with the stress of her husband's declining health and cognition. Her husband also had a recent fall  that has brought on some added stress. Candace Frey has been educated on healthy ways to manage her stress and has implemented them into her daily life. 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 This has caused her stress and she has missed a couple of days of PR due to this. The last couple of weeks have been very stressful for [place]. Her husband's sister has a terminal illness and they recently had to fly to [place] to say goodbye to her. The traveling alone was very hard for Candace Frey because of her husbands dementia. She denies needing psychotropic meds or a referral to a therapist at this time.    Expected Outcomes For Candace Frey to participate in PR free of any psychosocial barrier or concerns. For Candace Frey to participate in PR free of any psychosocial barrier or concerns. For Candace Frey to participate in PR with decreased stress. For Candace Frey to participate in PR with decreased stress.    Interventions Encouraged to attend Pulmonary Rehabilitation for the exercise;Stress management education Encouraged to attend Pulmonary Rehabilitation for the exercise Encouraged to  attend Pulmonary Rehabilitation for the exercise;Stress management education Therapist referral;Stress management education;Encouraged to attend Pulmonary Rehabilitation for the exercise    Continue Psychosocial Services  Follow up required by staff Follow up required by staff No Follow up required Follow up required by staff             Psychosocial Discharge (Final Psychosocial Re-Evaluation):  Psychosocial Re-Evaluation - 09/12/23 1050       Psychosocial Re-Evaluation   Current issues with Current Stress Concerns    Comments The last couple of weeks have been very stressful for [place]. Her husband's sister has a terminal illness and they recently had to fly to [place] to say goodbye to her. The traveling alone was very hard for Candace Frey because of her husbands dementia. She denies needing psychotropic meds or a referral to a therapist at  this time.    Expected Outcomes For Candace Frey to participate in PR with decreased stress.    Interventions Therapist referral;Stress management education;Encouraged to attend Pulmonary Rehabilitation for the exercise    Continue Psychosocial Services  Follow up required by staff             Education: Education Goals: Education classes will be provided on a weekly basis, covering required topics. Participant will state understanding/return demonstration of topics presented.  Learning Barriers/Preferences:   Education Topics: Know Your Numbers Group instruction that is supported by a PowerPoint presentation. Instructor discusses importance of knowing and understanding resting, exercise, and post-exercise oxygen saturation, heart rate, and blood pressure. Oxygen saturation, heart rate, blood pressure, rating of perceived exertion, and dyspnea are reviewed along with a normal range for these values.  Flowsheet Row PULMONARY REHAB OTHER RESPIRATORY from 08/25/2023 in [facility]  Date 08/25/23  Educator EP  Instruction Review Code 1- Verbalizes Understanding       Exercise for the Pulmonary Patient Group instruction that is supported by a PowerPoint presentation. Instructor discusses benefits of exercise, core components of exercise, frequency, duration, and intensity of an exercise routine, importance of utilizing pulse oximetry during exercise, safety while exercising, and options of places to exercise outside of rehab.  Flowsheet Row PULMONARY REHAB OTHER RESPIRATORY from 08/18/2023 in [facility]  Date 08/18/23  Educator EP  Instruction Review Code 1- Verbalizes Understanding       MET Level  Group instruction provided by PowerPoint, verbal discussion, and written material to support subject matter. Instructor reviews what METs are and how to increase METs.  Flowsheet  Row PULMONARY REHAB OTHER RESPIRATORY from 07/21/2023 in [facility]  Date 07/21/23  Educator EP  Instruction Review Code 1- Verbalizes Understanding       Pulmonary Medications Verbally interactive group education provided by instructor with focus on inhaled medications and proper administration.   Anatomy and Physiology of the Respiratory System Group instruction provided by PowerPoint, verbal discussion, and written material to support subject matter. Instructor reviews respiratory cycle and anatomical components of the respiratory system and their functions. Instructor also reviews differences in obstructive and restrictive respiratory diseases with examples of each.    Oxygen Safety Group instruction provided by PowerPoint, verbal discussion, and written material to support subject matter. There is an overview of "What is Oxygen" and "Why do we need it".  Instructor also reviews how to create a safe environment for oxygen use, the importance of using oxygen as  prescribed, and the risks of noncompliance. There is a brief discussion on traveling with oxygen and resources the patient may utilize.   Oxygen Use Group instruction provided by PowerPoint, verbal discussion, and written material to discuss how supplemental oxygen is prescribed and different types of oxygen supply systems. Resources for more information are provided.  Flowsheet Row PULMONARY REHAB OTHER RESPIRATORY from 09/08/2023 in [facility]  Date 09/08/23  Educator RT  Instruction Review Code 1- Verbalizes Understanding       Breathing Techniques Group instruction that is supported by demonstration and informational handouts. Instructor discusses the benefits of pursed lip and diaphragmatic breathing and detailed demonstration on how to perform both.  Flowsheet Row PULMONARY REHAB OTHER RESPIRATORY from  06/23/2023 in [facility]  Date 06/23/23  Educator RN  Instruction Review Code 1- Verbalizes Understanding        Risk Factor Reduction Group instruction that is supported by a PowerPoint presentation. Instructor discusses the definition of a risk factor, different risk factors for pulmonary disease, and how the heart and lungs work together. Flowsheet Row PULMONARY REHAB OTHER RESPIRATORY from 07/14/2023 in [facility]  Date 07/14/23  Educator EP  Instruction Review Code 1- Verbalizes Understanding       Pulmonary Diseases Group instruction provided by PowerPoint, verbal discussion, and written material to support subject matter. Instructor gives an overview of the different type of pulmonary diseases. There is also a discussion on risk factors and symptoms as well as ways to manage the diseases.   Stress and Energy Conservation Group instruction provided by PowerPoint, verbal discussion, and written material to support subject matter. Instructor gives an overview of stress and the impact it can have on the body. Instructor also reviews ways to reduce stress. There is also a discussion on energy conservation and ways to conserve energy throughout the day.   Warning Signs and Symptoms Group instruction provided by PowerPoint, verbal discussion, and written material to support subject matter. Instructor reviews warning signs and symptoms of stroke, heart attack, cold and flu. Instructor also reviews ways to prevent the spread of infection.   Other Education Group or individual verbal, written, or video instructions that support the educational goals of the pulmonary rehab program. Flowsheet Row PULMONARY REHAB OTHER RESPIRATORY from 07/28/2023 in [facility]  Date 07/28/23  Educator RT  Instruction Review Code 1-  Verbalizes Understanding        Knowledge Questionnaire Score:  Knowledge Questionnaire Score - 06/06/23 1120       Knowledge Questionnaire Score   Pre Score 16/18             Core Components/Risk Factors/Patient Goals at Admission:  Personal Goals and Risk Factors at Admission - 06/06/23 1101       Core Components/Risk Factors/Patient Goals on Admission    Weight Management Weight Loss    Improve shortness of breath with ADL's Yes    Intervention Provide education, individualized exercise plan and daily activity instruction to help decrease symptoms of SOB with activities of daily living.    Expected Outcomes Long Term: Be able to perform more ADLs without symptoms or delay the onset of symptoms;Short Term: Improve cardiorespiratory fitness to achieve a reduction of symptoms when performing ADLs  Core Components/Risk Factors/Patient Goals Review:   Goals and Risk Factor Review     Row Name 06/22/23 1545 07/18/23 1444 08/15/23 1530 09/12/23 1055       Core Components/Risk Factors/Patient Goals Review   Personal Goals Review Weight Management/Obesity;Improve shortness of breath with ADL's;Develop more efficient breathing techniques such as purse lipped breathing and diaphragmatic breathing and practicing self-pacing with activity. Weight Management/Obesity;Improve shortness of breath with ADL's Weight Management/Obesity;Improve shortness of breath with ADL's Weight Management/Obesity;Improve shortness of breath with ADL's    Review Goal progressing for wieght loss. Candace Frey is working with staff dietician to achieve weight loss goals. Goal progressing on improving her shortness of breath with ADLs. Goal progressing on developing more efficient breathing techniques such as purse lipped breathing and diaphragmatic breathing; and practicing self-pacing with activity. Candace Frey has only attended 3 classes. We will continue to monitor her progress. Goal progressing for wieght  loss. Candace Frey is working with staff dietician to achieve weight loss goals. Goal progressing on improving her shortness of breath with ADLs. Goal met on developing more efficient breathing techniques such as purse lipped breathing and diaphragmatic breathing; and practicing self-pacing with activity. Candace Frey is able to demonstrate purse lip breathing when she gets short of breath. We will continue to monitor her progress. Goal progressing for wieght loss. Candace Frey is working with staff dietician to achieve weight loss goals. Goal progressing on improving her shortness of breath with ADLs. We will continue to monitor her progress. Goal progressing for wieght loss. Candace Frey is working with staff dietician to achieve weight loss goals. Goal progressing on improving her shortness of breath with ADLs. Candace Frey enjoys coming to class and states the exercising helps reduce her stress. Candace Frey has been able to increase her intensity and MET's. Candace Frey has also been able to maintain sats of 94-96% on RA while exercising. We will continue to monitor her progress throughout the program.    Expected Outcomes See admission goals See admission goals See admission goals For Candace Frey to lose weight and improve her shortness of breath with ADL's.             Core Components/Risk Factors/Patient Goals at Discharge (Final Review):   Goals and Risk Factor Review - 09/12/23 1055       Core Components/Risk Factors/Patient Goals Review   Personal Goals Review Weight Management/Obesity;Improve shortness of breath with ADL's    Review Goal progressing for wieght loss. Candace Frey is working with staff dietician to achieve weight loss goals. Goal progressing on improving her shortness of breath with ADLs. Candace Frey enjoys coming to class and states the exercising helps reduce her stress. Candace Frey has been able to increase her intensity and MET's. Candace Frey has also been able to maintain sats of 94-96% on RA while exercising. We will continue to monitor her progress throughout the  program.    Expected Outcomes For Candace Frey to lose weight and improve her shortness of breath with ADL's.             ITP Comments:Pt is making expected progress toward Pulmonary Rehab goals after completing 17 sessions. Recommend continued exercise, life style modification, education, and utilization of breathing techniques to increase stamina and strength, while also decreasing shortness of breath with exertion.  Dr. Mechele Collin is Medical Director for Pulmonary Rehab at [facility].

## 2023-09-22 ENCOUNTER — Encounter (HOSPITAL_COMMUNITY): Payer: Medicare Other

## 2023-09-27 ENCOUNTER — Encounter (HOSPITAL_COMMUNITY): Payer: Medicare Other

## 2023-09-28 ENCOUNTER — Telehealth (HOSPITAL_COMMUNITY): Payer: Self-pay

## 2023-09-28 NOTE — Progress Notes (Signed)
Discharge Progress Report  Patient Details  Name: Candace Frey MRN: [id number] Date of Birth: 11-08-1951 Referring Provider:   [name] Pulmonary Rehab Walk Test from 06/06/2023 in [facility]  Referring Provider Icard        Number of Visits: 17  Reason for Discharge:  Early Exit:  Personal  Smoking History:  Social History   Tobacco Use  Smoking Status Former   Current packs/day: 0.00   Average packs/day: 2.0 packs/day for 43.0 years (86.0 ttl pk-yrs)   Types: Cigarettes   Start date: 11/23/1963   Quit date: 11/22/2006   Years since quitting: 16.8  Smokeless Tobacco Never    Diagnosis:  Stage 1 mild COPD by GOLD classification (HCC)  ADL UCSD:  Pulmonary Assessment Scores     Row Name 06/06/23 1115         ADL UCSD   SOB Score total 28       CAT Score   CAT Score 8       mMRC Score   mMRC Score 2              Initial Exercise Prescription:  Initial Exercise Prescription - 06/06/23 1200       Date of Initial Exercise RX and Referring Provider   Date 06/06/23    Referring Provider Icard    Expected Discharge Date 09/01/23      Treadmill   MPH 2    Grade 0    Minutes 15      Recumbant Elliptical   Level 1    Minutes 15    METs 2      Prescription Details   Frequency (times per week) 2    Duration Progress to 30 minutes of continuous aerobic without signs/symptoms of physical distress      Intensity   THRR 40-80% of Max Heartrate 58-117    Ratings of Perceived Exertion 11-13    Perceived Dyspnea 0-4      Progression   Progression Continue to progress workloads to maintain intensity without signs/symptoms of physical distress.      Resistance Training   Training Prescription Yes    Weight blue bands    Reps 10-15             Discharge Exercise Prescription (Final Exercise Prescription Changes):  Exercise Prescription Changes - 09/08/23 0930        Response to Exercise   Blood Pressure (Admit) 118/68    Blood Pressure (Exit) 100/54    Heart Rate (Admit) 95 bpm    Heart Rate (Exercise) 110 bpm    Heart Rate (Exit) 80 bpm    Oxygen Saturation (Admit) 97 %    Oxygen Saturation (Exercise) 95 %    Oxygen Saturation (Exit) 96 %    Rating of Perceived Exertion (Exercise) 11    Perceived Dyspnea (Exercise) 2    Duration Continue with 30 min of aerobic exercise without signs/symptoms of physical distress.    Intensity THRR unchanged      Progression   Progression Continue to progress workloads to maintain intensity without signs/symptoms of physical distress.      Resistance Training   Training Prescription Yes    Weight black bands    Reps 10-15    Time 10 Minutes      Treadmill   MPH 3    Grade 3    Minutes 15    METs 4.4  Recumbant Elliptical   Level 6    Minutes 15    METs 4.4             Functional Capacity:  6 Minute Walk     Row Name 06/06/23 1212         6 Minute Walk   Phase Initial     Distance 1290 feet     Walk Time 6 minutes     # of Rest Breaks 0     MPH 2.44     METS 3.11     RPE 13     Perceived Dyspnea  2.5     VO2 Peak 10.87     Symptoms No     Resting HR 73 bpm     Resting BP 112/64     Resting Oxygen Saturation  98 %     Exercise Oxygen Saturation  during 6 min walk 93 %     Max Ex. HR 121 bpm     Max Ex. BP 142/68     2 Minute Post BP 120/68       Interval HR   1 Minute HR 101     2 Minute HR 111     3 Minute HR 111     4 Minute HR 121     5 Minute HR 118     6 Minute HR 117     2 Minute Post HR 84     Interval Heart Rate? Yes       Interval Oxygen   Interval Oxygen? Yes     Baseline Oxygen Saturation % 98 %     1 Minute Oxygen Saturation % 97 %     1 Minute Liters of Oxygen 0 L     2 Minute Oxygen Saturation % 96 %     2 Minute Liters of Oxygen 0 L     3 Minute Oxygen Saturation % 95 %     3 Minute Liters of Oxygen 0 L     4 Minute Oxygen Saturation % 93 %      4 Minute Liters of Oxygen 0 L     5 Minute Oxygen Saturation % 94 %     5 Minute Liters of Oxygen 0 L     6 Minute Oxygen Saturation % 97 %     6 Minute Liters of Oxygen 0 L     2 Minute Post Oxygen Saturation % 99 %     2 Minute Post Liters of Oxygen 0 L              Psychological, QOL, Others - Outcomes: PHQ 2/9:    07/18/2023    9:44 AM 06/06/2023   11:21 AM 01/11/2023   10:18 AM 12/30/2022    2:36 PM 12/25/2021    9:38 AM  Depression screen PHQ 2/9  Decreased Interest 0 0 0 0 0  Down, Depressed, Hopeless 0 0 0 0 0  PHQ - 2 Score 0 0 0 0 0  Altered sleeping 0 0 0  0  Tired, decreased energy 1 1 0  0  Change in appetite 1 1 1   0  Feeling bad or failure about yourself  0 0 0  0  Trouble concentrating 0 0 0  0  Moving slowly or fidgety/restless 0 0 0  0  Suicidal thoughts 0 0 0  0  PHQ-9 Score 2 2 1   0  Difficult doing work/chores Not difficult at all  Not difficult at all Not difficult at all  Not difficult at all    Quality of Life:   Personal Goals: Goals established at orientation with interventions provided to work toward goal.  Personal Goals and Risk Factors at Admission - 06/06/23 1101       Core Components/Risk Factors/Patient Goals on Admission    Weight Management Weight Loss    Improve shortness of breath with ADL's Yes    Intervention Provide education, individualized exercise plan and daily activity instruction to help decrease symptoms of SOB with activities of daily living.    Expected Outcomes Long Term: Be able to perform more ADLs without symptoms or delay the onset of symptoms;Short Term: Improve cardiorespiratory fitness to achieve a reduction of symptoms when performing ADLs              Personal Goals Discharge:  Goals and Risk Factor Review     Row Name 06/22/23 1545 07/18/23 1444 08/15/23 1530 09/12/23 1055 09/28/23 1239     Core Components/Risk Factors/Patient Goals Review   Personal Goals Review Weight Management/Obesity;Improve  shortness of breath with ADL's;Develop more efficient breathing techniques such as purse lipped breathing and diaphragmatic breathing and practicing self-pacing with activity. Weight Management/Obesity;Improve shortness of breath with ADL's Weight Management/Obesity;Improve shortness of breath with ADL's Weight Management/Obesity;Improve shortness of breath with ADL's Weight Management/Obesity;Improve shortness of breath with ADL's   Review Goal progressing for wieght loss. Candace Frey is working with staff dietician to achieve weight loss goals. Goal progressing on improving her shortness of breath with ADLs. Goal progressing on developing more efficient breathing techniques such as purse lipped breathing and diaphragmatic breathing; and practicing self-pacing with activity. Candace Frey has only attended 3 classes. We will continue to monitor her progress. Goal progressing for wieght loss. Candace Frey is working with staff dietician to achieve weight loss goals. Goal progressing on improving her shortness of breath with ADLs. Goal met on developing more efficient breathing techniques such as purse lipped breathing and diaphragmatic breathing; and practicing self-pacing with activity. Candace Frey is able to demonstrate purse lip breathing when she gets short of breath. We will continue to monitor her progress. Goal progressing for wieght loss. Candace Frey is working with staff dietician to achieve weight loss goals. Goal progressing on improving her shortness of breath with ADLs. We will continue to monitor her progress. Goal progressing for wieght loss. Candace Frey is working with staff dietician to achieve weight loss goals. Goal progressing on improving her shortness of breath with ADLs. Candace Frey enjoys coming to class and states the exercising helps reduce her stress. Candace Frey has been able to increase her intensity and MET's. Candace Frey has also been able to maintain sats of 94-96% on RA while exercising. We will continue to monitor her progress throughout the program. Candace Frey  was discharged from the PR program on 09/28/23 due to medical issues that are requiring her hold exercise at this time. She did meet her goal for weight loss. She is down 9.7#'s since starting the program. We were unable to evaluate her goal of improving shortness of breath with ADL's due to [facility] not completeing her SOB survey. Candace Frey was a pleasure to have in the program.   Expected Outcomes See admission goals See admission goals See admission goals For Candace Frey to lose weight and improve her shortness of breath with ADL's. To continue to exercise and modify her nutrition and lifestyle post graduation            Exercise Goals and Review:  Exercise Goals  Row Name 06/06/23 1055 06/17/23 1100 07/21/23 1609         Exercise Goals   Increase Physical Activity Yes Yes Yes     Intervention Provide advice, education, support and counseling about physical activity/exercise needs.;Develop an individualized exercise prescription for aerobic and resistive training based on initial evaluation findings, risk stratification, comorbidities and participant's personal goals. Provide advice, education, support and counseling about physical activity/exercise needs.;Develop an individualized exercise prescription for aerobic and resistive training based on initial evaluation findings, risk stratification, comorbidities and participant's personal goals. Provide advice, education, support and counseling about physical activity/exercise needs.;Develop an individualized exercise prescription for aerobic and resistive training based on initial evaluation findings, risk stratification, comorbidities and participant's personal goals.     Expected Outcomes Long Term: Exercising regularly at least 3-5 days a week.;Short Term: Attend rehab on a regular basis to increase amount of physical activity.;Long Term: Add in home exercise to make exercise part of routine and to increase amount of physical activity. Long Term: Exercising  regularly at least 3-5 days a week.;Short Term: Attend rehab on a regular basis to increase amount of physical activity.;Long Term: Add in home exercise to make exercise part of routine and to increase amount of physical activity. Long Term: Exercising regularly at least 3-5 days a week.;Short Term: Attend rehab on a regular basis to increase amount of physical activity.;Long Term: Add in home exercise to make exercise part of routine and to increase amount of physical activity.     Increase Strength and Stamina Yes Yes Yes     Intervention Provide advice, education, support and counseling about physical activity/exercise needs.;Develop an individualized exercise prescription for aerobic and resistive training based on initial evaluation findings, risk stratification, comorbidities and participant's personal goals. Provide advice, education, support and counseling about physical activity/exercise needs.;Develop an individualized exercise prescription for aerobic and resistive training based on initial evaluation findings, risk stratification, comorbidities and participant's personal goals. Provide advice, education, support and counseling about physical activity/exercise needs.;Develop an individualized exercise prescription for aerobic and resistive training based on initial evaluation findings, risk stratification, comorbidities and participant's personal goals.     Expected Outcomes Short Term: Increase workloads from initial exercise prescription for resistance, speed, and METs.;Short Term: Perform resistance training exercises routinely during rehab and add in resistance training at home;Long Term: Improve cardiorespiratory fitness, muscular endurance and strength as measured by increased METs and functional capacity ( ) Short Term: Increase workloads from initial exercise prescription for resistance, speed, and METs.;Short Term: Perform resistance training exercises routinely during rehab and add in  resistance training at home;Long Term: Improve cardiorespiratory fitness, muscular endurance and strength as measured by increased METs and functional capacity ( ) Short Term: Increase workloads from initial exercise prescription for resistance, speed, and METs.;Short Term: Perform resistance training exercises routinely during rehab and add in resistance training at home;Long Term: Improve cardiorespiratory fitness, muscular endurance and strength as measured by increased METs and functional capacity ( )     Able to understand and use rate of perceived exertion (RPE) scale Yes Yes Yes     Intervention Provide education and explanation on how to use RPE scale Provide education and explanation on how to use RPE scale Provide education and explanation on how to use RPE scale     Expected Outcomes Short Term: Able to use RPE daily in rehab to express subjective intensity level;Long Term:  Able to use RPE to guide intensity level when exercising independently Short Term: Able to use RPE daily in rehab  to express subjective intensity level;Long Term:  Able to use RPE to guide intensity level when exercising independently Short Term: Able to use RPE daily in rehab to express subjective intensity level;Long Term:  Able to use RPE to guide intensity level when exercising independently     Able to understand and use Dyspnea scale Yes Yes Yes     Intervention Provide education and explanation on how to use Dyspnea scale Provide education and explanation on how to use Dyspnea scale Provide education and explanation on how to use Dyspnea scale     Expected Outcomes Short Term: Able to use Dyspnea scale daily in rehab to express subjective sense of shortness of breath during exertion;Long Term: Able to use Dyspnea scale to guide intensity level when exercising independently Short Term: Able to use Dyspnea scale daily in rehab to express subjective sense of shortness of breath during exertion;Long Term: Able to use  Dyspnea scale to guide intensity level when exercising independently Short Term: Able to use Dyspnea scale daily in rehab to express subjective sense of shortness of breath during exertion;Long Term: Able to use Dyspnea scale to guide intensity level when exercising independently     Knowledge and understanding of Target Heart Rate Range (THRR) Yes Yes Yes     Intervention Provide education and explanation of THRR including how the numbers were predicted and where they are located for reference Provide education and explanation of THRR including how the numbers were predicted and where they are located for reference Provide education and explanation of THRR including how the numbers were predicted and where they are located for reference     Expected Outcomes Short Term: Able to state/look up THRR;Short Term: Able to use daily as guideline for intensity in rehab;Long Term: Able to use THRR to govern intensity when exercising independently Short Term: Able to state/look up THRR;Short Term: Able to use daily as guideline for intensity in rehab;Long Term: Able to use THRR to govern intensity when exercising independently Short Term: Able to state/look up THRR;Short Term: Able to use daily as guideline for intensity in rehab;Long Term: Able to use THRR to govern intensity when exercising independently     Understanding of Exercise Prescription Yes Yes Yes     Intervention Provide education, explanation, and written materials on patient's individual exercise prescription Provide education, explanation, and written materials on patient's individual exercise prescription Provide education, explanation, and written materials on patient's individual exercise prescription     Expected Outcomes Short Term: Able to explain program exercise prescription;Long Term: Able to explain home exercise prescription to exercise independently Short Term: Able to explain program exercise prescription;Long Term: Able to explain home  exercise prescription to exercise independently Short Term: Able to explain program exercise prescription;Long Term: Able to explain home exercise prescription to exercise independently              Exercise Goals Re-Evaluation:  Exercise Goals Re-Evaluation     Row Name 06/17/23 1100 07/21/23 1609 08/18/23 0834 09/15/23 1628 09/28/23 0916     Exercise Goal Re-Evaluation   Exercise Goals Review Increase Physical Activity;Able to understand and use Dyspnea scale;Understanding of Exercise Prescription;Increase Strength and Stamina;Knowledge and understanding of Target Heart Rate Range (THRR);Able to understand and use rate of perceived exertion (RPE) scale Increase Physical Activity;Able to understand and use Dyspnea scale;Understanding of Exercise Prescription;Increase Strength and Stamina;Knowledge and understanding of Target Heart Rate Range (THRR);Able to understand and use rate of perceived exertion (RPE) scale Increase Physical Activity;Able to understand  and use Dyspnea scale;Understanding of Exercise Prescription;Increase Strength and Stamina;Knowledge and understanding of Target Heart Rate Range (THRR);Able to understand and use rate of perceived exertion (RPE) scale Increase Physical Activity;Able to understand and use Dyspnea scale;Understanding of Exercise Prescription;Increase Strength and Stamina;Knowledge and understanding of Target Heart Rate Range (THRR);Able to understand and use rate of perceived exertion (RPE) scale Increase Physical Activity;Able to understand and use Dyspnea scale;Understanding of Exercise Prescription;Increase Strength and Stamina;Knowledge and understanding of Target Heart Rate Range (THRR);Able to understand and use rate of perceived exertion (RPE) scale   Comments Candace Frey has completed 2 exercise sessions. She exercises for 15 min on the treadmill and recumbent elliptical. Candace Frey averages 2.53 METs at 2.0 mph and 0 incline on the treadmill and 1.7 METs at level 1 on  the recumbent elliptical. She performs the warmup and cooldown standing without limitations. It is too soon to note any discernable progressions. Will continue to monitor and progress as able. Candace Frey has completed 9 exercise sessions. She exercises for 15 min on the treadmill and recumbent elliptical. Candace Frey averages 3.296 METs at 2.5 mph and 1 incline on the treadmill and 4.6 METs at level 3 on the recumbent elliptical. She performs the warmup and cooldown standing without limitations. Candace Frey has increased her workload for both exercise modes as she tolerates progressions well. She is very motivated to exercise and is trying to understand her limits. Candace Frey feels she has benefitted thus far from PR. Will continue to monitor and progress as able. Candace Frey has completed 13 exercise sessions. She exercises for 15 min on the treadmill and recumbent elliptical. Candace Frey averages 3.71 METs at 3.0 mph and 1% incline on the treadmill and 4.7 METs at level 6 on the recumbent elliptical. She performs the warmup and cooldown standing without limitations. Candace Frey is still increasing her workloads for both exercise modes. I am unsure what her exact METs as she has progressed to interval training. Candace Frey tolerates interval training well and understands her limits. Candace Frey remains motivated to exercise. Will discuss home exercise soon. Will continue to monitor and progress as able. Candace Frey has completed 17 exercise sessions. She exercises for 15 min on the treadmill and recumbent elliptical. Candace Frey averages 4.4 METs at 3.0 mph and 3% incline on the treadmill and 4.4 METs at level 6 on the recumbent elliptical. She performs the warmup and cooldown standing without limitations. Candace Frey has somewhat increased her workload for the treadmill as METs have also increased. She is also doing interval training on the recumbent elliptical. She has a better understanding of setting up her exercise equipment. She was unable to do interval training last exercise session due to fatigue,  and is currently be evaluated for a strained muscle or hernia. Will continue to monitor and progress as able. Candace Frey has completed 17 exercise sessions. Her peak METs were 4.4 on the treadmill and 4.8 on the recumbent elliptical.   Expected Outcomes Through exercise at rehab and home, the patient will decrease shortness of breath with daily activities and feel confident in carrying out an exercise regimen at home. Through exercise at rehab and home, the patient will decrease shortness of breath with daily activities and feel confident in carrying out an exercise regimen at home. Through exercise at rehab and home, the patient will decrease shortness of breath with daily activities and feel confident in carrying out an exercise regimen at home. Through exercise at rehab and home, the patient will decrease shortness of breath with daily activities and feel confident in carrying  out an exercise regimen at home. Through exercise at rehab and home, the patient will decrease shortness of breath with daily activities and feel confident in carrying out an exercise regimen at home.            Nutrition & Weight - Outcomes:  Pre Biometrics - 06/06/23 1041       Pre Biometrics   Grip Strength 22 kg              Nutrition:  Nutrition Therapy & Goals - 09/15/23 1135       Nutrition Therapy   Diet General Healthy Diet      Personal Nutrition Goals   Nutrition Goal Patient to improve diet quality by using the plate method as a guide for meal planning to include lean protein/plant protein, fruits, vegetables, whole grains, nonfat dairy as part of a well-balanced diet.   goal in action.   Comments Goals in action. Candace Frey reports no nutrition concerns at this time. Candace Frey is down ~9.7# since starting with our program; most recent TSH was low and medication was adjusted per documentation 07/18/23. BMI remains appropriate. We have discussed protein needs/ protein intake and supplements. Candace Frey will continue to  benefit from participation in pulmonary rehab for nutrition and exercise support.      Intervention Plan   Intervention Prescribe, educate and counsel regarding individualized specific dietary modifications aiming towards targeted core components such as weight, hypertension, lipid management, diabetes, heart failure and other comorbidities.;Nutrition handout(s) given to patient.    Expected Outcomes Short Term Goal: Understand basic principles of dietary content, such as calories, fat, sodium, cholesterol and nutrients.;Long Term Goal: Adherence to prescribed nutrition plan.             Nutrition Discharge:   Education Questionnaire Score:  Knowledge Questionnaire Score - 06/06/23 1120       Knowledge Questionnaire Score   Pre Score 16/18             Goals reviewed with patient; copy given to patient.  Dr. Mechele Collin is Medical Director for Pulmonary Rehab at [facility].

## 2023-09-28 NOTE — Telephone Encounter (Signed)
Called Candace Frey to check in on her. Candace Frey had a lump on her groin and was seen by her OBGYN. OBGYN has requested Candace Frey to pause on exercise until seen again. I mentioned to [facility] discharge and receiving a new referral for Pulmonary Rehab. Discussed how to get a new referral. Pt voiced understanding of discharge and how to get a new referral.

## 2023-09-29 ENCOUNTER — Encounter (HOSPITAL_COMMUNITY): Payer: Medicare Other

## 2023-10-03 DIAGNOSIS — H353131 Nonexudative age-related macular degeneration, bilateral, early dry stage: Secondary | ICD-10-CM | POA: Diagnosis not present

## 2023-10-03 DIAGNOSIS — H2513 Age-related nuclear cataract, bilateral: Secondary | ICD-10-CM | POA: Diagnosis not present

## 2023-10-04 ENCOUNTER — Encounter (HOSPITAL_COMMUNITY): Payer: Medicare Other

## 2023-10-06 ENCOUNTER — Encounter (HOSPITAL_COMMUNITY): Payer: Medicare Other

## 2023-10-07 DIAGNOSIS — H353132 Nonexudative age-related macular degeneration, bilateral, intermediate dry stage: Secondary | ICD-10-CM | POA: Diagnosis not present

## 2023-10-07 DIAGNOSIS — H2513 Age-related nuclear cataract, bilateral: Secondary | ICD-10-CM | POA: Diagnosis not present

## 2023-10-07 DIAGNOSIS — H43813 Vitreous degeneration, bilateral: Secondary | ICD-10-CM | POA: Diagnosis not present

## 2023-10-24 ENCOUNTER — Ambulatory Visit (INDEPENDENT_AMBULATORY_CARE_PROVIDER_SITE_OTHER): Payer: Medicare Other | Admitting: Physician Assistant

## 2023-10-24 ENCOUNTER — Encounter: Payer: Self-pay | Admitting: Physician Assistant

## 2023-10-24 VITALS — BP 120/74 | HR 81 | Temp 97.5°F

## 2023-10-24 DIAGNOSIS — R1031 Right lower quadrant pain: Secondary | ICD-10-CM | POA: Diagnosis not present

## 2023-10-24 DIAGNOSIS — Z23 Encounter for immunization: Secondary | ICD-10-CM | POA: Diagnosis not present

## 2023-10-24 DIAGNOSIS — E039 Hypothyroidism, unspecified: Secondary | ICD-10-CM | POA: Diagnosis not present

## 2023-10-24 NOTE — Patient Instructions (Signed)
It was great to see you!  Blood work to be done today  You will get a phone call from [facility] to discuss getting your ultrasound done  If new/worsening symptom(s), please reach out!!!  Take care,  Jarold Motto PA-C

## 2023-10-24 NOTE — Progress Notes (Signed)
Candace Frey is a 72 y.o. female here for a follow-up of a pre-existing problem.  History of Present Illness:   Chief Complaint  Patient presents with   Groin Pain    Pt c/o right sided groin pain, started on 08/23/2023, saw GYN 10/22 had U/S done, pain is still  off and on. Pt said pain is when she stretches her leg back.   HPI  Groin Pain (Right-sided): Seen by Eugenie Birks, FNP with [facility] on 10/22 for similar issue.  Reported that she had started pulmonary rehab, and had pushed herself to take a long walk - pain began soon after.  Described constant, moderate, and dull/achy pain that is worsened with movement/exercise.  She had an ultrasound done with her gynecologist who reportedly did not find any abnormalities.   Reports that her pain started about 2 months ago, a couple of days following a trip to [place] when she had decreased her activity and noticed her ROM when walking was affected by a mass in her right groin.   States that the mass has since decreased but she still feels a pulling sensation when walking.  Notes that the skin is still firm in the area.  Hx of vein ligation (7 years ago), DVT (following an arthroscopic knee surgery), and genital warts (hasn't had since having a child)  Endorses occasional LE swelling (wears compression stockings). Denies vaginal discharge, fever/chills, unintentional weight changes, night sweats, or diffusion of pain.  Past Medical History:  Diagnosis Date   Acute deep vein thrombosis (DVT) of distal vein of right lower extremity (HCC) 07/22/2016   Acute medial meniscal tear 01/29/2015   Chicken pox    Chronic obstructive lung disease (HCC) 05/23/2019   Colon polyps    Complication of anesthesia    slow to wake up    COPD (chronic obstructive pulmonary disease) (HCC)    Genital warts    Multiple pulmonary nodules determined by computed tomography of lung 07/05/2017   CT 03/03/17  Right lower lobe  nodularity described on the prior exam is less apparent today, favored to represent an area of scarring. other tiny nodules described on the prior are not readily apparent. No enlarging or dominant nodule identified > rec continue low dose annual  screening until 2023 per PCP (pt has fm hx lung ca)    Osteopenia    Shortness of breath dyspnea    with exertion was a smoker for many year per patient    Thyroid disease    UTI (urinary tract infection)     Social History   Tobacco Use   Smoking status: Former    Current packs/day: 0.00    Average packs/day: 2.0 packs/day for 43.0 years (86.0 ttl pk-yrs)    Types: Cigarettes    Start date: 11/23/1963    Quit date: 11/22/2006    Years since quitting: 16.9   Smokeless tobacco: Never  Vaping Use   Vaping status: Never Used  Substance Use Topics   Alcohol use: Yes    Alcohol/week: 7.0 standard drinks of alcohol    Types: 7 Glasses of wine per week    Comment: 2 glasses of wine per night   Drug use: No   Past Surgical History:  Procedure Laterality Date   BREAST BIOPSY     COLONOSCOPY     KNEE ARTHROSCOPY Right 01/29/2015   meniscus repair Procedure: RIGHT KNEE ARTHROSCOPY WITH DEBRIDEMENT  ;  Surgeon: Ollen Gross, MD;  Location: [facility];  Service: Orthopedics;  Laterality: Right;   THYROIDECTOMY, PARTIAL  1998   Family History  Problem Relation Age of Onset   Dementia Mother        lewy body dementia   Lung cancer Father    Prostate cancer Father        mets to bone   CVA Father        late 28s. smoker   Alcoholism Father    Alcoholism Paternal Grandfather    Colon cancer Neg Hx    Esophageal cancer Neg Hx    Rectal cancer Neg Hx    Stomach cancer Neg Hx    No Known Allergies Current Medications:   Current Outpatient Medications:    albuterol (VENTOLIN HFA) 108 (90 Base) MCG/ACT inhaler, Inhale 1-2 puffs into the lungs every 6 (six) hours as needed., Disp: 8 g, Rfl: 2   Calcium Carbonate-Vitamin D (CALCIUM + D PO), Take 1  tablet by mouth daily. , Disp: , Rfl:    Estradiol 0.75 MG/1.25 GM (0.06%) topical gel, Place 1.25 g onto the skin daily., Disp: , Rfl:    levothyroxine (SYNTHROID) 88 MCG tablet, Take 1 tablet (88 mcg total) by mouth daily., Disp: 90 tablet, Rfl: 3   Multiple Vitamins-Minerals (PRESERVISION AREDS 2 PO), Take 1 tablet by mouth 2 (two) times daily., Disp: , Rfl:    progesterone (PROMETRIUM) 100 MG capsule, Take 100 mg by mouth at bedtime., Disp: , Rfl:    tacrolimus (PROTOPIC) 0.1 % ointment, Apply 1 Application topically as needed., Disp: , Rfl:   Review of Systems:   ROS See pertinent positives and negatives as per the HPI.  Vitals:   Vitals:   10/24/23 1338  BP: 120/74  Pulse: 81  Temp: (!) 97.5 F (36.4 C)  TempSrc: Temporal  SpO2: 96%     There is no height or weight on file to calculate BMI.  Physical Exam:   Physical Exam Constitutional:      Appearance: Normal appearance. She is well-developed.  HENT:     Head: Normocephalic and atraumatic.  Eyes:     General: Lids are normal.     Extraocular Movements: Extraocular movements intact.     Conjunctiva/sclera: Conjunctivae normal.  Pulmonary:     Effort: Pulmonary effort is normal.  Abdominal:     General: Abdomen is flat. Bowel sounds are normal.     Palpations: Abdomen is soft.     Comments: No abdominal tenderness to palpation but endorses "sensitivity"  Fullness to right inguinal area more easily palpated when patient is standing; no tenderness to palpation   Musculoskeletal:        General: Normal range of motion.     Cervical back: Normal range of motion and neck supple.  Skin:    General: Skin is warm and dry.  Neurological:     Mental Status: She is alert and oriented to person, place, and time.  Psychiatric:        Attention and Perception: Attention and perception normal.        Mood and Affect: Mood normal.        Behavior: Behavior normal.        Thought Content: Thought content normal.         Judgment: Judgment normal.     Assessment and Plan:   Groin pain, right Unclear etiology DDx includes, but is not limited to: lymphadenopathy, inguinal hernia, mass, muscle strain, among others Will obtain dedicated ultrasound for further evaluation  If no obvious explanation of symptom(s), will refer back to Primary Care Provider (PCP) vs sports medicine for further evaluation  Need for immunization against influenza Updated today  I,Emily Lagle,acting as a scribe for Jarold Motto, PA.,have documented all relevant documentation on the behalf of Jarold Motto, PA,as directed by  Jarold Motto, PA while in the presence of Jarold Motto, [place].  I, Jarold Motto, [place], have reviewed all documentation for this visit. The documentation on 10/24/23 for the exam, diagnosis, procedures, and orders are all accurate and complete.  I spent a total of 30 minutes on this visit, today 10/24/23, which included reviewing previous notes from gynecology, ordering tests, discussing plan of care with patient and using shared-decision making on next steps, refilling medications, and documenting the findings in the note.   Jarold Motto, PA-C

## 2023-10-25 ENCOUNTER — Ambulatory Visit
Admission: RE | Admit: 2023-10-25 | Discharge: 2023-10-25 | Disposition: A | Discharge status: Home / Self Care | Payer: Medicare Other | Source: Ambulatory Visit | Attending: Physician Assistant | Admitting: Physician Assistant

## 2023-10-25 DIAGNOSIS — R1031 Right lower quadrant pain: Secondary | ICD-10-CM

## 2023-10-25 LAB — COMPREHENSIVE METABOLIC PANEL
ALT: 13 U/L (ref 0–35)
AST: 19 U/L (ref 0–37)
Albumin: 4 g/dL (ref 3.5–5.2)
Alkaline Phosphatase: 76 U/L (ref 39–117)
BUN: 14 mg/dL (ref 6–23)
CO2: 27 meq/L (ref 19–32)
Calcium: 8.8 mg/dL (ref 8.4–10.5)
Chloride: 104 meq/L (ref 96–112)
Creatinine, Ser: 0.73 mg/dL (ref 0.40–1.20)
GFR: 81.03 mL/min (ref >60.00)
Glucose, Bld: 95 mg/dL (ref 70–99)
Potassium: 4.5 meq/L (ref 3.5–5.1)
Sodium: 138 meq/L (ref 135–145)
Total Bilirubin: 0.3 mg/dL (ref 0.2–1.2)
Total Protein: 6.5 g/dL (ref 6.0–8.3)

## 2023-10-25 LAB — CBC WITH DIFFERENTIAL/PLATELET
Basophils Absolute: 0.1 10*3/uL (ref 0.0–0.1)
Basophils Relative: 1.4 % (ref 0.0–3.0)
Eosinophils Absolute: 0.1 10*3/uL (ref 0.0–0.7)
Eosinophils Relative: 1.6 % (ref 0.0–5.0)
HCT: 42.7 % (ref 36.0–46.0)
Hemoglobin: 13.9 g/dL (ref 12.0–15.0)
Lymphocytes Relative: 32.5 % (ref 12.0–46.0)
Lymphs Abs: 1.9 10*3/uL (ref 0.7–4.0)
MCHC: 32.6 g/dL (ref 30.0–36.0)
MCV: 96.8 fL (ref 78.0–100.0)
Monocytes Absolute: 0.6 10*3/uL (ref 0.1–1.0)
Monocytes Relative: 10.3 % (ref 3.0–12.0)
Neutro Abs: 3.2 10*3/uL (ref 1.4–7.7)
Neutrophils Relative %: 54.2 % (ref 43.0–77.0)
Platelets: 218 10*3/uL (ref 150.0–400.0)
RBC: 4.41 Mil/uL (ref 3.87–5.11)
RDW: 13.5 % (ref 11.5–15.5)
WBC: 5.8 10*3/uL (ref 4.0–10.5)

## 2023-10-25 LAB — TSH: TSH: 0.73 u[IU]/mL (ref 0.35–5.50)

## 2023-10-26 ENCOUNTER — Other Ambulatory Visit: Payer: Self-pay | Admitting: Physician Assistant

## 2023-10-26 DIAGNOSIS — R1031 Right lower quadrant pain: Secondary | ICD-10-CM

## 2023-10-31 NOTE — Progress Notes (Unsigned)
   Rubin Payor, PhD, LAT, ATC acting as a scribe for Clementeen Graham, MD.  Candace Frey is a 72 y.o. female who presents to [facility] at [facility] today for R groin pain ongoing for about 2 months. She noticed the pain following a trip to [place]. She notes there is a palpable mass along the R groin and has been evaluated by GYN   Pertinent review of systems: ***  Relevant historical information: ***   Exam:  There were no vitals taken for this visit. General: Well Developed, well nourished, and in no acute distress.   MSK: ***    Lab and Radiology Results No results found for this or any previous visit (from the past 72 hour(s)). No results found.     Assessment and Plan: 72 y.o. female with ***   PDMP not reviewed this encounter. No orders of the defined types were placed in this encounter.  No orders of the defined types were placed in this encounter.    Discussed warning signs or symptoms. Please see discharge instructions. Patient expresses understanding.   ***

## 2023-11-01 ENCOUNTER — Other Ambulatory Visit: Payer: Self-pay

## 2023-11-01 ENCOUNTER — Ambulatory Visit: Payer: Medicare Other | Admitting: Family Medicine

## 2023-11-01 ENCOUNTER — Ambulatory Visit (INDEPENDENT_AMBULATORY_CARE_PROVIDER_SITE_OTHER): Payer: Medicare Other

## 2023-11-01 VITALS — BP 112/74 | HR 83 | Ht 69.0 in | Wt 163.0 lb

## 2023-11-01 DIAGNOSIS — M25551 Pain in right hip: Secondary | ICD-10-CM

## 2023-11-01 DIAGNOSIS — M160 Bilateral primary osteoarthritis of hip: Secondary | ICD-10-CM | POA: Diagnosis not present

## 2023-11-01 DIAGNOSIS — G8929 Other chronic pain: Secondary | ICD-10-CM

## 2023-11-01 DIAGNOSIS — M47816 Spondylosis without myelopathy or radiculopathy, lumbar region: Secondary | ICD-10-CM | POA: Diagnosis not present

## 2023-11-01 NOTE — Patient Instructions (Addendum)
Thank you for coming in today.   Please get an Xray today before you leave   I've referred you to Physical Therapy.  Let us know if you don't hear from them in one week.   Check back in 6 weeks 

## 2023-11-09 NOTE — Therapy (Addendum)
OUTPATIENT PHYSICAL THERAPY LOWER EXTREMITY EVALUATION   Patient Name: Candace Frey MRN: [id number] DOB:09-04-1951, 72 y.o., female Today's Date: 11/14/2023  END OF SESSION:  PT End of Session - 11/17/23 1114     Visit Number 1    Number of Visits 12    Date for PT Re-Evaluation 02/06/24    Authorization Type [employer] auth required- requested on 12/23    PT Start Time 0937    PT Stop Time 1016    PT Time Calculation (min) 39 min    Activity Tolerance Patient tolerated treatment well    Behavior During Therapy [facility] for tasks assessed/performed             Past Medical History:  Diagnosis Date   Acute deep vein thrombosis (DVT) of distal vein of right lower extremity (HCC) 07/22/2016   Acute medial meniscal tear 01/29/2015   Chicken pox    Chronic obstructive lung disease (HCC) 05/23/2019   Colon polyps    Complication of anesthesia    slow to wake up    COPD (chronic obstructive pulmonary disease) (HCC)    Genital warts    Multiple pulmonary nodules determined by computed tomography of lung 07/05/2017   CT 03/03/17  Right lower lobe nodularity described on the prior exam is less apparent today, favored to represent an area of scarring. other tiny nodules described on the prior are not readily apparent. No enlarging or dominant nodule identified > rec continue low dose annual  screening until 2023 per PCP (pt has fm hx lung ca)    Osteopenia    Shortness of breath dyspnea    with exertion was a smoker for many year per patient    Thyroid disease    UTI (urinary tract infection)    Past Surgical History:  Procedure Laterality Date   BREAST BIOPSY     COLONOSCOPY     KNEE ARTHROSCOPY Right 01/29/2015   meniscus repair Procedure: RIGHT KNEE ARTHROSCOPY WITH DEBRIDEMENT  ;  Surgeon: Ollen Gross, MD;  Location: [facility];  Service: Orthopedics;  Laterality: Right;   THYROIDECTOMY, PARTIAL  1998   Patient Active Problem List   Diagnosis Date Noted   Complication of  anesthesia    Hyperlipidemia 06/28/2022   Aortic atherosclerosis (HCC) 06/18/2021   Acquired hypothyroidism 07/17/2020   COPD GOLD I  07/05/2017   History of DVT (deep vein thrombosis) 07/22/2016    PCP: Shelva Majestic, MD  REFERRING PROVIDER: Rodolph Bong, MD  REFERRING DIAG: [phone] (ICD-10-CM) - Chronic right hip pain  THERAPY DIAG:  Pain in right hip  Muscle weakness (generalized)  Difficulty in walking, not elsewhere classified  Rationale for Evaluation and Treatment: Rehabilitation  ONSET DATE: 08/23/23  SUBJECTIVE:   SUBJECTIVE STATEMENT: States she was doing pulmonary rehab because she is an ex smoker and she was doing great. States she went to [place] for 10 days and she flew and stayed in the hospital with her family and was very less mobile. States she didn't notice anything in her body. Stats that she couldn't even take her usually stride and had swelling along the front her her right hip. States that her OB did internal [place]  and didn't see much of anything but swelling. States they then had an external [place] which had no hernia. States today she is doing good today. States that yesterday she could not push up from the stairs.   States that she sometimes has pain along the front of her right  thigh and some times it is along the right side of her shin. States that her gait effects her when her pain is really bad and stride length reduce. States today she feels great.   PERTINENT HISTORY: COPD hyperlipidemia history of DVT, osteopenia, thyroid removal (half remains), PAIN:  Are you having pain? Yes: NPRS scale: 0-10 currently 8 out of 10 at worst Pain location: Right anterior pelvis, front of right thigh, right lateral shin Pain description: Zinging sharp Aggravating factors: Upstairs, walking Relieving factors: Rest  PRECAUTIONS: None  RED FLAGS: None   WEIGHT BEARING RESTRICTIONS: No  FALLS:  Has patient fallen in last 6 months? No    PLOF:  Independent  PATIENT GOALS: To have less pain and to be able to return to pulmonary rehab without limitations or pain  OBJECTIVE:  Note: Objective measures were completed at Evaluation unless otherwise noted.  DIAGNOSTIC FINDINGS:  10/25/23 US pelvis FINDINGS: No distinct masses are identified within the right groin, area of concern. No sonographic evidence of hernia. No focal fluid collections are demonstrated. No sonographic abnormalities are detected.   IMPRESSION: 1. Unremarkable ultrasound of the right groin. No sonographic evidence of hernia.  PATIENT SURVEYS:  FOTO 42%  COGNITION: Overall cognitive status: Within functional limits for tasks assessed     SENSATION: Not tested  EDEMA:  Circumferential: At mid calf 26 cm left 28 cm right    POSTURE: rounded shoulders and flexed trunk   PALPATION: Severe tenderness noted on along right femoral triangle with palpable swelling noted    LE Measurements no increase in pain symptoms noted with supine hip range of motion in all directions   LOWER EXTREMITY SPECIAL TESTS:  Negative straight leg raise bilaterally       TODAY'S TREATMENT:                                                                                                                              DATE:   11/14/2023  Therapeutic Exercise:  Aerobic: Supine: Prone:  Seated:  Standing: Neuromuscular Re-education: Manual Therapy: Lymph massage to right lower extremity 8 minutes Therapeutic Activity: Self Care: Education in self lymph massage, diaphragmatic breathing, anatomy for self lymph massage and rationale behind interventions Trigger Point Dry Needling:  Modalities:    PATIENT EDUCATION:  Education details: on current presentation, on HEP, on clinical outcomes score and POC Person educated: Patient Education method: Programmer, multimedia, Demonstration, and Handouts Education comprehension: verbalized understanding   HOME EXERCISE  PROGRAM: No formal program at this time  ASSESSMENT:  CLINICAL IMPRESSION: Patient presents to physical therapy with complaints of right anterior hip pain that started a couple months ago.  Patient presents with palpable swelling along this region.  Responded well to lymph massage and patient educated in self limb massage to help manage swelling in this area.  Patient with notable swelling, and functional deficits that are contributing to reduced quality of life.  Patient greatly benefit from skilled PT  to address physical impairments and improve overall function at home and in the community.  OBJECTIVE IMPAIRMENTS: decreased activity tolerance, increased edema, improper body mechanics, postural dysfunction, and pain.   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   REHAB POTENTIAL: Good  CLINICAL DECISION MAKING: Stable/uncomplicated  EVALUATION COMPLEXITY: Low   GOALS: Goals reviewed with patient? yes  SHORT TERM GOALS: Target date: 12/26/2023 Patient will be independent in self management strategies to improve quality of life and functional outcomes. Baseline: New Program Goal status: INITIAL  2.  Patient will report at least 50% improvement in overall symptoms and/or function to demonstrate improved functional mobility Baseline: 0% better Goal status: INITIAL  3.  Patient will report performing daily lymph massage to help with edema management Baseline: Not currently Goal status: INITIAL     LONG TERM GOALS: Target date: 02/06/2024  Patient will report at least 75% improvement in overall symptoms and/or function to demonstrate improved functional mobility Baseline: 0% better Goal status: INITIAL  2.  Patient will improve score on FOTO outcomes measure to projected score  to demonstrate overall improved function and QOL Baseline: see above Goal status: INITIAL  3.  Patient will be able to go up and down stairs without pain to improve community function Baseline: Painful at times Goal status: INITIAL    PLAN:  PT FREQUENCY: 1-2x/week for total of 12 visits over 12 weeks certification period  PT DURATION: 12 weeks  PLANNED INTERVENTIONS: 97110-Therapeutic exercises, 97530- Therapeutic activity, 97112- Neuromuscular re-education, 97535- Self Care, 84696- Manual therapy, [phone]- Gait training, [phone]- Orthotic Fit/training, [phone]- Canalith repositioning, [id number]- Aquatic Therapy, [id number]- Electrical stimulation (unattended), [phone]- Ionotophoresis 4mg /ml Dexamethasone, Patient/Family education, Balance training, Stair training, Taping, Dry Needling, Joint mobilization, Joint manipulation, Spinal manipulation, Spinal mobilization, Cryotherapy, and Moist heat   PLAN FOR NEXT SESSION: Lymph management, right hip range of motion and strength assess, leg length assess   9:36 AM, 11/14/23 Tereasa Coop, DPT Physical Therapy with [facility]

## 2023-11-14 ENCOUNTER — Encounter: Payer: Self-pay | Admitting: Physical Therapy

## 2023-11-14 ENCOUNTER — Ambulatory Visit: Payer: Medicare Other | Admitting: Physical Therapy

## 2023-11-14 DIAGNOSIS — R262 Difficulty in walking, not elsewhere classified: Secondary | ICD-10-CM

## 2023-11-14 DIAGNOSIS — M6281 Muscle weakness (generalized): Secondary | ICD-10-CM

## 2023-11-14 DIAGNOSIS — M25551 Pain in right hip: Secondary | ICD-10-CM

## 2023-11-21 ENCOUNTER — Encounter: Payer: Self-pay | Admitting: Physical Therapy

## 2023-11-21 ENCOUNTER — Ambulatory Visit: Payer: Medicare Other | Admitting: Physical Therapy

## 2023-11-21 DIAGNOSIS — M6281 Muscle weakness (generalized): Secondary | ICD-10-CM

## 2023-11-21 DIAGNOSIS — R262 Difficulty in walking, not elsewhere classified: Secondary | ICD-10-CM

## 2023-11-21 DIAGNOSIS — M25551 Pain in right hip: Secondary | ICD-10-CM | POA: Diagnosis not present

## 2023-11-21 NOTE — Progress Notes (Signed)
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

## 2023-11-21 NOTE — Therapy (Signed)
OUTPATIENT PHYSICAL THERAPY LOWER EXTREMITY TREATMENT   Patient Name: Candace Frey MRN: [id number] DOB:1951-11-15, 72 y.o., female Today's Date: 11/21/2023  END OF SESSION:  PT End of Session - 11/21/23 1349     Visit Number 2    Number of Visits 12    Date for PT Re-Evaluation 02/06/24    Authorization Type UHC auth required- requested on 12/23    PT Start Time 1350    PT Stop Time 1430    PT Time Calculation (min) 40 min    Activity Tolerance Patient tolerated treatment well    Behavior During Therapy [facility] for tasks assessed/performed              Past Medical History:  Diagnosis Date   Acute deep vein thrombosis (DVT) of distal vein of right lower extremity (HCC) 07/22/2016   Acute medial meniscal tear 01/29/2015   Chicken pox    Chronic obstructive lung disease (HCC) 05/23/2019   Colon polyps    Complication of anesthesia    slow to wake up    COPD (chronic obstructive pulmonary disease) (HCC)    Genital warts    Multiple pulmonary nodules determined by computed tomography of lung 07/05/2017   CT 03/03/17  Right lower lobe nodularity described on the prior exam is less apparent today, favored to represent an area of scarring. other tiny nodules described on the prior are not readily apparent. No enlarging or dominant nodule identified > rec continue low dose annual  screening until 2023 per PCP (pt has fm hx lung ca)    Osteopenia    Shortness of breath dyspnea    with exertion was a smoker for many year per patient    Thyroid disease    UTI (urinary tract infection)    Past Surgical History:  Procedure Laterality Date   BREAST BIOPSY     COLONOSCOPY     KNEE ARTHROSCOPY Right 01/29/2015   meniscus repair Procedure: RIGHT KNEE ARTHROSCOPY WITH DEBRIDEMENT  ;  Surgeon: Ollen Gross, MD;  Location: [facility];  Service: Orthopedics;  Laterality: Right;   THYROIDECTOMY, PARTIAL  1998   Patient Active Problem List   Diagnosis Date Noted   Complication of  anesthesia    Hyperlipidemia 06/28/2022   Aortic atherosclerosis (HCC) 06/18/2021   Acquired hypothyroidism 07/17/2020   COPD GOLD I  07/05/2017   History of DVT (deep vein thrombosis) 07/22/2016    PCP: Shelva Majestic, MD  REFERRING PROVIDER: Rodolph Bong, MD  REFERRING DIAG: [phone] (ICD-10-CM) - Chronic right hip pain  THERAPY DIAG:  Pain in right hip  Muscle weakness (generalized)  Difficulty in walking, not elsewhere classified  Rationale for Evaluation and Treatment: Rehabilitation  ONSET DATE: 08/23/23  SUBJECTIVE:   SUBJECTIVE STATEMENT: 11/21/2023 States that she feels much better today. Still having some rigidity and feels like she has to get her hip in "alignment" when she starts walking. States she can take a regular step now with walking.    Eval: States she was doing pulmonary rehab because she is an ex smoker and she was doing great. States she went to [place] for 10 days and she flew and stayed in the hospital with her family and was very less mobile. States she didn't notice anything in her body. Stats that she couldn't even take her usually stride and had swelling along the front her her right hip. States that her OB did internal [place]  and didn't see much of anything but swelling. States  they then had an external [place] which had no hernia. States today she is doing good today. States that yesterday she could not push up from the stairs.   States that she sometimes has pain along the front of her right thigh and some times it is along the right side of her shin. States that her gait effects her when her pain is really bad and stride length reduce. States today she feels great.   PERTINENT HISTORY: COPD hyperlipidemia history of DVT, osteopenia, thyroid removal (half remains), PAIN:  Are you having pain? Yes: NPRS scale: 0-1/10  Pain location: Right anterior pelvis, front of right thigh, right lateral shin Pain description: Zinging sharp Aggravating factors:  Upstairs, walking Relieving factors: Rest  PRECAUTIONS: None  RED FLAGS: None   WEIGHT BEARING RESTRICTIONS: No  FALLS:  Has patient fallen in last 6 months? No    PLOF: Independent  PATIENT GOALS: To have less pain and to be able to return to pulmonary rehab without limitations or pain  OBJECTIVE:  Note: Objective measures were completed at Evaluation unless otherwise noted.  DIAGNOSTIC FINDINGS:  10/25/23 US pelvis FINDINGS: No distinct masses are identified within the right groin, area of concern. No sonographic evidence of hernia. No focal fluid collections are demonstrated. No sonographic abnormalities are detected.   IMPRESSION: 1. Unremarkable ultrasound of the right groin. No sonographic evidence of hernia.  PATIENT SURVEYS:  FOTO 42%  COGNITION: Overall cognitive status: Within functional limits for tasks assessed     SENSATION: Not tested  EDEMA:  Circumferential: At mid calf 26 cm left 28 cm right    POSTURE: rounded shoulders and flexed trunk   PALPATION: Severe tenderness noted on along right femoral triangle with palpable swelling noted        LE Measurements Lower Extremity Right 11/21/2023 Left 11/21/2023   A/PROM MMT A/PROM MMT  Hip Flexion      Hip Extension      Hip Abduction  4-  3+  Hip Adduction      Hip Internal rotation 45  30   Hip External rotation 65  65   Knee Flexion      Knee Extension      Ankle Dorsiflexion      Ankle Plantarflexion      Ankle Inversion      Ankle Eversion       (Blank rows = not tested) * pain    LOWER EXTREMITY SPECIAL TESTS:  Negative straight leg raise bilaterally Special tests: R leg equal to longer with long sitting Ely's test negative bilaterally       TODAY'S TREATMENT:                                                                                                                              DATE:   11/21/2023  Therapeutic Exercise:  Objective measurements  updated Supine: Bridges 2 x 12 5-second holds with glute activation, SIJ isometrics hip  Abd/add/flexion/extension 15 minutes total, hip internal rotation and external rotation stretching 6 minutes, walking 5 minutes Prone:  Seated:  Standing: Neuromuscular Re-education: Manual Therapy:  Therapeutic Activity: Self Care: Discussed lymph/edema management strategies with different brushes 8 minutes Trigger Point Dry Needling:  Modalities:    PATIENT EDUCATION:  Education details: on current presentation, leg length findings as well as strength findings.  On rationale behind interventions, reviewed and discussed symptom management strategies with different brushes Person educated: Patient Education method: Explanation, Demonstration, and Handouts Education comprehension: verbalized understanding   HOME EXERCISE PROGRAM: No formal program at this time  ASSESSMENT:  CLINICAL IMPRESSION: 11/21/2023 Session focused on updating objective measurements and assessing range of motion and strength in lower extremities.  Right leg length was found to be functionally longer on her right side.  This improved with sacroiliac joint isometrics.  Tolerated interventions well reporting significantly less tension and out of place feeling end of session.  Overall patient doing well and would continue to benefit from skilled PT at this time.  Eval:Patient presents to physical therapy with complaints of right anterior hip pain that started a couple months ago.  Patient presents with palpable swelling along this region.  Responded well to lymph massage and patient educated in self limb massage to help manage swelling in this area.  Patient with notable swelling, and functional deficits that are contributing to reduced quality of life.  Patient greatly benefit from skilled PT to address physical impairments and improve overall function at home and in the community.  OBJECTIVE IMPAIRMENTS: decreased activity  tolerance, increased edema, improper body mechanics, postural dysfunction, and pain.   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   REHAB POTENTIAL: Good  CLINICAL DECISION MAKING: Stable/uncomplicated  EVALUATION COMPLEXITY: Low   GOALS: Goals reviewed with patient? yes  SHORT TERM GOALS: Target date: 12/26/2023 Patient will be independent in self management strategies to improve quality of life and functional outcomes. Baseline: New Program Goal status: INITIAL  2.  Patient will report at least 50% improvement in overall symptoms and/or function to demonstrate improved functional mobility Baseline: 0% better Goal status: INITIAL  3.  Patient will report performing daily lymph massage to help with edema management Baseline: Not currently Goal status: INITIAL     LONG TERM GOALS: Target date: 02/06/2024  Patient will report at least 75% improvement in overall symptoms and/or function to demonstrate improved functional mobility Baseline: 0% better Goal status: INITIAL  2.  Patient will improve score on FOTO outcomes measure to projected score to demonstrate overall improved function and QOL Baseline: see above Goal status: INITIAL  3.  Patient will be able to go up and down stairs without pain to improve community function Baseline: Painful at times Goal status: INITIAL    PLAN:  PT FREQUENCY: 1-2x/week for total of 12 visits over 12 weeks certification period  PT DURATION: 12 weeks  PLANNED INTERVENTIONS: 97110-Therapeutic exercises, 97530- Therapeutic activity, 97112- Neuromuscular re-education, 97535- Self Care, [id number]- Manual therapy, [id number]- Gait training, [phone]- Orthotic Fit/training, [phone]- Canalith repositioning, [id number]- Aquatic Therapy, 97014- Electrical  stimulation (unattended), [phone]- Ionotophoresis 4mg /ml Dexamethasone, Patient/Family education, Balance training, Stair training, Taping, Dry Needling, Joint mobilization, Joint manipulation, Spinal manipulation, Spinal mobilization, Cryotherapy, and Moist heat   PLAN FOR NEXT SESSION: Left sidebending for trunk  Lymph management, right hip range of motion and strength assess,  leg length assess   2:53 PM, 11/21/23 Tereasa Coop, DPT Physical Therapy with [facility]

## 2023-11-22 DIAGNOSIS — M8588 Other specified disorders of bone density and structure, other site: Secondary | ICD-10-CM | POA: Diagnosis not present

## 2023-11-29 ENCOUNTER — Ambulatory Visit: Payer: Medicare Other | Admitting: Physical Therapy

## 2023-11-29 ENCOUNTER — Encounter: Payer: Self-pay | Admitting: Physical Therapy

## 2023-11-29 DIAGNOSIS — M6281 Muscle weakness (generalized): Secondary | ICD-10-CM

## 2023-11-29 DIAGNOSIS — R262 Difficulty in walking, not elsewhere classified: Secondary | ICD-10-CM | POA: Diagnosis not present

## 2023-11-29 DIAGNOSIS — M25551 Pain in right hip: Secondary | ICD-10-CM | POA: Diagnosis not present

## 2023-11-29 NOTE — Therapy (Signed)
 OUTPATIENT PHYSICAL THERAPY LOWER EXTREMITY TREATMENT   Patient Name: Candace Frey MRN: [id number] DOB:[birth year], 73 y.o., female Today's Date: 11/29/2023  END OF SESSION:  PT End of Session - 11/29/23 1348     Visit Number 3    Number of Visits 12    Date for PT Re-Evaluation 02/06/24    Authorization Type UHC auth required-  11/14/2023 - 02/06/2024 12 visits approved    Authorization - Visit Number 3    Authorization - Number of Visits 12    PT Start Time 1349    PT Stop Time 1429    PT Time Calculation (min) 40 min    Activity Tolerance Patient tolerated treatment well    Behavior During Therapy [facility] for tasks assessed/performed              Past Medical History:  Diagnosis Date   Acute deep vein thrombosis (DVT) of distal vein of right lower extremity (HCC) 07/22/2016   Acute medial meniscal tear 01/29/2015   Chicken pox    Chronic obstructive lung disease (HCC) 05/23/2019   Colon polyps    Complication of anesthesia    slow to wake up    COPD (chronic obstructive pulmonary disease) (HCC)    Genital warts    Multiple pulmonary nodules determined by computed tomography of lung 07/05/2017   CT 03/03/17  Right lower lobe nodularity described on the prior exam is less apparent today, favored to represent an area of scarring. other tiny nodules described on the prior are not readily apparent. No enlarging or dominant nodule identified > rec continue low dose annual  screening until 2023 per PCP (pt has fm hx lung ca)    Osteopenia    Shortness of breath dyspnea    with exertion was a smoker for many year per patient    Thyroid  disease    UTI (urinary tract infection)    Past Surgical History:  Procedure Laterality Date   BREAST BIOPSY     COLONOSCOPY     KNEE ARTHROSCOPY Right 01/29/2015   meniscus repair Procedure: RIGHT KNEE ARTHROSCOPY WITH DEBRIDEMENT  ;  Surgeon: Dempsey Moan, MD;  Location: [facility];  Service: Orthopedics;  Laterality: Right;    THYROIDECTOMY, PARTIAL  1998   Patient Active Problem List   Diagnosis Date Noted   Complication of anesthesia    Hyperlipidemia 06/28/2022   Aortic atherosclerosis (HCC) 06/18/2021   Acquired hypothyroidism 07/17/2020   COPD GOLD I  07/05/2017   History of DVT (deep vein thrombosis) 07/22/2016    PCP: Katrinka Garnette KIDD, MD  REFERRING PROVIDER: Joane Artist RAMAN, MD  REFERRING DIAG: [phone] (ICD-10-CM) - Chronic right hip pain  THERAPY DIAG:  Pain in right hip  Muscle weakness (generalized)  Difficulty in walking, not elsewhere classified  Rationale for Evaluation and Treatment: Rehabilitation  ONSET DATE: 08/23/23  SUBJECTIVE:   SUBJECTIVE STATEMENT: 11/29/2023 States she can feel the difference and she can walk better. States she is still a little stiff at time. States that even with the stiffness she is able to walk It off.    Eval: States she was doing pulmonary rehab because she is an ex smoker and she was doing great. States she went to [place] for 10 days and she flew and stayed in the hospital with her family and was very less mobile. States she didn't notice anything in her body. Stats that she couldn't even take her usually stride and had swelling along the front her her right  hip. States that her OB did internal US   and didn't see much of anything but swelling. States they then had an external US  which had no hernia. States today she is doing good today. States that yesterday she could not push up from the stairs.   States that she sometimes has pain along the front of her right thigh and some times it is along the right side of her shin. States that her gait effects her when her pain is really bad and stride length reduce. States today she feels great.   PERTINENT HISTORY: COPD hyperlipidemia history of DVT, osteopenia, thyroid  removal (half remains), PAIN:  Are you having pain? Yes: NPRS scale: 4/10  Pain location: Right anterior pelvis, front of right thigh,  right lateral shin Pain description: stiffness Aggravating factors: Upstairs, walking Relieving factors: Rest  PRECAUTIONS: None  RED FLAGS: None   WEIGHT BEARING RESTRICTIONS: No  FALLS:  Has patient fallen in last 6 months? No    PLOF: Independent  PATIENT GOALS: To have less pain and to be able to return to pulmonary rehab without limitations or pain  OBJECTIVE:  Note: Objective measures were completed at Evaluation unless otherwise noted.  DIAGNOSTIC FINDINGS:  10/25/23 US  pelvis FINDINGS: No distinct masses are identified within the right groin, area of concern. No sonographic evidence of hernia. No focal fluid collections are demonstrated. No sonographic abnormalities are detected.   IMPRESSION: 1. Unremarkable ultrasound of the right groin. No sonographic evidence of hernia.  PATIENT SURVEYS:  FOTO 42%  COGNITION: Overall cognitive status: Within functional limits for tasks assessed     SENSATION: Not tested  EDEMA:  Circumferential: At mid calf 26 cm left 28 cm right    POSTURE: rounded shoulders and flexed trunk   PALPATION: Severe tenderness noted on along right femoral triangle with palpable swelling noted        LE Measurements Lower Extremity Right 11/21/2023 Left 11/21/2023   A/PROM MMT A/PROM MMT  Hip Flexion      Hip Extension      Hip Abduction  4-  3+  Hip Adduction      Hip Internal rotation 45  30   Hip External rotation 65  65   Knee Flexion      Knee Extension      Ankle Dorsiflexion      Ankle Plantarflexion      Ankle Inversion      Ankle Eversion       (Blank rows = not tested) * pain    LOWER EXTREMITY SPECIAL TESTS:  Negative straight leg raise bilaterally Special tests: R leg equal to longer with long sitting Ely's test negative bilaterally       TODAY'S TREATMENT:                                                                                                                              DATE:    11/29/2023  Therapeutic Exercise:  Supine: review of all exercise sand rationale, anatomy on breathing an how that will help. 10 minutes Prone:  Seated:  Standing: Neuromuscular Re-education:360 breathing in supine with and without arm protraction --> trailed in seated [position - 15 minutes, prone belly breathing and supine posterolateral rib breathing 15 minutes Manual Therapy:  Therapeutic Activity: Self Care: D  Trigger Point Dry Needling:  Modalities:    PATIENT EDUCATION:  Education details: on HEP Person educated: Patient Education method: Explanation, Facilities Manager, and Handouts Education comprehension: verbalized understanding   HOME EXERCISE PROGRAM: [id number]  ASSESSMENT:  CLINICAL IMPRESSION: 11/29/2023 Session focused on breathing exercises as well as education on anatomy and rationale behind these interventions.  Very difficult for patient to perform posterior rib expansion as she is predominantly anterior chest and belly breather.  This improved with transitioning to prone position.  Sitting upright with the breathing was still very challenging for patient.  Reduce stiffness noted end of session after breath work and added new exercises to home program.  Will continue with current plan of care as tolerated.  Eval:Patient presents to physical therapy with complaints of right anterior hip pain that started a couple months ago.  Patient presents with palpable swelling along this region.  Responded well to lymph massage and patient educated in self limb massage to help manage swelling in this area.  Patient with notable swelling, and functional deficits that are contributing to reduced quality of life.  Patient greatly benefit from skilled PT to address physical impairments and improve overall function at home and in the community.  OBJECTIVE IMPAIRMENTS: decreased activity tolerance, increased edema, improper body mechanics, postural dysfunction, and pain.   ACTIVITY  LIMITATIONS: stairs, transfers, and locomotion level  PARTICIPATION LIMITATIONS: laundry and community activity  PERSONAL FACTORS: 1-2 comorbidities: History of DVT, 1/2 thyroid  removed  are also affecting patient's functional outcome.   REHAB POTENTIAL: Good  CLINICAL DECISION MAKING: Stable/uncomplicated  EVALUATION COMPLEXITY: Low   GOALS: Goals reviewed with patient? yes  SHORT TERM GOALS: Target date: 12/26/2023 Patient will be independent in self management strategies to improve quality of life and functional outcomes. Baseline: New Program Goal status: INITIAL  2.  Patient will report at least 50% improvement in overall symptoms and/or function to demonstrate improved functional mobility Baseline: 0% better Goal status: INITIAL  3.  Patient will report performing daily lymph massage to help with edema management Baseline: Not currently Goal status: INITIAL     LONG TERM GOALS: Target date: 02/06/2024  Patient will report at least 75% improvement in overall symptoms and/or function to demonstrate improved functional mobility Baseline: 0% better Goal status: INITIAL  2.  Patient will improve score on FOTO outcomes measure to projected score to demonstrate overall improved function and QOL Baseline: see above Goal status: INITIAL  3.  Patient will be able to go up and down stairs without pain to improve community function Baseline: Painful at times Goal status: INITIAL    PLAN:  PT FREQUENCY: 1-2x/week for total of 12 visits over 12 weeks certification period  PT DURATION: 12 weeks  PLANNED INTERVENTIONS: 97110-Therapeutic exercises, 97530- Therapeutic activity, [id number]- Neuromuscular re-education, 97535- Self Care, [id number]- Manual therapy, [id number]- Gait training, [phone]- Orthotic Fit/training, [phone]- Canalith repositioning, [id number]- Aquatic Therapy, 97014- Electrical stimulation (unattended), [phone]- Ionotophoresis 4mg /ml Dexamethasone , Patient/Family education, Balance  training, Stair training, Taping, Dry Needling, Joint mobilization, Joint manipulation, Spinal manipulation, Spinal mobilization, Cryotherapy, and Moist heat   PLAN FOR NEXT SESSION: Left sidebending for trunk  Lymph management, right hip range  of motion and strength assess, leg length assess   3:42 PM, 11/29/23 Olivia Church, DPT Physical Therapy with [facility]

## 2023-12-06 ENCOUNTER — Ambulatory Visit: Payer: Medicare Other | Admitting: Physical Therapy

## 2023-12-06 ENCOUNTER — Encounter: Payer: Self-pay | Admitting: Physical Therapy

## 2023-12-06 DIAGNOSIS — M25551 Pain in right hip: Secondary | ICD-10-CM

## 2023-12-06 DIAGNOSIS — M6281 Muscle weakness (generalized): Secondary | ICD-10-CM | POA: Diagnosis not present

## 2023-12-06 DIAGNOSIS — R262 Difficulty in walking, not elsewhere classified: Secondary | ICD-10-CM

## 2023-12-06 NOTE — Therapy (Signed)
 OUTPATIENT PHYSICAL THERAPY LOWER EXTREMITY TREATMENT   Patient Name: Candace Frey MRN: [id number] DOB:1951/10/23, 73 y.o., female Today's Date: 12/06/2023  END OF SESSION:  PT End of Session - 12/06/23 1349     Visit Number 4    Number of Visits 12    Date for PT Re-Evaluation 02/06/24    Authorization Type UHC auth required-  11/14/2023 - 02/06/2024 12 visits approved    Authorization - Visit Number 4    Authorization - Number of Visits 12    PT Start Time 1350    PT Stop Time 1430    PT Time Calculation (min) 40 min    Activity Tolerance Patient tolerated treatment well    Behavior During Therapy [facility] for tasks assessed/performed              Past Medical History:  Diagnosis Date   Acute deep vein thrombosis (DVT) of distal vein of right lower extremity (HCC) 07/22/2016   Acute medial meniscal tear 01/29/2015   Chicken pox    Chronic obstructive lung disease (HCC) 05/23/2019   Colon polyps    Complication of anesthesia    slow to wake up    COPD (chronic obstructive pulmonary disease) (HCC)    Genital warts    Multiple pulmonary nodules determined by computed tomography of lung 07/05/2017   CT 03/03/17  Right lower lobe nodularity described on the prior exam is less apparent today, favored to represent an area of scarring. other tiny nodules described on the prior are not readily apparent. No enlarging or dominant nodule identified > rec continue low dose annual  screening until 2023 per PCP (pt has fm hx lung ca)    Osteopenia    Shortness of breath dyspnea    with exertion was a smoker for many year per patient    Thyroid  disease    UTI (urinary tract infection)    Past Surgical History:  Procedure Laterality Date   BREAST BIOPSY     COLONOSCOPY     KNEE ARTHROSCOPY Right 01/29/2015   meniscus repair Procedure: RIGHT KNEE ARTHROSCOPY WITH DEBRIDEMENT  ;  Surgeon: Dempsey Moan, MD;  Location: [facility];  Service: Orthopedics;  Laterality: Right;    THYROIDECTOMY, PARTIAL  1998   Patient Active Problem List   Diagnosis Date Noted   Complication of anesthesia    Hyperlipidemia 06/28/2022   Aortic atherosclerosis (HCC) 06/18/2021   Acquired hypothyroidism 07/17/2020   COPD GOLD I  07/05/2017   History of DVT (deep vein thrombosis) 07/22/2016    PCP: Katrinka Garnette KIDD, MD  REFERRING PROVIDER: Joane Artist RAMAN, MD  REFERRING DIAG: [phone] (ICD-10-CM) - Chronic right hip pain  THERAPY DIAG:  Pain in right hip  Muscle weakness (generalized)  Difficulty in walking, not elsewhere classified  Rationale for Evaluation and Treatment: Rehabilitation  ONSET DATE: 08/23/23  SUBJECTIVE:   SUBJECTIVE STATEMENT: 12/06/2023 States that today it has been bad and she did the dry brushing. Been working on her breathing. Reports she sat for 2 hours yesterday evening.   Eval: States she was doing pulmonary rehab because she is an ex smoker and she was doing great. States she went to [place] 10 [place] and she flew and stayed in the hospital with her family and was very less mobile. States she didn't notice anything in her body. Stats that she couldn't even take her usually stride and had swelling along the front her her right hip. States that her OB did internal US   and didn't see much of anything but swelling. States they then had an external US  which had no hernia. States today she is doing good today. States that yesterday she could not push up from the stairs.   States that she sometimes has pain along the front of her right thigh and some times it is along the right side of her shin. States that her gait effects her when her pain is really bad and stride length reduce. States today she feels great.   PERTINENT HISTORY: COPD hyperlipidemia history of DVT, osteopenia, thyroid  removal (half remains), PAIN:  Are you having pain? Yes: NPRS scale: 4/10  Pain location: Right anterior pelvis, front of right thigh, right lateral shin Pain  description: stiffness Aggravating factors: Upstairs, walking Relieving factors: Rest  PRECAUTIONS: None  RED FLAGS: None   WEIGHT BEARING RESTRICTIONS: No  FALLS:  Has patient fallen in last 6 months? No    PLOF: Independent  PATIENT GOALS: To have less pain and to be able to return to pulmonary rehab without limitations or pain  OBJECTIVE:  Note: Objective measures were completed at Evaluation unless otherwise noted.  DIAGNOSTIC FINDINGS:  10/25/23 US  pelvis FINDINGS: No distinct masses are identified within the right groin, area of concern. No sonographic evidence of hernia. No focal fluid collections are demonstrated. No sonographic abnormalities are detected.   IMPRESSION: 1. Unremarkable ultrasound of the right groin. No sonographic evidence of hernia.  PATIENT SURVEYS:  FOTO 42%  COGNITION: Overall cognitive status: Within functional limits for tasks assessed     SENSATION: Not tested  EDEMA:  Circumferential: At mid calf 26 cm left 28 cm right    POSTURE: rounded shoulders and flexed trunk   PALPATION: Severe tenderness noted on along right femoral triangle with palpable swelling noted        LE Measurements Lower Extremity Right 11/21/2023 Left 11/21/2023   A/PROM MMT A/PROM MMT  Hip Flexion      Hip Extension      Hip Abduction  4-  3+  Hip Adduction      Hip Internal rotation 45  30   Hip External rotation 65  65   Knee Flexion      Knee Extension      Ankle Dorsiflexion      Ankle Plantarflexion      Ankle Inversion      Ankle Eversion       (Blank rows = not tested) * pain    LOWER EXTREMITY SPECIAL TESTS:  Negative straight leg raise bilaterally Special tests: R leg equal to longer with long sitting Ely's test negative bilaterally       TODAY'S TREATMENT:                                                                                                                              DATE:   12/06/2023  Therapeutic  Exercise: See education below 15 minutes Prone:  Seated:  Standing: Neuromuscular Re-education:  Manual Therapy: STM to right hip for lymph massage 10 minutes Therapeutic Activity: Self Care: educated and measured patient in compression garments, different sizes and types- 15 minutes Trigger Point Dry Needling:  Modalities:    PATIENT EDUCATION:  Education details: on HEP, on different types of compression garments, measurements, on rationale behind interventions, how to self massage and what to do- ankle pumps/heel slides prior to getting up out of bed Person educated: Patient Education method: Explanation, Demonstration, and Handouts Education comprehension: verbalized understanding   HOME EXERCISE PROGRAM: [id number]  ASSESSMENT:  CLINICAL IMPRESSION: 12/06/2023 Focused on education and measuring for compression garments. Increased proximal swelling in right leg but this resolved after ,manual work. Answered all questions and provided handout for compression garment outlet. Overall patient doing well and will continue to benefit form skilled PT at this time.   Eval:Patient presents to physical therapy with complaints of right anterior hip pain that started a couple months ago.  Patient presents with palpable swelling along this region.  Responded well to lymph massage and patient educated in self limb massage to help manage swelling in this area.  Patient with notable swelling, and functional deficits that are contributing to reduced quality of life.  Patient greatly benefit from skilled PT to address physical impairments and improve overall function at home and in the community.  OBJECTIVE IMPAIRMENTS: decreased activity tolerance, increased edema, improper body mechanics, postural dysfunction, and pain.   ACTIVITY LIMITATIONS: stairs, transfers, and locomotion level  PARTICIPATION LIMITATIONS: laundry and community activity  PERSONAL FACTORS: 1-2 comorbidities: History of DVT,  1/2 thyroid  removed  are also affecting patient's functional outcome.   REHAB POTENTIAL: Good  CLINICAL DECISION MAKING: Stable/uncomplicated  EVALUATION COMPLEXITY: Low   GOALS: Goals reviewed with patient? yes  SHORT TERM GOALS: Target date: 12/26/2023 Patient will be independent in self management strategies to improve quality of life and functional outcomes. Baseline: New Program Goal status: INITIAL  2.  Patient will report at least 50% improvement in overall symptoms and/or function to demonstrate improved functional mobility Baseline: 0% better Goal status: INITIAL  3.  Patient will report performing daily lymph massage to help with edema management Baseline: Not currently Goal status: INITIAL     LONG TERM GOALS: Target date: 02/06/2024  Patient will report at least 75% improvement in overall symptoms and/or function to demonstrate improved functional mobility Baseline: 0% better Goal status: INITIAL  2.  Patient will improve score on FOTO outcomes measure to projected score to demonstrate overall improved function and QOL Baseline: see above Goal status: INITIAL  3.  Patient will be able to go up and down stairs without pain to improve community function Baseline: Painful at times Goal status: INITIAL    PLAN:  PT FREQUENCY: 1-2x/week for total of 12 visits over 12 weeks certification period  PT DURATION: 12 weeks  PLANNED INTERVENTIONS: 97110-Therapeutic exercises, 97530- Therapeutic activity, 97112- Neuromuscular re-education, 97535- Self Care, 02859- Manual therapy, [phone]- Gait training, [phone]- Orthotic Fit/training, [phone]- Canalith repositioning, [id number]- Aquatic Therapy, 97014- Electrical stimulation (unattended), [phone]- Ionotophoresis 4mg /ml Dexamethasone , Patient/Family education, Balance training, Stair training, Taping, Dry Needling, Joint mobilization, Joint manipulation, Spinal manipulation, Spinal mobilization, Cryotherapy, and Moist heat   PLAN  FOR NEXT SESSION: Left sidebending for trunk  Lymph management, right hip range of motion and strength assess, leg length assess   2:54 PM, 12/06/23 Olivia Church, DPT Physical Therapy with [place]

## 2023-12-08 ENCOUNTER — Other Ambulatory Visit: Payer: Self-pay | Admitting: Family Medicine

## 2023-12-08 MED ORDER — LEVOTHYROXINE SODIUM 88 MCG PO TABS: 88.0000 ug | ORAL_TABLET | Freq: Every day | ORAL | 3 refills | Status: DC

## 2023-12-08 NOTE — Telephone Encounter (Signed)
Wrong office

## 2023-12-08 NOTE — Telephone Encounter (Signed)
Copied from CRM [id number]. Topic: Clinical - Medication Refill >> Dec 08, 2023 11:36 AM Lennart Pall wrote: Most Recent Primary Care Visit:  Provider: Jarold Motto  Department: [facility]  Visit Type: OFFICE VISIT  Date: 10/24/2023  Medication: ***  Has the patient contacted their pharmacy?  (Agent: If no, request that the patient contact the pharmacy for the refill. If patient does not wish to contact the pharmacy document the reason why and proceed with request.) (Agent: If yes, when and what did the pharmacy advise?)  Is this the correct pharmacy for this prescription?  If no, delete pharmacy and type the correct one.  This is the patient's preferred pharmacy:  [facility]) - [place], [place] - [address] [facility] [address] [place] [address] [place] [address] Phone: [phone] Fax: [phone]  [id number] [place], [place] - [address] AT [facility] [address] [place] [address] Phone: [phone] Fax: [phone]  [facility] - [place], [place] - [address] [place] [address] Phone: [phone] Fax: [phone]  [facility] [address] [place] [address] Phone: [phone] Fax: [phone]  [facility] [id number] [place], [place] - [address] [address] Phone: [phone] Fax: [phone]   Has the prescription been filled recently?   Is the patient out of the medication?   Has the patient been seen for an appointment in the last year OR does the patient have an upcoming appointment?   Can we respond through MyChart?   Agent: Please be advised that Rx refills may take up to 3 business days. We ask that you follow-up with your pharmacy.

## 2023-12-13 ENCOUNTER — Ambulatory Visit: Payer: Medicare Other | Admitting: Family Medicine

## 2023-12-13 ENCOUNTER — Encounter: Payer: Self-pay | Admitting: Physical Therapy

## 2023-12-13 ENCOUNTER — Ambulatory Visit: Payer: Medicare Other | Admitting: Physical Therapy

## 2023-12-13 DIAGNOSIS — R262 Difficulty in walking, not elsewhere classified: Secondary | ICD-10-CM

## 2023-12-13 DIAGNOSIS — M25551 Pain in right hip: Secondary | ICD-10-CM

## 2023-12-13 DIAGNOSIS — M6281 Muscle weakness (generalized): Secondary | ICD-10-CM | POA: Diagnosis not present

## 2023-12-13 NOTE — Therapy (Signed)
OUTPATIENT PHYSICAL THERAPY LOWER EXTREMITY TREATMENT   Patient Name: Candace Frey MRN: [id number] DOB:09/30/1951, 73 y.o., female Today's Date: 12/13/2023  END OF SESSION:  PT End of Session - 12/13/23 1347     Visit Number 5    Number of Visits 12    Date for PT Re-Evaluation 02/06/24    Authorization Type UHC auth required-  11/14/2023 - 02/06/2024 12 visits approved    Authorization - Visit Number 5    Authorization - Number of Visits 12    PT Start Time 1348    PT Stop Time 1429    PT Time Calculation (min) 41 min    Activity Tolerance Patient tolerated treatment well    Behavior During Therapy [facility] for tasks assessed/performed              Past Medical History:  Diagnosis Date   Acute deep vein thrombosis (DVT) of distal vein of right lower extremity (HCC) 07/22/2016   Acute medial meniscal tear 01/29/2015   Chicken pox    Chronic obstructive lung disease (HCC) 05/23/2019   Colon polyps    Complication of anesthesia    slow to wake up    COPD (chronic obstructive pulmonary disease) (HCC)    Genital warts    Multiple pulmonary nodules determined by computed tomography of lung 07/05/2017   CT 03/03/17  Right lower lobe nodularity described on the prior exam is less apparent today, favored to represent an area of scarring. other tiny nodules described on the prior are not readily apparent. No enlarging or dominant nodule identified > rec continue low dose annual  screening until 2023 per PCP (pt has fm hx lung ca)    Osteopenia    Shortness of breath dyspnea    with exertion was a smoker for many year per patient    Thyroid disease    UTI (urinary tract infection)    Past Surgical History:  Procedure Laterality Date   BREAST BIOPSY     COLONOSCOPY     KNEE ARTHROSCOPY Right 01/29/2015   meniscus repair Procedure: RIGHT KNEE ARTHROSCOPY WITH DEBRIDEMENT  ;  Surgeon: Ollen Gross, MD;  Location: [facility];  Service: Orthopedics;  Laterality: Right;    THYROIDECTOMY, PARTIAL  1998   Patient Active Problem List   Diagnosis Date Noted   Complication of anesthesia    Hyperlipidemia 06/28/2022   Aortic atherosclerosis (HCC) 06/18/2021   Acquired hypothyroidism 07/17/2020   COPD GOLD I  07/05/2017   History of DVT (deep vein thrombosis) 07/22/2016    PCP: Shelva Majestic, MD  REFERRING PROVIDER: Rodolph Bong, MD  REFERRING DIAG: [phone] (ICD-10-CM) - Chronic right hip pain  THERAPY DIAG:  Pain in right hip  Muscle weakness (generalized)  Difficulty in walking, not elsewhere classified  Rationale for Evaluation and Treatment: Rehabilitation  ONSET DATE: 08/23/23  SUBJECTIVE:   SUBJECTIVE STATEMENT: 12/13/2023 States that sometimes when she stands up after sitting for no more than 30 minutes her right catches and sometimes she has to work it out.    Eval: States she was doing pulmonary rehab because she is an ex smoker and she was doing great. States she went to [place] for 10 days and she flew and stayed in the hospital with her family and was very less mobile. States she didn't notice anything in her body. Stats that she couldn't even take her usually stride and had swelling along the front her her right hip. States that her OB did internal  [place]  and didn't see much of anything but swelling. States they then had an external [place] which had no hernia. States today she is doing good today. States that yesterday she could not push up from the stairs.   States that she sometimes has pain along the front of her right thigh and some times it is along the right side of her shin. States that her gait effects her when her pain is really bad and stride length reduce. States today she feels great.   PERTINENT HISTORY: COPD hyperlipidemia history of DVT, osteopenia, thyroid removal (half remains), PAIN:  Are you having pain? Yes: NPRS scale: 4/10  Pain location: Right anterior pelvis, front of right thigh, right lateral shin Pain  description: stiffness Aggravating factors: Upstairs, walking Relieving factors: Rest  PRECAUTIONS: None  RED FLAGS: None   WEIGHT BEARING RESTRICTIONS: No  FALLS:  Has patient fallen in last 6 months? No    PLOF: Independent  PATIENT GOALS: To have less pain and to be able to return to pulmonary rehab without limitations or pain  OBJECTIVE:  Note: Objective measures were completed at Evaluation unless otherwise noted.  DIAGNOSTIC FINDINGS:  10/25/23 US pelvis FINDINGS: No distinct masses are identified within the right groin, area of concern. No sonographic evidence of hernia. No focal fluid collections are demonstrated. No sonographic abnormalities are detected.   IMPRESSION: 1. Unremarkable ultrasound of the right groin. No sonographic evidence of hernia.  PATIENT SURVEYS:  FOTO 42%  COGNITION: Overall cognitive status: Within functional limits for tasks assessed     SENSATION: Not tested  EDEMA:  Circumferential: At mid calf 26 cm left 28 cm right    POSTURE: rounded shoulders and flexed trunk   PALPATION: Severe tenderness noted on along right femoral triangle with palpable swelling noted        LE Measurements Lower Extremity Right 11/21/2023 Left 11/21/2023   A/PROM MMT A/PROM MMT  Hip Flexion      Hip Extension      Hip Abduction  4-  3+  Hip Adduction      Hip Internal rotation 45  30   Hip External rotation 65  65   Knee Flexion      Knee Extension      Ankle Dorsiflexion      Ankle Plantarflexion      Ankle Inversion      Ankle Eversion       (Blank rows = not tested) * pain    LOWER EXTREMITY SPECIAL TESTS:  Negative straight leg raise bilaterally Special tests: R leg equal to longer with long sitting Ely's test negative bilaterally       TODAY'S TREATMENT:                                                                                                                              DATE:   12/13/2023  Therapeutic  Exercise: Reviewed HEP and answered all answer questions  about anatomy  10 minutes  Prone:  Seated:  Standing: Neuromuscular Re-education: sleeping postures and positions - tolerated well - educated in different types of support, position of shoulder blades and use of pillows 15 minutes Manual Therapy:  Therapeutic Activity: Self Care: on sitting/laying postures to help with edema and pinching in hip - practiced and discussed reducing long sitting.  15 minutes Trigger Point Dry Needling:  Modalities:    PATIENT EDUCATION:  Education details: on HEP Person educated: Patient Education method: Explanation, Facilities manager, and Handouts Education comprehension: verbalized understanding   HOME EXERCISE PROGRAM: [id number]  ASSESSMENT:  CLINICAL IMPRESSION: 12/13/2023 Session focused on education and answering all questions.  Discussed different postures and positions that increase pain and pressure on certain spots of her body.  Overall patient doing very well and progressing in goals.  Updated home exercise program and will continue with current plan of care as tolerated.  Eval:Patient presents to physical therapy with complaints of right anterior hip pain that started a couple months ago.  Patient presents with palpable swelling along this region.  Responded well to lymph massage and patient educated in self limb massage to help manage swelling in this area.  Patient with notable swelling, and functional deficits that are contributing to reduced quality of life.  Patient greatly benefit from skilled PT to address physical impairments and improve overall function at home and in the community.  OBJECTIVE IMPAIRMENTS: decreased activity tolerance, increased edema, improper body mechanics, postural dysfunction, and pain.   ACTIVITY LIMITATIONS: stairs, transfers, and locomotion level  PARTICIPATION LIMITATIONS: laundry and community activity  PERSONAL FACTORS: 1-2 comorbidities: History of  DVT, 1/2 thyroid removed  are also affecting patient's functional outcome.   REHAB POTENTIAL: Good  CLINICAL DECISION MAKING: Stable/uncomplicated  EVALUATION COMPLEXITY: Low   GOALS: Goals reviewed with patient? yes  SHORT TERM GOALS: Target date: 12/26/2023 Patient will be independent in self management strategies to improve quality of life and functional outcomes. Baseline: New Program Goal status: Met  2.  Patient will report at least 50% improvement in overall symptoms and/or function to demonstrate improved functional mobility Baseline: 0% better Goal status: Met  3.  Patient will report performing daily lymph massage to help with edema management Baseline: Not currently Goal status: Met     LONG TERM GOALS: Target date: 02/06/2024  Patient will report at least 75% improvement in overall symptoms and/or function to demonstrate improved functional mobility Baseline: 0% better Goal status: Progressing  2.  Patient will improve score on FOTO outcomes measure to projected score to demonstrate overall improved function and QOL Baseline: see above Goal status: Progressing  3.  Patient will be able to go up and down stairs without pain to improve community function Baseline: Painful at times Goal status: Progressing    PLAN:  PT FREQUENCY: 1-2x/week for total of 12 visits over 12 weeks certification period  PT DURATION: 12 weeks  PLANNED INTERVENTIONS: 97110-Therapeutic exercises, 97530- Therapeutic activity, 97112- Neuromuscular re-education, 97535- Self Care, 25366- Manual therapy, [phone]- Gait training, [phone]- Orthotic Fit/training, [phone]- Canalith repositioning, [id number]- Aquatic Therapy, 97014- Electrical stimulation (unattended), [phone]- Ionotophoresis 4mg /ml Dexamethasone, Patient/Family education, Balance training, Stair training, Taping, Dry Needling, Joint mobilization, Joint manipulation, Spinal manipulation, Spinal mobilization, Cryotherapy, and Moist  heat   PLAN FOR NEXT SESSION: Progress note  left sidebending for trunk  Lymph management, right hip range of motion and strength assess, leg length assess   5:01 PM, 12/13/23 Tereasa Coop, DPT Physical Therapy with [facility]

## 2023-12-22 ENCOUNTER — Other Ambulatory Visit: Payer: Self-pay | Admitting: Family Medicine

## 2023-12-22 MED ORDER — LEVOTHYROXINE SODIUM 88 MCG PO TABS: 88.0000 ug | ORAL_TABLET | Freq: Every day | ORAL | 3 refills | Status: DC

## 2023-12-23 ENCOUNTER — Other Ambulatory Visit: Payer: Self-pay

## 2023-12-23 MED ORDER — LEVOTHYROXINE SODIUM 88 MCG PO TABS: 88.0000 ug | ORAL_TABLET | Freq: Every day | ORAL | 3 refills | Status: DC

## 2024-01-17 ENCOUNTER — Ambulatory Visit (INDEPENDENT_AMBULATORY_CARE_PROVIDER_SITE_OTHER): Payer: Medicare Other

## 2024-01-17 VITALS — BP 110/64 | HR 73 | Temp 98.3°F | Ht 69.0 in | Wt 166.6 lb

## 2024-01-17 DIAGNOSIS — Z0000 Encounter for general adult medical examination without abnormal findings: Secondary | ICD-10-CM

## 2024-01-17 NOTE — Patient Instructions (Signed)
 [name] , Thank you for taking time to come for your Medicare Wellness Visit. I appreciate your ongoing commitment to your health goals. Please review the following plan we discussed and let me know if I can assist you in the future.   Referrals/Orders/Follow-Ups/Clinician Recommendations: maintain health and activity  Aim for 30 minutes of exercise or brisk walking, 6-8 glasses of water, and 5 servings of fruits and vegetables each day.   This is a list of the screening recommended for you and due dates:  Health Maintenance  Topic Date Due   Zoster (Shingles) Vaccine (1 of 2) 01/22/2024*   COVID-19 Vaccine (8 - 2024-25 season) 10/23/2024*   DTaP/Tdap/Td vaccine (2 - Td or Tdap) 02/22/2024   Medicare Annual Wellness Visit  01/16/2025   Mammogram  04/13/2025   Colon Cancer Screening  05/14/2027   Pneumonia Vaccine  Completed   Flu Shot  Completed   DEXA scan (bone density measurement)  Completed   Hepatitis C Screening  Completed   HPV Vaccine  Aged Out  *Topic was postponed. The date shown is not the original due date.    Advanced directives: (Copy Requested) Please bring a copy of your health care power of attorney and living will to the office to be added to your chart at your convenience.  Next Medicare Annual Wellness Visit scheduled for next year: Yes

## 2024-01-17 NOTE — Progress Notes (Addendum)
 Subjective:   Candace Frey is a 73 y.o. who presents for a Medicare Wellness preventive visit.  Visit Complete: In person    AWV Questionnaire: No: Patient Medicare AWV questionnaire was not completed prior to this visit.  Cardiac Risk Factors include: advanced age (>50men, >67 women);dyslipidemia     Objective:    Today's Vitals   01/17/24 1053  BP: 110/64  Pulse: 73  Temp: 98.3 F (36.8 C)  SpO2: 94%  Weight: 166 lb 9.6 oz (75.6 kg)  Height: 5\' 9"  (1.753 m)   Body mass index is 24.6 kg/m.     01/17/2024   11:03 AM 11/14/2023    9:48 AM 06/06/2023   11:02 AM 12/30/2022    2:37 PM 08/20/2022   12:29 PM 10/20/2021   11:09 AM 06/22/2019   10:28 AM  Advanced Directives  Does Patient Have a Medical Advance Directive? Yes Yes Yes Yes No Yes No  Type of Estate agent of [place];Living will  Healthcare Power of [place];Living will Healthcare Power of [place];Living will     Does patient want to make changes to medical advance directive?  Yes (MAU/Ambulatory/Procedural Areas - Information given) No - Patient declined   Yes (MAU/Ambulatory/Procedural Areas - Information given)   Copy of Healthcare Power of Attorney in Chart? No - copy requested  No - copy requested No - copy requested     Would patient like information on creating a medical advance directive?     No - Patient declined  Yes (MAU/Ambulatory/Procedural Areas - Information given)    Current Medications (verified) Outpatient Encounter Medications as of 01/17/2024  Medication Sig   Calcium Carbonate-Vitamin D (CALCIUM + D PO) Take 1 tablet by mouth daily.    COMIRNATY syringe    Estradiol 0.75 MG/1.25 GM (0.06%) topical gel Place 1.25 g onto the skin daily.   levothyroxine (SYNTHROID) 88 MCG tablet Take 1 tablet (88 mcg total) by mouth daily.   Multiple Vitamins-Minerals (PRESERVISION AREDS 2 PO) Take 1 tablet by mouth 2 (two) times daily.   progesterone (PROMETRIUM) 100 MG capsule  Take 100 mg by mouth at bedtime.   tacrolimus (PROTOPIC) 0.1 % ointment Apply 1 Application topically as needed.   albuterol (VENTOLIN HFA) 108 (90 Base) MCG/ACT inhaler Inhale 1-2 puffs into the lungs every 6 (six) hours as needed. (Patient not taking: Reported on 01/17/2024)   No facility-administered encounter medications on file as of 01/17/2024.    Allergies (verified) Patient has no known allergies.   History: Past Medical History:  Diagnosis Date   Acute deep vein thrombosis (DVT) of distal vein of right lower extremity (HCC) 07/22/2016   Acute medial meniscal tear 01/29/2015   Chicken pox    Chronic obstructive lung disease (HCC) 05/23/2019   Colon polyps    Complication of anesthesia    slow to wake up    COPD (chronic obstructive pulmonary disease) (HCC)    Genital warts    Multiple pulmonary nodules determined by computed tomography of lung 07/05/2017   CT 03/03/17  Right lower lobe nodularity described on the prior exam is less apparent today, favored to represent an area of scarring. other tiny nodules described on the prior are not readily apparent. No enlarging or dominant nodule identified > rec continue low dose annual  screening until 2023 per PCP (pt has fm hx lung ca)    Osteopenia    Shortness of breath dyspnea    with exertion was a smoker for many year  per patient    Thyroid disease    UTI (urinary tract infection)    Past Surgical History:  Procedure Laterality Date   BREAST BIOPSY     COLONOSCOPY     KNEE ARTHROSCOPY Right 01/29/2015   meniscus repair Procedure: RIGHT KNEE ARTHROSCOPY WITH DEBRIDEMENT  ;  Surgeon: Ollen Gross, MD;  Location: [facility];  Service: Orthopedics;  Laterality: Right;   THYROIDECTOMY, PARTIAL  1998   Family History  Problem Relation Age of Onset   Dementia Mother        lewy body dementia   Lung cancer Father    Prostate cancer Father        mets to bone   CVA Father        late 90s. smoker   Alcoholism Father     Alcoholism Paternal Grandfather    Colon cancer Neg Hx    Esophageal cancer Neg Hx    Rectal cancer Neg Hx    Stomach cancer Neg Hx    Social History   Socioeconomic History   Marital status: Married    Spouse name: Not on file   Number of children: 3   Years of education: Not on file   Highest education level: Not on file  Occupational History   Occupation: part-time  Tobacco Use   Smoking status: Former    Current packs/day: 0.00    Average packs/day: 2.0 packs/day for 43.0 years (86.0 ttl pk-yrs)    Types: Cigarettes    Start date: 11/23/1963    Quit date: 11/22/2006    Years since quitting: 17.1   Smokeless tobacco: Never  Vaping Use   Vaping status: Never Used  Substance and Sexual Activity   Alcohol use: Yes    Alcohol/week: 7.0 standard drinks of alcohol    Types: 7 Glasses of wine per week    Comment: 2 glasses of wine per night   Drug use: No   Sexual activity: Yes  Other Topics Concern   Not on file  Social History Narrative   Married 45 years in 2022. 2 kids. 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      Hobbies: enjoys time with grandkids, people, gardening, theatre, music      Social Drivers of Health   Financial Resource Strain: Low Risk  (01/17/2024)   Overall Financial Resource Strain (CARDIA)    Difficulty of Paying Living Expenses: Not hard at all  Food Insecurity: No Food Insecurity (01/17/2024)   Hunger Vital Sign    Worried About Running Out of Food in the Last Year: Never true    Ran Out of Food in the Last Year: Never true  Transportation Needs: No Transportation Needs (01/17/2024)   PRAPARE - Administrator, Civil Service (Medical): No    Lack of Transportation (Non-Medical): No  Physical Activity: Insufficiently Active (01/17/2024)   Exercise  Vital Sign    Days of Exercise per Week: 3 days    Minutes of Exercise per Session: 30 min  Stress: No Stress Concern Present (01/17/2024)   [employer] of Occupational Health - Occupational Stress Questionnaire    Feeling of Stress : Only a little  Social Connections: Moderately Isolated (01/17/2024)   Social Connection and Isolation  Panel [NHANES]    Frequency of Communication with Friends and Family: More than three times a week    Frequency of Social Gatherings with Friends and Family: Once a week    Attends Religious Services: Never    Database administrator or Organizations: No    Attends Engineer, structural: Never    Marital Status: Married    Tobacco Counseling Counseling given: Not Answered    Clinical Intake:  Pre-visit preparation completed: Yes  Pain : No/denies pain     BMI - recorded: 24.6 Nutritional Status: BMI of 19-24  Normal Nutritional Risks: None Diabetes: No  How often do you need to have someone help you when you read instructions, pamphlets, or other written materials from your doctor or pharmacy?: 1 - Never  Interpreter Needed?: No  Information entered by :: Lanier Ensign, LPN   Activities of Daily Living     01/17/2024   10:57 AM  In your present state of health, do you have any difficulty performing the following activities:  Hearing? 0  Vision? 0  Difficulty concentrating or making decisions? 0  Walking or climbing stairs? 0  Dressing or bathing? 0  Doing errands, shopping? 0  Preparing Food and eating ? N  Using the Toilet? N  In the past six months, have you accidently leaked urine? N  Do you have problems with loss of bowel control? N  Managing your Medications? N  Managing your Finances? N  Housekeeping or managing your Housekeeping? N    Patient Care Team: Shelva Majestic, MD as PCP - General (Family Medicine) Pyrtle, Carie Caddy, MD as Consulting Physician (Gastroenterology) Nyoka Cowden, MD as Consulting  Physician (Pulmonary Disease)  Indicate any recent Medical Services you may have received from other than [facility] providers in the past year (date may be approximate).     Assessment:   This is a routine wellness examination for Daniela.  Hearing/Vision screen Hearing Screening - Comments:: Pt denies any hearing issues  Vision Screening - Comments:: Pt follows up with Dr Dione Booze and Dr Essie Hart for eye exams    Goals Addressed             This Visit's Progress    Patient Stated       Lose weight and continue exercise        Depression Screen     01/17/2024   11:01 AM 10/24/2023    1:39 PM 07/18/2023    9:44 AM 06/06/2023   11:21 AM 01/11/2023   10:18 AM 12/30/2022    2:36 PM 12/25/2021    9:38 AM  PHQ 2/9 Scores  PHQ - 2 Score 0 0 0 0 0 0 0  PHQ- 9 Score   2 2 1   0    Fall Risk     01/17/2024   11:05 AM 09/08/2023    8:38 AM 08/25/2023   11:41 AM 08/23/2023   11:28 AM 08/18/2023   10:39 AM  Fall Risk   Falls in the past year? 0 0 0 0 0  Number falls in past yr: 0 0 0 0 0  Injury with Fall? 0 0 0 0 0  Risk for fall due to : No Fall Risks No Fall Risks No Fall Risks No Fall Risks No Fall Risks  Follow up Falls prevention discussed;Falls evaluation completed Falls evaluation completed Falls evaluation completed Falls evaluation completed Falls evaluation completed    MEDICARE RISK AT HOME:  Medicare Risk at Home  Any stairs in or around the home?: Yes If so, are there any without handrails?: No Home free of loose throw rugs in walkways, pet beds, electrical cords, etc?: Yes Adequate lighting in your home to reduce risk of falls?: Yes Life alert?: No Use of a cane, walker or w/c?: No Grab bars in the bathroom?: Yes Shower chair or bench in shower?: No Elevated toilet seat or a handicapped toilet?: Yes  TIMED UP AND GO:  Was the test performed?  Yes  Length of time to ambulate 10 feet: 10 sec Gait steady and fast without use of assistive device  Cognitive Function: 6CIT  completed        01/17/2024   11:05 AM 12/30/2022    2:39 PM 10/20/2021   11:11 AM  6CIT Screen  What Year? 0 points 0 points 0 points  What month? 0 points 0 points 0 points  What time? 0 points 0 points 0 points  Count back from 20 0 points 0 points 0 points  Months in reverse 0 points 0 points 0 points  Repeat phrase 0 points 0 points 0 points  Total Score 0 points 0 points 0 points    Immunizations Immunization History  Administered Date(s) Administered   Fluad Quad(high Dose 65+) 09/06/2020, 10/08/2021   Fluad Trivalent(High Dose 65+) 10/24/2023   Influenza, High Dose Seasonal PF 08/18/2022   Influenza,inj,Quad PF,6+ Mos 09/08/2019   Moderna Covid-19 Fall Seasonal Vaccine 18yrs & older 08/27/2022   Moderna Covid-19 Seasonal Vaccine 6 months thru 75years of age 13/09/2023   [facility] Covid-19 Vaccine Bivalent Booster 19yrs & up 07/06/2021   Moderna SARS-COV2 Booster Vaccination 10/01/2020, 03/21/2021   Moderna Sars-Covid-2 Vaccination 12/21/2019, 01/18/2020   Pneumococcal Conjugate-13 07/14/2020   Pneumococcal Polysaccharide-23 07/01/2014, 06/14/2016   Tdap 02/21/2014    Screening Tests Health Maintenance  Topic Date Due   Zoster Vaccines- Shingrix (1 of 2) 01/22/2024 (Originally 06/03/1999)   COVID-19 Vaccine (8 - 2024-25 season) 10/23/2024 (Originally 09/28/2023)   DTaP/Tdap/Td (2 - Td or Tdap) 02/22/2024   Medicare Annual Wellness (AWV)  01/16/2025   MAMMOGRAM  04/13/2025   Colonoscopy  05/14/2027   Pneumonia Vaccine 71+ Years old  Completed   INFLUENZA VACCINE  Completed   DEXA SCAN  Completed   Hepatitis C Screening  Completed   HPV VACCINES  Aged Out    Health Maintenance  There are no preventive care reminders to display for this patient.  Health Maintenance Items Addressed   Additional Screening:  Vision Screening: Recommended annual ophthalmology exams for early detection of glaucoma and other disorders of the eye.  Dental Screening: Recommended  annual dental exams for proper oral hygiene  Community Resource Referral / Chronic Care Management: CRR required this visit?  No   CCM required this visit?  No     Plan:     I have personally reviewed and noted the following in the patient's chart:   Medical and social history Use of alcohol, tobacco or illicit drugs  Current medications and supplements including opioid prescriptions. Patient is not currently taking opioid prescriptions. Functional ability and status Nutritional status Physical activity Advanced directives List of other physicians Hospitalizations, surgeries, and ER visits in previous 12 months Vitals Screenings to include cognitive, depression, and falls Referrals and appointments  In addition, I have reviewed and discussed with patient certain preventive protocols, quality metrics, and best practice recommendations. A written personalized care plan for preventive services as well as general preventive health recommendations were provided to patient.  Marzella Schlein, LPN   1/61/0960   After Visit Summary: (In Person-Printed) AVS printed and given to the patient  Notes: Nothing significant to report at this time.

## 2024-01-18 ENCOUNTER — Encounter: Payer: Self-pay | Admitting: Physical Therapy

## 2024-01-18 ENCOUNTER — Ambulatory Visit (INDEPENDENT_AMBULATORY_CARE_PROVIDER_SITE_OTHER): Payer: Medicare Other | Admitting: Physical Therapy

## 2024-01-18 DIAGNOSIS — M25551 Pain in right hip: Secondary | ICD-10-CM

## 2024-01-18 DIAGNOSIS — M6281 Muscle weakness (generalized): Secondary | ICD-10-CM

## 2024-01-18 DIAGNOSIS — R262 Difficulty in walking, not elsewhere classified: Secondary | ICD-10-CM

## 2024-01-18 NOTE — Therapy (Addendum)
 PHYSICAL THERAPY DISCHARGE SUMMARY  Visits from Start of Care: 6  Current functional level related to goals / functional outcomes: See below   Remaining deficits: See below   Education / Equipment: See below   Patient agrees to discharge. Patient goals were partially met. Patient is being discharged due to not returning since the last visit.  9:47 AM, 02/28/24 Tereasa Coop, DPT Physical Therapy with [place]   OUTPATIENT PHYSICAL THERAPY LOWER EXTREMITY TREATMENT Progress Note Reporting Period 11/13/24 to 01/18/24  See note below for Objective Data and Assessment of Progress/Goals.      Patient Name: Candace Frey MRN: [id number] DOB:02-25-1951, 73 y.o., female Today's Date: 01/18/2024  END OF SESSION:  PT End of Session - 01/18/24 1012     Visit Number 6    Number of Visits 12    Date for PT Re-Evaluation 02/06/24    Authorization Type UHC auth required-  11/14/2023 - 02/06/2024 12 visits approved    Authorization - Visit Number 6    Authorization - Number of Visits 12    PT Start Time 1015    PT Stop Time 1100    PT Time Calculation (min) 45 min    Activity Tolerance Patient tolerated treatment well    Behavior During Therapy [facility] for tasks assessed/performed              Past Medical History:  Diagnosis Date   Acute deep vein thrombosis (DVT) of distal vein of right lower extremity (HCC) 07/22/2016   Acute medial meniscal tear 01/29/2015   Chicken pox    Chronic obstructive lung disease (HCC) 05/23/2019   Colon polyps    Complication of anesthesia    slow to wake up    COPD (chronic obstructive pulmonary disease) (HCC)    Genital warts    Multiple pulmonary nodules determined by computed tomography of lung 07/05/2017   CT 03/03/17  Right lower lobe nodularity described on the prior exam is less apparent today, favored to represent an area of scarring. other tiny nodules described on the prior are not readily apparent. No enlarging or  dominant nodule identified > rec continue low dose annual  screening until 2023 per PCP (pt has fm hx lung ca)    Osteopenia    Shortness of breath dyspnea    with exertion was a smoker for many year per patient    Thyroid disease    UTI (urinary tract infection)    Past Surgical History:  Procedure Laterality Date   BREAST BIOPSY     COLONOSCOPY     KNEE ARTHROSCOPY Right 01/29/2015   meniscus repair Procedure: RIGHT KNEE ARTHROSCOPY WITH DEBRIDEMENT  ;  Surgeon: Ollen Gross, MD;  Location: [facility];  Service: Orthopedics;  Laterality: Right;   THYROIDECTOMY, PARTIAL  1998   Patient Active Problem List   Diagnosis Date Noted   Complication of anesthesia    Hyperlipidemia 06/28/2022   Aortic atherosclerosis (HCC) 06/18/2021   Acquired hypothyroidism 07/17/2020   COPD GOLD I  07/05/2017   History of DVT (deep vein thrombosis) 07/22/2016    PCP: Shelva Majestic, MD  REFERRING PROVIDER: Rodolph Bong, MD  REFERRING DIAG: [phone] (ICD-10-CM) - Chronic right hip pain  THERAPY DIAG:  Pain in right hip  Muscle weakness (generalized)  Difficulty in walking, not elsewhere classified  Rationale for Evaluation and Treatment: Rehabilitation  ONSET DATE: 08/23/23  SUBJECTIVE:   SUBJECTIVE STATEMENT: 01/18/2024 States that she is still having some right hip pain. After  sitting for a half hour then when she goes to get out of the car she is limping for about 20 steps. States that it is a certain length of time.    Eval: States she was doing pulmonary rehab because she is an ex smoker and she was doing great. States she went to [place] for 10 days and she flew and stayed in the hospital with her family and was very less mobile. States she didn't notice anything in her body. Stats that she couldn't even take her usually stride and had swelling along the front her her right hip. States that her OB did internal [place]  and didn't see much of anything but swelling. States they then had an  external [place] which had no hernia. States today she is doing good today. States that yesterday she could not push up from the stairs.   States that she sometimes has pain along the front of her right thigh and some times it is along the right side of her shin. States that her gait effects her when her pain is really bad and stride length reduce. States today she feels great.   PERTINENT HISTORY: COPD hyperlipidemia history of DVT, osteopenia, thyroid removal (half remains), PAIN:  Are you having pain? Yes: NPRS scale: 4/10  Pain location: Right anterior pelvis, front of right thigh, right lateral shin Pain description: stiffness Aggravating factors: Upstairs, walking Relieving factors: Rest  PRECAUTIONS: None  RED FLAGS: None   WEIGHT BEARING RESTRICTIONS: No  FALLS:  Has patient fallen in last 6 months? No    PLOF: Independent  PATIENT GOALS: To have less pain and to be able to return to pulmonary rehab without limitations or pain  OBJECTIVE:  Note: Objective measures were completed at Evaluation unless otherwise noted.  DIAGNOSTIC FINDINGS:  10/25/23 US pelvis FINDINGS: No distinct masses are identified within the right groin, area of concern. No sonographic evidence of hernia. No focal fluid collections are demonstrated. No sonographic abnormalities are detected.   IMPRESSION: 1. Unremarkable ultrasound of the right groin. No sonographic evidence of hernia.  PATIENT SURVEYS:  FOTO 42%  COGNITION: Overall cognitive status: Within functional limits for tasks assessed     SENSATION: Not tested  EDEMA:  Circumferential: At mid calf 26 cm left 28 cm right    POSTURE: rounded shoulders and flexed trunk   PALPATION: Severe tenderness noted on along right femoral triangle with palpable swelling noted        LE Measurements Lower Extremity Right 01/18/24 Left 01/18/24   A/PROM MMT A/PROM MMT  Hip Flexion      Hip Extension 10 3+ 10 4-  Hip Abduction       Hip Adduction      Hip Internal rotation 45  30   Hip External rotation 65  65   Knee Flexion      Knee Extension      Ankle Dorsiflexion      Ankle Plantarflexion      Ankle Inversion      Ankle Eversion       (Blank rows = not tested) * pain         TODAY'S TREATMENT:  DATE:   01/18/2024  Therapeutic Exercise: Reviewed HEP and answered all answer questions about anatomy   Prone:  Seated: self mobilization to right iliacus 3 minutes, bridges with post pelvic tilt x10 5" holds  Standing: Neuromuscular Re-education: sitting postures/positions in regards to pain and anterior pinching- feet on floor - reduced seat dump and higher seat height 15 minutes  Manual Therapy: STM to right hip flexors - tolerated well 5 minutes  Gait Training: walking outside- fast/slow- walking mechanics- arm swing - hip extension 15 minutes prior demo and cueing throughout  Self Care: Trigger Point Dry Needling:  Modalities:    PATIENT EDUCATION:  Education details: on HEP Person educated: Patient Education method: Explanation, Facilities manager, and Handouts Education comprehension: verbalized understanding   HOME EXERCISE PROGRAM: [id number]  ASSESSMENT:  CLINICAL IMPRESSION: 01/18/2024 Focused on continued discomfort and plan moving forward. Educated patient in walking mechanics and seated postures to reduce pinching on right hip flexors. Tolerated well. Answered all questions and not pain noted during or after session. Will continue with current POC as tolerated.   Eval:Patient presents to physical therapy with complaints of right anterior hip pain that started a couple months ago.  Patient presents with palpable swelling along this region.  Responded well to lymph massage and patient educated in self limb massage to help manage swelling in this area.  Patient with  notable swelling, and functional deficits that are contributing to reduced quality of life.  Patient greatly benefit from skilled PT to address physical impairments and improve overall function at home and in the community.  OBJECTIVE IMPAIRMENTS: decreased activity tolerance, increased edema, improper body mechanics, postural dysfunction, and pain.   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   REHAB POTENTIAL: Good  CLINICAL DECISION MAKING: Stable/uncomplicated  EVALUATION COMPLEXITY: Low   GOALS: Goals reviewed with patient? yes  SHORT TERM GOALS: Target date: 12/26/2023 Patient will be independent in self management strategies to improve quality of life and functional outcomes. Baseline: New Program Goal status: Met  2.  Patient will report at least 50% improvement in overall symptoms and/or function to demonstrate improved functional mobility Baseline: 0% better Goal status: Met  3.  Patient will report performing daily lymph massage to help with edema management Baseline: Not currently Goal status: Met     LONG TERM GOALS: Target date: 02/06/2024  Patient will report at least 75% improvement in overall symptoms and/or function to demonstrate improved functional mobility Baseline: 0% better Goal status: Progressing  2.  Patient will improve score on FOTO outcomes measure to projected score to demonstrate overall improved function and QOL Baseline: see above Goal status: Progressing  3.  Patient will be able to go up and down stairs without pain to improve community function Baseline: Painful at times Goal status: Progressing    PLAN:  PT FREQUENCY: 1-2x/week for total of 12 visits over 12 weeks certification period  PT DURATION: 12 weeks  PLANNED  INTERVENTIONS: 97110-Therapeutic exercises, 97530- Therapeutic activity, 97112- Neuromuscular re-education, 97535- Self Care, 16109- Manual therapy, [id number]- Gait training, [phone]- Orthotic Fit/training, [phone]- Canalith repositioning, [id number]- Aquatic Therapy, 97014- Electrical stimulation (unattended), [phone]- Ionotophoresis 4mg /ml Dexamethasone, Patient/Family education, Balance training, Stair training, Taping, Dry Needling, Joint mobilization, Joint manipulation, Spinal manipulation, Spinal mobilization, Cryotherapy, and Moist heat   PLAN FOR NEXT SESSION pelvic tilts - STM, walking mechanics, trunk rotation left  sidebending for trunk  Lymph management, right hip range of motion and strength assess, leg length assess   11:20 AM, 01/18/24 Tereasa Coop, DPT Physical Therapy [facility]

## 2024-01-25 ENCOUNTER — Other Ambulatory Visit: Payer: Self-pay

## 2024-01-25 ENCOUNTER — Emergency Department (HOSPITAL_COMMUNITY)
Admission: EM | Admit: 2024-01-25 | Discharge: 2024-01-25 | Discharge status: Against Medical Advice | Attending: Emergency Medicine | Admitting: Emergency Medicine

## 2024-01-25 DIAGNOSIS — R197 Diarrhea, unspecified: Secondary | ICD-10-CM | POA: Insufficient documentation

## 2024-01-25 DIAGNOSIS — Z5321 Procedure and treatment not carried out due to patient leaving prior to being seen by health care provider: Secondary | ICD-10-CM | POA: Diagnosis not present

## 2024-01-25 DIAGNOSIS — R1110 Vomiting, unspecified: Secondary | ICD-10-CM | POA: Diagnosis not present

## 2024-01-25 LAB — COMPREHENSIVE METABOLIC PANEL
ALT: 19 U/L (ref 0–44)
AST: 21 U/L (ref 15–41)
Albumin: 4 g/dL (ref 3.5–5.0)
Alkaline Phosphatase: 67 U/L (ref 38–126)
Anion gap: 11 (ref 5–15)
BUN: 14 mg/dL (ref 8–23)
CO2: 24 mmol/L (ref 22–32)
Calcium: 9.3 mg/dL (ref 8.9–10.3)
Chloride: 105 mmol/L (ref 98–111)
Creatinine, Ser: 1.1 mg/dL — ABNORMAL HIGH (ref 0.44–1.00)
GFR, Estimated: 53 mL/min — ABNORMAL LOW (ref >60)
Glucose, Bld: 153 mg/dL — ABNORMAL HIGH (ref 70–99)
Potassium: 4.5 mmol/L (ref 3.5–5.1)
Sodium: 140 mmol/L (ref 135–145)
Total Bilirubin: 0.9 mg/dL (ref 0.0–1.2)
Total Protein: 6.8 g/dL (ref 6.5–8.1)

## 2024-01-25 LAB — CBC
HCT: 46.4 % — ABNORMAL HIGH (ref 36.0–46.0)
Hemoglobin: 15.3 g/dL — ABNORMAL HIGH (ref 12.0–15.0)
MCH: 31.3 pg (ref 26.0–34.0)
MCHC: 33 g/dL (ref 30.0–36.0)
MCV: 94.9 fL (ref 80.0–100.0)
Platelets: 225 10*3/uL (ref 150–400)
RBC: 4.89 MIL/uL (ref 3.87–5.11)
RDW: 13.3 % (ref 11.5–15.5)
WBC: 13.3 10*3/uL — ABNORMAL HIGH (ref 4.0–10.5)
nRBC: 0 % (ref 0.0–0.2)

## 2024-01-25 LAB — RESP PANEL BY RT-PCR (RSV, FLU A&B, COVID)  RVPGX2
Influenza A by PCR: NEGATIVE
Influenza B by PCR: NEGATIVE
Resp Syncytial Virus by PCR: NEGATIVE
SARS Coronavirus 2 by RT PCR: NEGATIVE

## 2024-01-25 LAB — LIPASE, BLOOD: Lipase: 38 U/L (ref 11–51)

## 2024-01-25 MED ORDER — ONDANSETRON 4 MG PO TBDP
4.0000 mg | ORAL_TABLET | Freq: Once | ORAL | Status: AC | PRN
  Administered 2024-01-25: 4 mg via ORAL
  Filled 2024-01-25: qty 1

## 2024-01-25 NOTE — ED Notes (Signed)
 Pt left stating "we'll take the chance."

## 2024-01-25 NOTE — ED Triage Notes (Signed)
 Pt states that she had sudden onset of vomiting and diarrhea x 4 hours. Denies abdominal pain

## 2024-01-27 ENCOUNTER — Encounter: Payer: Medicare Other | Admitting: Family Medicine

## 2024-02-01 ENCOUNTER — Encounter: Payer: Medicare Other | Admitting: Physical Therapy

## 2024-02-01 DIAGNOSIS — H353132 Nonexudative age-related macular degeneration, bilateral, intermediate dry stage: Secondary | ICD-10-CM | POA: Diagnosis not present

## 2024-02-19 NOTE — Progress Notes (Signed)
 Elmer Sow. Pilar Plate, MD [facility] [email or web address]

## 2024-02-20 ENCOUNTER — Telehealth: Payer: Self-pay | Admitting: Family Medicine

## 2024-02-20 NOTE — Telephone Encounter (Signed)
 Pt fell / cut on thumb unable to stop bleeding. Advised ED or UC. Pt agreed.   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 They can not stop the bleeding (been more than a hour). They bleeding on their thumb. They skinned the knee and the bleeding have stop. Translation No Nurse Assessment Nurse: Ericka Pontiff, RN, [place] Date/Time Lamount Cohen Time): 02/18/2024 11:13:00 AM Confirm and document reason for call. If symptomatic, describe symptoms. ---Caller states she fell and is currently bleeding. She is bleeding from end of thumb Does the patient have any new or worsening symptoms? ---Yes Will a triage be completed? ---Yes Related visit to physician within the last 2 weeks? ---No Does the PT have any chronic conditions? (i.e. diabetes, asthma, this includes High risk factors for pregnancy, etc.) ---Unknown Is this a behavioral health or substance abuse call? ---No Guidelines Guideline Title Affirmed Question Affirmed Notes Nurse Date/Time (Eastern Time) Cuts and Lacerations Skin is split open or gaping (or length > 1/2 inch or 12 mm on the skin, 1/4 inch or 6 mm on the face) Ericka Pontiff, RN, [place] 02/18/2024 11:21:22 AM Disp. Time Lamount Cohen Time) Disposition Final User 02/18/2024 11:10:34 AM Send to Urgent Queue  [place], [place] Disp. Time Lamount Cohen Time) Disposition Final User 02/18/2024 11:22:48 AM Go to ED Now Yes Ericka Pontiff, RN, [place] Final Disposition 02/18/2024 11:22:48 AM Go to ED Now Yes Ericka Pontiff, RN, [place]ia Quay Disagree/Comply Comply Caller Understands Yes PreDisposition Did not know what to do Care Advice Given Per Guideline ALTERNATE DISPOSITION - URGENT CARE CENTER: * An Urgent Care Center can usually manage this problem, IF one is available in the caller's area. CARE ADVICE given per Cuts and Lacerations (Adult) guideline. GO TO ED NOW: Referrals [facility] at [facility]

## 2024-02-28 DIAGNOSIS — K080 Exfoliation of teeth due to systemic causes: Secondary | ICD-10-CM | POA: Diagnosis not present

## 2024-03-02 DIAGNOSIS — H353132 Nonexudative age-related macular degeneration, bilateral, intermediate dry stage: Secondary | ICD-10-CM | POA: Diagnosis not present

## 2024-03-06 DIAGNOSIS — D3132 Benign neoplasm of left choroid: Secondary | ICD-10-CM | POA: Diagnosis not present

## 2024-03-06 DIAGNOSIS — H353132 Nonexudative age-related macular degeneration, bilateral, intermediate dry stage: Secondary | ICD-10-CM | POA: Diagnosis not present

## 2024-03-06 DIAGNOSIS — H43813 Vitreous degeneration, bilateral: Secondary | ICD-10-CM | POA: Diagnosis not present

## 2024-03-06 DIAGNOSIS — H2513 Age-related nuclear cataract, bilateral: Secondary | ICD-10-CM | POA: Diagnosis not present

## 2024-03-12 DIAGNOSIS — N951 Menopausal and female climacteric states: Secondary | ICD-10-CM | POA: Diagnosis not present

## 2024-03-19 DIAGNOSIS — K080 Exfoliation of teeth due to systemic causes: Secondary | ICD-10-CM | POA: Diagnosis not present

## 2024-04-01 DIAGNOSIS — H353132 Nonexudative age-related macular degeneration, bilateral, intermediate dry stage: Secondary | ICD-10-CM | POA: Diagnosis not present

## 2024-04-10 DIAGNOSIS — K080 Exfoliation of teeth due to systemic causes: Secondary | ICD-10-CM | POA: Diagnosis not present

## 2024-04-19 ENCOUNTER — Encounter: Payer: Self-pay | Admitting: Family

## 2024-04-19 ENCOUNTER — Ambulatory Visit (INDEPENDENT_AMBULATORY_CARE_PROVIDER_SITE_OTHER): Admitting: Family

## 2024-04-19 VITALS — BP 112/57 | HR 67 | Temp 97.0°F | Ht 69.0 in | Wt 159.4 lb

## 2024-04-19 DIAGNOSIS — J441 Chronic obstructive pulmonary disease with (acute) exacerbation: Secondary | ICD-10-CM

## 2024-04-19 MED ORDER — BREZTRI AEROSPHERE 160-9-4.8 MCG/ACT IN AERO: 2.0000 | INHALATION_SPRAY | Freq: Two times a day (BID) | RESPIRATORY_TRACT | Status: DC

## 2024-04-19 MED ORDER — AZITHROMYCIN 250 MG PO TABS: ORAL_TABLET | ORAL | 0 refills | Status: AC

## 2024-04-19 NOTE — Addendum Note (Signed)
 Addended by: Alvie Jolly on: 04/19/2024 11:59 AM   Modules accepted: Orders

## 2024-04-19 NOTE — Progress Notes (Signed)
 Patient ID: Candace Frey, female    DOB: [birth year], 73 y.o.   MRN: [id number]  Chief Complaint  Patient presents with   Cough    Pt c/o sore throat, cough, chest congestion with yellow mucus. Present for 1 week, Has tried drinking plenty fluid, tylenol  and Cough syrup.   Discussed the use of AI scribe software for clinical note transcription with the patient, who gave verbal consent to proceed.  History of Present Illness Candace Frey "Candace Frey" is a 73 year old female with COPD who presents with a sore throat and respiratory symptoms. She initially experienced a sore throat, which has since improved. She now has respiratory symptoms without fever, though she suspects a mild fever occurred a few days ago. No ear pain or unusual sensations are present.  She used her Breztri  inhaler last night due to difficulty breathing. She was given this inhaler 6 months ago, but did not start until last night and states it did help her symptoms. Her previous inhaler caused her heart to race. She is unsure about the exact dosing instructions but recalls being told she could use it daily or as needed. She has not been on antibiotics recently and did not receive any for a recent tooth extraction. No allergies are reported. She manages her COPD with exercise and minimal medication use.  Assessment & Plan Chronic Obstructive Pulmonary Disease (COPD) exacerbation - COPD with current infection causing exacerbation symptoms. Breztri  inhaler used as needed. Albuterol  causes palpitations, patient prefers to avoid it. Lungs clear today. - Continue Breztri  inhaler, two puffs in the morning and two puffs at night for at least one week. - Advise increased water intake to 2L daily. - Call office next week if sx are not improved.  Acute Respiratory Infection - Acute respiratory infection with sore throat and lung involvement. Azithromycin prescribed to prevent worsening infection. - Prescribe azithromycin  (Z-Pak): two pills on the first day, followed by one pill daily for four more days, advised on use & SE - Advise increased water intake to 2L daily. - Continue Breztri  inhaler 2 puffs, bid for another week. - Call office back if sx are not improved by next week.   Subjective:     Outpatient Medications Prior to Visit  Medication Sig Dispense Refill   Calcium Carbonate-Vitamin D (CALCIUM + D PO) Take 1 tablet by mouth daily.      COMIRNATY syringe      Estradiol 0.75 MG/1.25 GM (0.06%) topical gel Place 1.25 g onto the skin daily.     levothyroxine  (SYNTHROID ) 88 MCG tablet Take 1 tablet (88 mcg total) by mouth daily. 90 tablet 3   Multiple Vitamins-Minerals (PRESERVISION AREDS 2 PO) Take 1 tablet by mouth 2 (two) times daily.     tacrolimus (PROTOPIC) 0.1 % ointment Apply 1 Application topically as needed.     albuterol  (VENTOLIN  HFA) 108 (90 Base) MCG/ACT inhaler Inhale 1-2 puffs into the lungs every 6 (six) hours as needed. (Patient not taking: Reported on 04/19/2024) 8 g 2   progesterone (PROMETRIUM) 100 MG capsule Take 100 mg by mouth at bedtime. (Patient not taking: Reported on 04/19/2024)     No facility-administered medications prior to visit.   Past Medical History:  Diagnosis Date   Acute deep vein thrombosis (DVT) of distal vein of right lower extremity (HCC) 07/22/2016   Acute medial meniscal tear 01/29/2015   Chicken pox    Chronic obstructive lung disease (HCC) 05/23/2019   Colon polyps  Complication of anesthesia    slow to wake up    COPD (chronic obstructive pulmonary disease) (HCC)    Genital warts    Multiple pulmonary nodules determined by computed tomography of lung 07/05/2017   CT 03/03/17  Right lower lobe nodularity described on the prior exam is less apparent today, favored to represent an area of scarring. other tiny nodules described on the prior are not readily apparent. No enlarging or dominant nodule identified > rec continue low dose annual  screening  until 2023 per PCP (pt has fm hx lung ca)    Osteopenia    Shortness of breath dyspnea    with exertion was a smoker for many year per patient    Thyroid  disease    UTI (urinary tract infection)    Past Surgical History:  Procedure Laterality Date   BREAST BIOPSY     COLONOSCOPY     KNEE ARTHROSCOPY Right 01/29/2015   meniscus repair Procedure: RIGHT KNEE ARTHROSCOPY WITH DEBRIDEMENT  ;  Surgeon: Liliane Rei, MD;  Location: [facility];  Service: Orthopedics;  Laterality: Right;   THYROIDECTOMY, PARTIAL  1998   No Known Allergies    Objective:    Physical Exam Vitals and nursing note reviewed.  Constitutional:      Appearance: Normal appearance.  Cardiovascular:     Rate and Rhythm: Normal rate and regular rhythm.  Pulmonary:     Effort: Pulmonary effort is normal.     Breath sounds: Normal breath sounds.  Musculoskeletal:        General: Normal range of motion.  Skin:    General: Skin is warm and dry.  Neurological:     Mental Status: She is alert.  Psychiatric:        Mood and Affect: Mood normal.        Behavior: Behavior normal.    BP (!) 112/57 (BP Location: Left Arm, Patient Position: Sitting, Cuff Size: Large)   Pulse 67   Temp (!) 97 F (36.1 C) (Temporal)   Ht 5\' 9"  (1.753 m)   Wt 159 lb 6 oz (72.3 kg)   SpO2 97%   BMI 23.54 kg/m  Wt Readings from Last 3 Encounters:  04/19/24 159 lb 6 oz (72.3 kg)  01/17/24 166 lb 9.6 oz (75.6 kg)  11/01/23 163 lb (73.9 kg)       Versa Gore, NP

## 2024-05-01 DIAGNOSIS — H353132 Nonexudative age-related macular degeneration, bilateral, intermediate dry stage: Secondary | ICD-10-CM | POA: Diagnosis not present

## 2024-05-29 ENCOUNTER — Encounter: Admitting: Family Medicine

## 2024-05-31 DIAGNOSIS — H353132 Nonexudative age-related macular degeneration, bilateral, intermediate dry stage: Secondary | ICD-10-CM | POA: Diagnosis not present

## 2024-06-12 ENCOUNTER — Encounter: Payer: Self-pay | Admitting: Family Medicine

## 2024-06-12 ENCOUNTER — Ambulatory Visit (INDEPENDENT_AMBULATORY_CARE_PROVIDER_SITE_OTHER): Admitting: Family Medicine

## 2024-06-12 VITALS — BP 110/70 | HR 55 | Temp 97.4°F | Ht 69.0 in | Wt 161.4 lb

## 2024-06-12 DIAGNOSIS — Z630 Problems in relationship with spouse or partner: Secondary | ICD-10-CM

## 2024-06-12 DIAGNOSIS — M25551 Pain in right hip: Secondary | ICD-10-CM
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
## 2024-06-12 NOTE — Addendum Note (Signed)
 Addended by: KATRINKA GARNETTE KIDD on: 06/12/2024 05:41 PM   Modules accepted: Level of Service

## 2024-06-12 NOTE — Patient Instructions (Addendum)
 I want you to call Dr. Joane back at [facility]- he wanted to have follow up visit in 6 weeks if not improved with physical therapy and had plans to consider MR arthrogram to look at the labrum- please reach out- they may be able to order or have you back in- could even message on mychart  Recommended follow up: Return for next already scheduled visit or sooner if needed.

## 2024-06-27 DIAGNOSIS — N952 Postmenopausal atrophic vaginitis: Secondary | ICD-10-CM | POA: Diagnosis not present

## 2024-06-30 DIAGNOSIS — H353132 Nonexudative age-related macular degeneration, bilateral, intermediate dry stage: Secondary | ICD-10-CM | POA: Diagnosis not present

## 2024-07-09 ENCOUNTER — Ambulatory Visit: Admitting: Family Medicine

## 2024-07-09 ENCOUNTER — Encounter: Admitting: Family Medicine

## 2024-07-30 DIAGNOSIS — H353132 Nonexudative age-related macular degeneration, bilateral, intermediate dry stage: Secondary | ICD-10-CM | POA: Diagnosis not present

## 2024-08-15 DIAGNOSIS — N951 Menopausal and female climacteric states: Secondary | ICD-10-CM | POA: Diagnosis not present

## 2024-08-29 DIAGNOSIS — H353132 Nonexudative age-related macular degeneration, bilateral, intermediate dry stage: Secondary | ICD-10-CM | POA: Diagnosis not present

## 2024-09-17 ENCOUNTER — Ambulatory Visit: Admitting: Family Medicine

## 2024-09-17 ENCOUNTER — Encounter: Payer: Self-pay | Admitting: Family Medicine

## 2024-09-17 ENCOUNTER — Ambulatory Visit: Payer: Self-pay | Admitting: Family Medicine

## 2024-09-17 VITALS — BP 120/60 | HR 69 | Temp 97.2°F | Ht 69.0 in | Wt 165.8 lb

## 2024-09-17 DIAGNOSIS — Z23 Encounter for immunization: Secondary | ICD-10-CM

## 2024-09-17 DIAGNOSIS — E785 Hyperlipidemia, unspecified: Secondary | ICD-10-CM

## 2024-09-17 DIAGNOSIS — E039 Hypothyroidism, unspecified: Secondary | ICD-10-CM | POA: Diagnosis not present

## 2024-09-17 DIAGNOSIS — E559 Vitamin D deficiency, unspecified: Secondary | ICD-10-CM

## 2024-09-17 DIAGNOSIS — Z0000 Encounter for general adult medical examination without abnormal findings: Secondary | ICD-10-CM

## 2024-09-17 LAB — CBC WITH DIFFERENTIAL/PLATELET
Basophils Absolute: 0 K/uL (ref 0.0–0.1)
Basophils Relative: 0.5 % (ref 0.0–3.0)
Eosinophils Absolute: 0.1 K/uL (ref 0.0–0.7)
Eosinophils Relative: 1.3 % (ref 0.0–5.0)
HCT: 42.7 % (ref 36.0–46.0)
Hemoglobin: 14.1 g/dL (ref 12.0–15.0)
Lymphocytes Relative: 42.9 % (ref 12.0–46.0)
Lymphs Abs: 2.2 K/uL (ref 0.7–4.0)
MCHC: 33 g/dL (ref 30.0–36.0)
MCV: 94.2 fl (ref 78.0–100.0)
Monocytes Absolute: 0.6 K/uL (ref 0.1–1.0)
Monocytes Relative: 11.7 % (ref 3.0–12.0)
Neutro Abs: 2.2 K/uL (ref 1.4–7.7)
Neutrophils Relative %: 43.6 % (ref 43.0–77.0)
Platelets: 206 K/uL (ref 150.0–400.0)
RBC: 4.54 Mil/uL (ref 3.87–5.11)
RDW: 13.9 % (ref 11.5–15.5)
WBC: 5.2 K/uL (ref 4.0–10.5)

## 2024-09-17 LAB — COMPREHENSIVE METABOLIC PANEL WITH GFR
ALT: 14 U/L (ref 0–35)
AST: 16 U/L (ref 0–37)
Albumin: 4.4 g/dL (ref 3.5–5.2)
Alkaline Phosphatase: 70 U/L (ref 39–117)
BUN: 15 mg/dL (ref 6–23)
CO2: 28 meq/L (ref 19–32)
Calcium: 8.9 mg/dL (ref 8.4–10.5)
Chloride: 104 meq/L (ref 96–112)
Creatinine, Ser: 0.69 mg/dL (ref 0.40–1.20)
GFR: 84.97 mL/min (ref >60.00)
Glucose, Bld: 96 mg/dL (ref 70–99)
Potassium: 4.4 meq/L (ref 3.5–5.1)
Sodium: 139 meq/L (ref 135–145)
Total Bilirubin: 0.7 mg/dL (ref 0.2–1.2)
Total Protein: 6.6 g/dL (ref 6.0–8.3)

## 2024-09-17 LAB — LIPID PANEL
Cholesterol: 192 mg/dL (ref 0–200)
HDL: 84.1 mg/dL (ref >39.00)
LDL Cholesterol: 91 mg/dL (ref 0–99)
NonHDL: 107.73
Total CHOL/HDL Ratio: 2
Triglycerides: 84 mg/dL (ref 0.0–149.0)
VLDL: 16.8 mg/dL (ref 0.0–40.0)

## 2024-09-17 LAB — TSH: TSH: 0.96 u[IU]/mL (ref 0.35–5.50)

## 2024-09-17 LAB — VITAMIN D 25 HYDROXY (VIT D DEFICIENCY, FRACTURES): VITD: 38.71 ng/mL (ref 30.00–100.00)

## 2024-09-17 MED ORDER — COVID-19 MRNA VAC-TRIS(PFIZER) 30 MCG/0.3ML IM SUSY: 0.3000 mL | PREFILLED_SYRINGE | Freq: Once | INTRAMUSCULAR | 0 refills | Status: AC

## 2024-09-17 NOTE — Patient Instructions (Addendum)
 Please stop by lab before you go If you have mychart- we will send your results within 3 business days of us  receiving them.  If you do not have mychart- we will call you about results within 5 business days of us  receiving them.  *please also note that you will see labs on mychart as soon as they post. I will later go in and write notes on them- will say notes from Dr. Katrinka   No changes today unless labs lead us  to make changes  If you have opening in time/finances- let us  know and we can get you plugged in with therapy  Recommended follow up: Return in about 6 months (around 03/18/2025) for followup or sooner if needed.Schedule b4 you leave.

## 2024-09-17 NOTE — Progress Notes (Signed)
 Phone [phone]   Subjective:  Patient presents today for their annual physical. Chief complaint-noted.   See problem oriented charting- ROS- full  review of systems was completed and negative except for topics noted under acute/chronic concerns  The following were reviewed and entered/updated in epic: Past Medical History:  Diagnosis Date   Acute deep vein thrombosis (DVT) of distal vein of right lower extremity (HCC) 07/22/2016   Acute medial meniscal tear 01/29/2015   Chicken pox    Chronic obstructive lung disease (HCC) 05/23/2019   Colon polyps    Complication of anesthesia    slow to wake up    COPD (chronic obstructive pulmonary disease) (HCC)    Genital warts    Multiple pulmonary nodules determined by computed tomography of lung 07/05/2017   CT 03/03/17  Right lower lobe nodularity described on the prior exam is less apparent today, favored to represent an area of scarring. other tiny nodules described on the prior are not readily apparent. No enlarging or dominant nodule identified > rec continue low dose annual  screening until 2023 per PCP (pt has fm hx lung ca)    Osteopenia    Shortness of breath dyspnea    with exertion was a smoker for many year per patient    Thyroid  disease    UTI (urinary tract infection)    Patient Active Problem List   Diagnosis Date Noted   Hyperlipidemia 06/28/2022    Priority: Medium    Aortic atherosclerosis 06/18/2021    Priority: Medium    Acquired hypothyroidism 07/17/2020    Priority: Medium    COPD GOLD I  07/05/2017    Priority: Medium    History of DVT (deep vein thrombosis) 07/22/2016    Priority: Low   Vitamin D deficiency 09/17/2024   Complication of anesthesia    Past Surgical History:  Procedure Laterality Date   BREAST BIOPSY     COLONOSCOPY     KNEE ARTHROSCOPY Right 01/29/2015   meniscus repair Procedure: RIGHT KNEE ARTHROSCOPY WITH DEBRIDEMENT  ;  Surgeon: Dempsey Moan, MD;  Location: [facility];  Service:  Orthopedics;  Laterality: Right;   THYROIDECTOMY, PARTIAL  1998    Family History  Problem Relation Age of Onset   Dementia Mother        lewy body dementia   Lung cancer Father    Prostate cancer Father        mets to bone   CVA Father        late 69s. smoker   Alcoholism Father    Alcoholism Paternal Grandfather    Colon cancer Neg Hx    Esophageal cancer Neg Hx    Rectal cancer Neg Hx    Stomach cancer Neg Hx     Medications- reviewed and updated Current Outpatient Medications  Medication Sig Dispense Refill   Calcium Carbonate-Vitamin D (CALCIUM + D PO) Take 1 tablet by mouth daily.      COVID-19 mRNA vaccine, Pfizer, (COMIRNATY) syringe Inject 0.3 mLs into the muscle once for 1 dose. 0.3 mL 0   estradiol (VIVELLE-DOT) 0.025 MG/24HR Apply 1 patch twice a week by transdermal route.     levothyroxine  (SYNTHROID ) 88 MCG tablet Take 1 tablet (88 mcg total) by mouth daily. 90 tablet 3   Multiple Vitamins-Minerals (PRESERVISION AREDS 2 PO) Take 1 tablet by mouth 2 (two) times daily.     progesterone (PROMETRIUM) 100 MG capsule Take 100 mg by mouth at bedtime.     tacrolimus (PROTOPIC)  0.1 % ointment Apply 1 Application topically as needed.     albuterol  (VENTOLIN  HFA) 108 (90 Base) MCG/ACT inhaler  (Patient not taking: Reported on 09/17/2024)     No current facility-administered medications for this visit.   Allergies-reviewed and updated No Known Allergies  Social History   Social History Narrative   Married 45 years in 2022. 2 kids. 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 Oropharynx normal Neck: no thyromegaly CV: RRR no murmurs rubs or gallops Lungs: CTAB no crackles, wheeze, rhonchi Abdomen: soft/nontender/nondistended/normal bowel sounds. No rebound or guarding.  Ext: no edema Skin: warm, dry Neuro: grossly normal, moves all extremities, PERRLA   Assessment and Plan   73 y.o. female presenting for annual physical.  Health Maintenance counseling: 1. Anticipatory guidance: Patient counseled regarding regular dental exams -q6 months, eye exams -,  avoiding smoking and second hand smoke, limiting alcohol to 1 beverage per day- 3 a week , no illicit drugs.   2. Risk factor reduction:  Advised patient of need for regular exercise and diet rich and fruits and vegetables to reduce risk of heart attack and stroke.  Exercise- not doing well with this- but states at least active. 150 minutes a week goal.   Diet/weight management-Down 7 lbs In last year- wants to be around 160 Wt Readings from Last 3 Encounters:  09/17/24 165 lb 12.8 oz (75.2 kg)  06/12/24 161 lb 6.4 oz (73.2 kg)  04/19/24 159 lb 6 oz (72.3 kg)  3. Immunizations/screenings/ancillary studies-recommended Shingrix at pharmacy as well as Tetanus, Diphtheria, and Pertussis (Tdap).  Flu shot today.  Sent COVID vaccination to pharmacy  Immunization History  Administered Date(s) Administered   Fluad Quad(high Dose 65+) 09/06/2020, 10/08/2021   Fluad Trivalent(High Dose 65+) 10/24/2023   INFLUENZA, HIGH DOSE SEASONAL PF 08/18/2022, 09/17/2024   Influenza,inj,Quad PF,6+ Mos 09/08/2019   Moderna Covid-19 Fall Seasonal Vaccine 68yrs & older 08/27/2022   Moderna Covid-19 Seasonal Vaccine 6 months thru 75years of age 88/09/2023   [facility] Covid-19 Vaccine Bivalent Booster 22yrs & up 07/06/2021   Moderna SARS-COV2 Booster Vaccination  10/01/2020, 03/21/2021   Moderna Sars-Covid-2 Vaccination 12/21/2019, 01/18/2020   Pneumococcal Conjugate-13 07/14/2020   Pneumococcal Polysaccharide-23 07/01/2014, 06/14/2016   Tdap 02/21/2014  4. Cervical cancer screening- past age based screening recommendations- no vaginal bleeding or discharge- still sees GYN as well  5. Breast cancer screening-  breast exam - with GYN-  and mammogram - 04/14/2023- scheduled in December as well 6. Colon cancer screening - 05/13/20 with 7 year repeat planned  7. Skin cancer screening- Dr. Jordan yearly [facility] dermatology advised regular sunscreen use. Denies worrisome, changing, or new skin lesions.  8. Birth control/STD check- postmenopausal/monogamous  9. Osteoporosis screening at 6- see below discussion.  Should be  low risk on hormone replacement through GYN- follows with GYN for DEXA  10. 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  We also mention therapy but would be costly and patient wanted to monitor.   -new stressors including husband falling down steps recently and multiple other issues around the house. He did not go to hospital and dental infection not listen to her. Mother slept well with seroquel and she wonders about that for her husband- he's not having hallucinations.  - she is interested in therapy but money and time away from husband would be tough on her- leans on her friends out of state  # Right hip pain-discussed that July visit with over 8 months of pain and they had mentioned MRI arthrogram through sports medicine as next up- pillow on right side in bed and in car helped and in chair- she's thrilled with improvement.    # COPD S:  Medication: Did not tolerate Stiolto or Breztri  but retried in 2025- not taking  -Improved with pulmonary rehab  A/P: breathing doing ok- continue to monitor     # History of DVT  -Still carries some bilateral ankle edema right worse than left-most recent venous duplex 02/04/2022 reassuring - thankfully swelling better lately with more leg movement   #hyperlipidemia-aortic atherosclerosis but no coronary artery calcium 11/30/22 S: Medication: None Lab Results  Component Value Date   CHOL 182 01/11/2023   HDL 87.10 01/11/2023   LDLCALC 78 01/11/2023   TRIG 83.0 01/11/2023   CHOLHDL 2 01/11/2023   A/P: update lipids- with how close she is to goal- unlikely to start medicine   #hypothyroidism S: compliant On thyroid  medication-levothyroxine  100 mcg A/P:hopefully stable- update tsh today. Continue current meds for now    #Osteopenia -  monitored by gynecology in the past-very mild and right femoral neck -1.3.  Compliant with vitamin D-history of deficiency.  Gets with GYN   Recommended follow up: Return in about 6 months (around 03/18/2025) for followup or sooner if needed.Schedule b4 you leave.  Lab/Order associations: fasting   ICD-10-CM   1. Preventative health care  Z00.00     2. Immunization due  Z23 Flu vaccine HIGH DOSE PF(Fluzone Trivalent)    CANCELED: Flu Vaccine Trivalent High Dose (Fluad)    3. Hyperlipidemia, unspecified hyperlipidemia type  E78.5 Comprehensive metabolic panel with GFR    CBC with Differential/Platelet    Lipid panel    4. Acquired hypothyroidism  E03.9 TSH    5. Vitamin D deficiency  E55.9 VITAMIN D 25 Hydroxy (Vit-D Deficiency, Fractures)      Meds ordered this encounter  Medications   COVID-19 mRNA vaccine, Pfizer, (COMIRNATY) syringe    Sig: Inject 0.3 mLs into the muscle once for 1 dose.    Dispense:  0.3 mL    Refill:  0    Approved at provider discretion. Product selection permitted.    Return precautions advised.  Garnette Lukes,  MD

## 2024-09-28 DIAGNOSIS — H353132 Nonexudative age-related macular degeneration, bilateral, intermediate dry stage: Secondary | ICD-10-CM | POA: Diagnosis not present

## 2024-10-28 DIAGNOSIS — H353132 Nonexudative age-related macular degeneration, bilateral, intermediate dry stage: Secondary | ICD-10-CM | POA: Diagnosis not present

## 2024-11-02 DIAGNOSIS — R319 Hematuria, unspecified: Secondary | ICD-10-CM | POA: Diagnosis not present

## 2024-11-02 DIAGNOSIS — R300 Dysuria: Secondary | ICD-10-CM | POA: Diagnosis not present

## 2024-11-02 DIAGNOSIS — Z6834 Body mass index (BMI) 34.0-34.9, adult: Secondary | ICD-10-CM | POA: Diagnosis not present

## 2024-11-02 DIAGNOSIS — Z01419 Encounter for gynecological examination (general) (routine) without abnormal findings: Secondary | ICD-10-CM | POA: Diagnosis not present

## 2024-11-09 ENCOUNTER — Ambulatory Visit: Payer: Self-pay

## 2024-11-09 NOTE — Telephone Encounter (Signed)
 FYI Only or Action Required?: FYI only for provider: ED advised.  Patient was last seen in primary care on 09/17/2024 by Katrinka Garnette KIDD, MD.  Called Nurse Triage reporting Breathing Problem.  Symptoms began several days ago.  Interventions attempted: Nothing.  Symptoms are: unchanged.  Triage Disposition: Go to ED Now (Notify PCP)  Patient/caregiver understands and will follow disposition?: Yes   Copied from CRM [id number]. Topic: Clinical - Red Word Triage >> Nov 09, 2024 11:38 AM Charolett L wrote: [facility] that prompted transfer to Nurse Triage: Tight Chest, hard time breathing Reason for Disposition  [1] MODERATE difficulty breathing (e.g., speaks in phrases, SOB even at rest, pulse 100-120) AND [2] NEW-onset or WORSE than normal  Answer Assessment - Initial Assessment Questions Advised ED now. Patient reports will have husband drive.  Advised 911 if symptoms worsen, patient verbalized understanding.   1. RESPIRATORY STATUS: Describe your breathing? (e.g., wheezing, shortness of breath, unable to speak, severe coughing)      Sob rest/talking and exertion, chest tightness, dry cough. Patient does not have rescue inhaler. Denies wheezing chest pain 2. ONSET: When did this breathing problem begin?      3 days ago, progressing 3. PATTERN Does the difficult breathing come and go, or has it been constant since it started?      Comes and goes 4. SEVERITY: How bad is your breathing? (e.g., mild, moderate, severe)      Moderate to severe 6. CARDIAC HISTORY: Do you have any history of heart disease? (e.g., heart attack, angina, bypass surgery, angioplasty)      no 7. LUNG HISTORY: Do you have any history of lung disease?  (e.g., pulmonary embolus, asthma, emphysema)     copd 8. CAUSE: What do you think is causing the breathing problem?      unsure 9. OTHER SYMPTOMS: Do you have any other symptoms? (e.g., chest pain, cough, dizziness, fever, runny nose)      Denies runny nose congestion chest pain 10. O2 SATURATION MONITOR:  Do you use an oxygen saturation monitor (pulse oximeter) at home? If Yes, ask: What is your reading (oxygen level) today? What is your usual oxygen saturation reading? (e.g., 95%) Not checked  Protocols used: Breathing Difficulty-A-AH

## 2024-11-09 NOTE — Telephone Encounter (Signed)
 Pt going to ED. Dr. Katrinka will review triage notes.

## 2024-11-28 ENCOUNTER — Other Ambulatory Visit: Payer: Self-pay | Admitting: Family Medicine

## 2024-12-05 ENCOUNTER — Ambulatory Visit: Payer: Self-pay

## 2024-12-05 NOTE — Telephone Encounter (Signed)
 Ed advised for patient. Dr. Katrinka will review triage notes.

## 2024-12-05 NOTE — Telephone Encounter (Signed)
 Noted- will await Emergency Department evaluation info

## 2024-12-05 NOTE — Telephone Encounter (Signed)
 FYI Only or Action Required?: FYI only for provider: ED advised.  Patient was last seen in primary care on 09/17/2024 by Katrinka Garnette KIDD, MD.  Called Nurse Triage reporting Shortness of Breath.  Symptoms began several days ago.  Interventions attempted: Rest, hydration, or home remedies.  Symptoms are: gradually worsening.  Triage Disposition: Go to ED Now (Notify PCP)  Patient/caregiver understands and will follow disposition?: Yes  Copied from CRM [id number]. Topic: Clinical - Red Word Triage >> Dec 05, 2024  8:27 AM Harlene ORN wrote: Red Word that prompted transfer to Nurse Triage: been ill for 7 days now. Has COPD and her chest is getting tight. Having trouble breathing. Reason for Disposition  History of prior blood clot in leg or lungs (i.e., deep vein thrombosis, pulmonary embolism)  Answer Assessment - Initial Assessment Questions A week ago- Sneezing and sore throat Sore throat started to go away then came back  Started having SOB with Activity about 4 days ago. 2 days ago she noticed that she has a hard time pulling a deep breath and her chest feels heavy and tight at rest. Denies chest pain but her chest feels heavy and pressure even at rest/without coughing. Coughing is wet and sound croupy. Denies wheezing but does labor.   Current inhalers have expired. Does have hx of DVT s/p knee surgery years ago and is off of blood thinners for a few years.   Advised ED for SOB, Chest heavy, hx of DVT, and feeling of impending doom. Patient will go to ED now and has husband bring her    1. RESPIRATORY STATUS: Describe your breathing? (e.g., wheezing, shortness of breath, unable to speak, severe coughing)      SOB with activity- 2. ONSET: When did this breathing problem begin?      Breathing changed about 3-4days- worse last 2 days  3. PATTERN Does the difficult breathing come and go, or has it been constant since it started?      Heavy and tight even at rest and SOB with  acitivty 4. SEVERITY: How bad is your breathing? (e.g., mild, moderate, severe)      moderate 5. RECURRENT SYMPTOM: Have you had difficulty breathing before? If Yes, ask: When was the last time? and What happened that time?      COPD 6. CARDIAC HISTORY: Do you have any history of heart disease? (e.g., heart attack, angina, bypass surgery, angioplasty)      DVt s/p knee surgery, aortic athero  7. LUNG HISTORY: Do you have any history of lung disease?  (e.g., pulmonary embolus, asthma, emphysema)     COPD 8. CAUSE: What do you think is causing the breathing problem?      Copd flare 9. OTHER SYMPTOMS: Do you have any other symptoms? (e.g., chest pain, cough, dizziness, fever, runny nose)     Cough, sneezing, sore throat, feeling on un-easiness/heavy chest 10. O2 SATURATION MONITOR:  Do you use an oxygen saturation monitor (pulse oximeter) at home? If Yes, ask: What is your reading (oxygen level) today? What is your usual oxygen saturation reading? (e.g., 95%)       97% with HR 90 (high for her)  Protocols used: Breathing Difficulty-A-AH

## 2025-03-18 ENCOUNTER — Ambulatory Visit: Admitting: Family Medicine

## 2025-09-18 ENCOUNTER — Encounter: Admitting: Family Medicine
